# Patient Record
Sex: Female | Born: 1955 | Race: White | Hispanic: No
Health system: Midwestern US, Community
[De-identification: ages and names within clinical notes are randomized; demographics above are authoritative.]

## PROBLEM LIST (undated history)

## (undated) DIAGNOSIS — B019 Varicella without complication: Secondary | ICD-10-CM

## (undated) DIAGNOSIS — F1020 Alcohol dependence, uncomplicated: Secondary | ICD-10-CM

## (undated) DIAGNOSIS — F329 Major depressive disorder, single episode, unspecified: Secondary | ICD-10-CM

## (undated) DIAGNOSIS — R32 Unspecified urinary incontinence: Secondary | ICD-10-CM

## (undated) DIAGNOSIS — T7840XA Allergy, unspecified, initial encounter: Secondary | ICD-10-CM

## (undated) DIAGNOSIS — F32A Depression, unspecified: Secondary | ICD-10-CM

## (undated) DIAGNOSIS — E079 Disorder of thyroid, unspecified: Secondary | ICD-10-CM

## (undated) DIAGNOSIS — K219 Gastro-esophageal reflux disease without esophagitis: Secondary | ICD-10-CM

## (undated) DIAGNOSIS — L309 Dermatitis, unspecified: Secondary | ICD-10-CM

## (undated) DIAGNOSIS — E785 Hyperlipidemia, unspecified: Secondary | ICD-10-CM

## (undated) HISTORY — DX: Hyperlipidemia, unspecified: E78.5

## (undated) HISTORY — DX: Disorder of thyroid, unspecified: E07.9

## (undated) HISTORY — DX: Unspecified urinary incontinence: R32

## (undated) HISTORY — DX: Allergy, unspecified, initial encounter: T78.40XA

## (undated) HISTORY — DX: Varicella without complication: B01.9

## (undated) HISTORY — DX: Alcohol dependence, uncomplicated: F10.20

## (undated) HISTORY — DX: Major depressive disorder, single episode, unspecified: F32.9

## (undated) HISTORY — DX: Gastro-esophageal reflux disease without esophagitis: K21.9

## (undated) HISTORY — DX: Dermatitis, unspecified: L30.9

## (undated) HISTORY — DX: Depression, unspecified: F32.A

---
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
## 2011-01-17 LAB — TSH WITH REFLEX: TSH: 0.94 mIU/L (ref 0.35–5.50)

## 2012-02-28 ENCOUNTER — Inpatient Hospital Stay: Admit: 2012-02-28 | Disposition: A | Discharge status: Home / Self Care | Admitting: Pulmonary Disease

## 2012-02-28 LAB — POCT TROPONIN
POC Troponin I: 0.01 ng/mL (ref 0.00–0.10)
POC Troponin I: 0.04 ng/mL (ref 0.00–0.10)

## 2012-02-28 LAB — CBC WITH AUTO DIFFERENTIAL
Absolute Eos #: 0.1 10*3/uL (ref 0.0–0.4)
Absolute Lymph #: 2.4 10*3/uL (ref 1.0–4.8)
Absolute Mono #: 0.4 10*3/uL (ref 0.1–1.2)
Basophils Absolute: 0.1 10*3/uL (ref 0.0–0.2)
Basophils: 1 % (ref 0–2)
Eosinophils %: 1 % (ref 1–4)
Hematocrit: 40.6 % (ref 36–46)
Hemoglobin: 13.6 g/dL (ref 12.0–16.0)
Lymphocytes: 27 % (ref 24–44)
MCH: 28.9 pg (ref 26–34)
MCHC: 33.6 g/dL (ref 31–37)
MCV: 86.2 fL (ref 80–100)
MPV: 9.7 fL (ref 6.0–12.0)
Monocytes: 5 % (ref 2–11)
Platelets: 280 10*3/uL (ref 140–450)
RBC: 4.71 m/uL (ref 4.0–5.2)
RDW: 13.9 % (ref 12.5–15.4)
Seg Neutrophils: 66 % (ref 36–66)
Segs Absolute: 5.9 10*3/uL (ref 1.8–7.7)
WBC: 8.9 10*3/uL (ref 3.5–11.0)

## 2012-02-28 LAB — BASIC METABOLIC PANEL
Anion Gap: 12 mmol/L (ref 8–16)
BUN: 22 mg/dL — ABNORMAL HIGH (ref 6–20)
CO2: 26 mmol/L (ref 20–31)
Calcium: 9.7 mg/dL (ref 8.6–10.4)
Chloride: 109 mmol/L (ref 98–110)
Creatinine: 0.84 mg/dL (ref 0.4–1.0)
GFR African American: >60 mL/min (ref >60)
GFR Non-African American: >60 mL/min (ref >60)
Glucose: 90 mg/dL (ref 74–106)
Potassium: 4.2 mmol/L (ref 3.5–5.1)
Sodium: 142 mmol/L (ref 136–145)

## 2012-02-28 LAB — PLATELET FUNCTION TEST
COLL/EPI Clos Time: 161 s (ref 85–172)
Collagen Adenosine-5'-Diphosphate (Adp) Time: 71 s (ref 67–112)
Platelet Function Interp: NORMAL

## 2012-02-28 LAB — CALCIUM, IONIZED: Calcium, Ionized: 4.66 mg/dL (ref 4.50–5.30)

## 2012-02-28 LAB — POCT CREATININE: POC Creatinine: 1 mg/dL (ref 0.6–1.4)

## 2012-02-28 MED ORDER — ASPIRIN 81 MG PO TBEC
81 MG | Freq: Every day | ORAL | Status: DC
Start: 2012-02-28 — End: 2012-02-28

## 2012-02-28 MED ADMIN — ondansetron (ZOFRAN) injection 4 mg: INTRAVENOUS | @ 21:00:00 | NDC 00409112012

## 2012-02-28 MED FILL — FAMOTIDINE 10 MG/ML IV SOLN: 10 MG/ML | INTRAVENOUS | Qty: 2

## 2012-02-28 MED FILL — ASPIRIN EC 81 MG PO TBEC: 81 MG | ORAL | Qty: 1

## 2012-02-28 MED FILL — ASPIRIN 300 MG RE SUPP: 300 MG | RECTAL | Qty: 1

## 2012-02-28 MED FILL — LOVENOX 40 MG/0.4ML SC SOLN: 40 MG/0.4ML | SUBCUTANEOUS | Qty: 0.4

## 2012-02-28 NOTE — ED Notes (Signed)
Responded to stroke alert. Met pt and her friend/partner?, who advised that she had called their son. 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 Provided support to pt, as well as to friend at time of assessment and when pt was in CT. They state they have no further needs at this time.
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
## 2012-02-28 NOTE — H&P (Addendum)
Internal Medicine  History and Physical    Patient:  Ashley Burgess  MRN: [id number]    CHIEF COMPLAINT:  Slurred speech/right sided weakness    History Obtained From:  patient  PCP: Luiz Blare, MD    HISTORY  Ashley Burgess is a 56 y.o. female who presents With two-week history of substernal chest tightness, no pain, intermittent, Nonradiating, with associated shortness of breath, nausea, fatigue. Patient also reports a one hour prior to arrival having garbled/Slurred speech stating that she was only able to get out 3 words per sentence. Patient's next partner in the room confirms patient's speech is not normal. Patient also reports approximately one hour prior to arrival during the onset of the garbled speech that she felt weakness and both legs and was unable to stand. Patient reports that her speech is slightly improved in str ength has returned to her legs.  She has been having intermittent chest pressure on daily basis for the past two weeks. There is no headache or focal motor, sensory, bulbar or visual complaint . Patient does take daily baby aspirin . BP 131/71 . Testing Head CT normal . CT perfusion normal         Past Medical History:        Diagnosis Date   ??? Hyperlipidemia    ??? Depression    ??? Thyroid disease        Past Surgical History:    History reviewed. No pertinent past surgical history.    Medications Prior to Admission:    Prior to Admission medications    Medication Sig Start Date End Date Taking? Authorizing Provider   aspirin 81 MG tablet Take 81 mg by mouth daily.   Yes Historical Provider, MD   escitalopram (LEXAPRO) 20 MG tablet Take 20 mg by mouth daily.   Yes Historical Provider, MD       Allergies:  Review of patient's allergies indicates no known allergies.    Social History:   TOBACCO:   has no tobacco history on file.  ETOH:   reports that she drinks about 0.6 ounces of alcohol per week.  OCCUPATION:      Family History:       Problem Relation Age of Onset   ??? Stroke  Father    ??? Stroke Paternal Aunt        REVIEW OF SYSTEMS:  Review of Systems   Constitutional: Negative for fever and diaphoresis.   HENT: Negative for ear pain, congestion, sore throat, rhinorrhea, neck pain and tinnitus.   Eyes: Negative for pain, discharge and visual disturbance.   Respiratory: Positive for chest tightness. Negative for cough, shortness of breath and wheezing.   Cardiovascular: Negative for chest pain and palpitations.   Gastrointestinal: Positive for nausea and vomiting. Negative for abdominal pain, diarrhea and constipation.   Genitourinary: Negative for dysuria, hematuria, vaginal bleeding and vaginal discharge.   Musculoskeletal: Negative for back pain and arthralgias.   Skin: Negative for pallor and rash.   Neurological: Positive for speech difficulty and weakness. Negative for dizziness, numbness and headaches.   Psychiatric/Behavioral: Negative for hallucinations and confusion      Physical Exam:    Vitals: BP 134/73   Pulse 59   Temp(Src) 98.1 ??F (36.7 ??C) (Oral)   Resp 16   Ht 5\' 6"  (1.676 m)   Wt 198 lb 6.6 oz (90 kg)   BMI 32.04 kg/m2   SpO2 97%  Neurological Examination   Constitutional .General exam  well groomed   Ears /Nose/Throat: external ear .Normal exam   Neck and thyroid .Normal size   Respiratory .Breathsounds clear bilaterally   Cardiovascular: Auscultation of heart with regular rate and rhythm and normal heart sounds   Musculoskeletal. Muscle bulk and tone normal   Muscle strength full motor exam on full effort otherwise with some mild giveway in bilateral lower extremities   No dysmetria or dysdiadokinesis   No tremor   Normal fine motor   Orientation Alert and oriented x 3   Language process and speech normal . No aphasia   Cranial nerve 2 normal acuety and visual fields   Cranial nerve 3, 4 and 6 .Extraocular muscles are intact . Pupils are equal and reactive   Cranial nerve 5 .Normal strength of masseter and temporalis . Intact corneal reflex. Normal facial sensation    Cranial nerve 7 normal exam   Cranial nerve 8. Grossly intact hearing   Cranial nerve 9 and 10. Symmetric palate elevation   Cranial nerve 11 , 5 out of 5 strength   Cranial Nerve 12 midline tongue . No atrophy   Sensation .Normal proprioception . Intact Vibration . Normal pinprick and light touch   Deep Tendon Reflexes normal   Plantar response flexor bilaterally      CBC:   Recent Labs      02/28/12   1618   WBC  8.9   HGB  13.6   PLT  280     BMP:    Recent Labs      02/28/12   1549  02/28/12   1618   NA   --   142   K   --   4.2   CL   --   109   CO2   --   26   BUN   --   22*   CREATININE  1.0  0.84   GLUCOSE   --   90     Troponin:   Recent Labs      02/28/12   1535  02/28/12   1629  02/28/12   1933   TROPONINI  0.01  0.04  <0.01     BNP:   Recent Labs      02/28/12   1933   BNP  26       Assessment and Plan   Generalized weakness/Speech disturbance- r/o TIA  Chest pain  R/o ACS      Plan -  MRI of Head  Await CTA of head and neck report  Cardiac 2 D echo   EEG  Lipid profile, ESR   Started on ASA/statin  Cardiology consultation per neurology  Fall/seizure precautions  Serial neurochecks  Bedside swallow eval,  Advance diet as tolerated  Keep SBP between 140-180 mm Hg.  Will continue to follow      Ferdie Ping, MD    Attending Physician Statement  I have discussed the care of Ashley Burgess, including pertinent history and exam findings with the resident. I have reviewed the key elements of all parts of the encounter with the resident. I have seen and examined the patient with the resident.  I agree with the assessment and plan and status of the problem list as documented.  CT brain, MRI and carotid scans results seen  Neurology note seen  Currently no neuro deficit on exam and no localizing signs or symptoms  Addiitionally I recommend:  ?? DC NPO  ?? Ambulate and PT  ??  Follow up echo, she is seen by cardiology for chest heaviness on exertion and will follow up the results  ?? Likely DC if cardiology and  neurology not planning any intervention and if no further work up by cardiology    Luan Moore, MD  02/29/2012 11:59 PM

## 2012-02-28 NOTE — Progress Notes (Signed)
Paged to visit per pt's request. This chaplain had seen pt in ED. Pt talked about what happened today, as well as her family situation, etc. Provided support, presence, and prayer. Pt asked for Bible, will chaplain will deliver. Also asked chaplain to all her pastor (at [facility] [facility] [employer], [place]), which chaplain will do.

## 2012-02-28 NOTE — H&P (Signed)
Internal Medicine  History and Physical    Patient:  Ashley Burgess  MRN: [id number]    CHIEF COMPLAINT:  Weakness and slurred speech    History Obtained From:  patient  PCP: Luiz Blare, MD    HISTORY OF PRESENT ILLNESS:   The patient is a 56 y.o. female who presents with weakness and slurred speech, which occurred at around [facility] today. She has had a 3-4 week history of fatigue, shortness of breath, and pressure in her chest. The pressure presents after exertion (climbing 2 flights of stairs) and resolves upon rest. She has noticed the chest pressure and SOB increase in frequency over the past few weeks. Today, she experienced sudden bilateral muscle weakness and slurring of speech. She denies asymmetrical dropping of her mouth, and describes the slurring as more of a slowing in her speech. Denies feeling a noticeable difference in strength of R vs L side. Patient is left-handed. Denies numbness/tingling.    Patient has never smoked. Drinks 1-2 glasses of wine each night.    Does not report having a history of heart disease, and was last seen by her PCP 3-4 weeks ago for a routine checkup.    Reports that her father had "mini-strokes" and her mother died at a young age.    Patient works as a Programme researcher, broadcasting/film/video at [employer].    Currently takes 5 medications at home: baby aspirin, simvastatin, synthroid, concerta (for adhd), and lexapro.    Denies fever/HA/dizziness/nausea/vomiting/vision changes.    Reports no past history of diabetes.    Pt is NPO.    Past Medical History:        Diagnosis Date   ??? Hyperlipidemia    ??? Depression    ??? Thyroid disease        Past Surgical History:    History reviewed. No pertinent past surgical history.    Medications Prior to Admission:    Prior to Admission medications    Medication Sig Start Date End Date Taking? Authorizing Provider   aspirin 81 MG tablet Take 81 mg by mouth daily.   Yes Historical Provider, MD   escitalopram (LEXAPRO) 20 MG tablet Take 20 mg  by mouth daily.   Yes Historical Provider, MD       Allergies:  Review of patient's allergies indicates no known allergies.    Social History:   TOBACCO:   has no tobacco history on file.  ETOH:   reports that she drinks about 0.6 ounces of alcohol per week.  OCCUPATION:      Family History:       Problem Relation Age of Onset   ??? Stroke Father    ??? Stroke Paternal Aunt        REVIEW OF SYSTEMS:  Negative except for chief complaint.    Physical Exam:    Vitals: BP 134/73   Pulse 59   Temp(Src) 98.1 ??F (36.7 ??C) (Oral)   Resp 16   Ht 5\' 6"  (1.676 m)   Wt 198 lb 6.6 oz (90 kg)   BMI 32.04 kg/m2   SpO2 97%  General appearance: alert, appears stated age and cooperative  Skin: Skin color, texture, turgor normal. No rashes or lesions  HEENT: Head: Normocephalic, no lesions, without obvious abnormality.  Neck: no adenopathy, no carotid bruit, no JVD, supple, symmetrical, trachea midline and thyroid not enlarged, symmetric, no tenderness/mass/nodules  Lungs: clear to auscultation bilaterally  Heart: regular rate and rhythm, S1, S2 normal, no murmur,  click, rub or gallop  Abdomen: soft, non-tender; bowel sounds normal; no masses,  no organomegaly  Extremities: extremities normal, atraumatic, no cyanosis or edema  Neurologic: Mental status: Alert, oriented, thought content appropriate. Cranial nerves II-XII grossly intact. No facial asymmetry or droop. Upper and lower extremity strength 5/5 on the L, and slightly weaker on the R. Patellar reflex slightly hyperreflexive (3/4) on the L. Upper and lower extremity DTRs are otherwise WNL. Babinski sign WNL bilaterally. Rapid alternating movements of hands WNL.     CBC:   Recent Labs      02/28/12   1618   WBC  8.9   HGB  13.6   PLT  280     BMP:    Recent Labs      02/28/12   1549  02/28/12   1618   NA   --   142   K   --   4.2   CL   --   109   CO2   --   26   BUN   --   22*   CREATININE  1.0  0.84   GLUCOSE   --   90     Hepatic: No results found for this basename: AST, ALT,  ALB, BILITOT, ALKPHOS,  in the last 72 hours  Troponin:   Recent Labs      02/28/12   1535  02/28/12   1629   TROPONINI  0.01  0.04     BNP: No results found for this basename: BNP,  in the last 72 hours  Lipids: No results found for this basename: CHOL, HDL, LDLCALCU,  in the last 72 hours  ABGs:   No results found for this basename: PHART, PO2ART, PCO2ART     INR: No results found for this basename: INR,  in the last 72 hours  -----------------------------------------------------------------  PA/lat CXR:  CTA Head & Neck W WO Contrast 02/28/12:  Neck: Both common carotid, internal and external carotid   arteries are normal with no evidence of focal stenosis, aneurysm or dissection. Note is made of bilateral hairpin configuration of the mid segment of the internal carotid arteries which are markedly tortuous, left worse than right. The intracranial segments are unremarkable. Both vertebral arteries are symmetric and normal in caliber.    CTA brain: The anterior, middle and posterior cerebral arteries are normal. Normal basilar artery. No peripheral vascular abnormalities identified.    Thyroid, submandibular and parotid glands are normal. The retropharyngeal and parapharyngeal spaces unremarkable. The nasopharynx and oropharynx are normal. Both lung apices are clear. No bony pathology.    IMPRESSION: Unremarkable examination. No stenosis, dissection, thrombus or aneurysm involving the carotid and intracranial circulation    Portable CXR 02/28/12:  The heart is not enlarged. Mediastinum appears within normal limits. There is no significant vascular congestion, focal lung consolidation, pleural effusion, or pneumothorax. Bones and soft tissues about the chest appear grossly unremarkable.    IMPRESSION:   No radiographic evidence of acute cardiopulmonary disease.    CT Brain perfusion 02/28/12:  Noncontrast CT: There is no evidence for shift of midline structures, mass effect or compression of the ventricles noted. No  acute intra-or extra-axial hemorrhage is seen. No abnormal fluid collections are identified. The basal cisterns are patent. Posterior fossa structures demonstrate no evidence for mass lesions.    CT perfusion: Perfusion parametric maps demonstrate no definite evidence for elevated mean transit time, decreased cerebral blood flow or decreased cerebral blood volume.  IMPRESSION: No definite evidence for acute ischemic infarct noted. No definite evidence for a salvageable penumbra seen. No evidence for intracranial hemorrhage seen.    CT Head WO contrast 02/28/12: in process      Assessment and Plan   Assessment:  1. TIA  2. Hyperlipidemia  3. Hx of adhd and depression    Plan:  1. Monitor for signs of stroke: neuro checks   2. Trend troponins/CK-MB  3. Echo  4. F/u CT head w/o contrast  5. Anticoagulation  6. MRI brain  7. Consult cardiology and neurology  8. Lipid panel  9. HbA1C  10. Sed rate  11. CBC and BMP  12. Nursing swallow test  13. Seizure precautions      Regina Eck, Medical Student

## 2012-02-28 NOTE — Consults (Signed)
The condition is 56 yo wf with weakness, slurring along with fatigue. 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 She by 3 PM developed some slowness and slurring of her speech with slow gait with short steps brought to [facility]. Patient reports also some chest pressure. She has been having intermittent chest pressure on daily basis for the past two weeks. There is no headache or focal motor, sensory, bulbar or visual complaint . Patient does take daily baby aspirin . BP 131/71 . Testing Head CT normal . CT perfusion normal      Past Medical History   Diagnosis Date   ??? Hyperlipidemia        History reviewed. No pertinent past surgical history.    Family History   Problem Relation Age of Onset   ??? Stroke Father    ??? Stroke Paternal Aunt        History     Social History   ??? Marital Status: Married     Spouse Name: N/A     Number of Children: N/A   ??? Years of Education: N/A     Social History Main Topics   ??? Smoking status: None   ??? Smokeless tobacco: None   ??? Alcohol Use: 0.6 oz/week     1 Glasses of wine per week   ??? Drug Use: No   ??? Sexually Active: Yes -- Female, Female partner(s)     Other Topics Concern   ??? None     Social History Narrative   ??? None       Current Facility-Administered Medications   Medication Dose Route Frequency Provider Last Rate Last Dose   ??? ondansetron (ZOFRAN) injection 4 mg  4 mg Intravenous Once Coralie Carpen, DO         Current Outpatient Prescriptions   Medication Sig Dispense Refill   ??? aspirin 81 MG tablet Take 81 mg by mouth daily.       ??? escitalopram (LEXAPRO) 20 MG tablet Take 20 mg by mouth daily.           No Known Allergies    Filed Vitals:    02/28/12 1516   BP: 134/77   Pulse: 64   Temp: 98.1 ??F (36.7 ??C)   Resp: 20     Admission weight: 190 lb (86.183 kg)    Neurological Examination  Constitutional .General exam well groomed   Ears /Nose/Throat: external ear .Normal exam  Neck and thyroid  .Normal size  Respiratory .Breathsounds clear bilaterally  Cardiovascular: Auscultation of heart with regular rate and rhythm and normal heart sounds  Musculoskeletal. Muscle bulk and tone normal                                                           Muscle strength full motor exam on full effort otherwise with some mild giveway in bilateral lower extremities  No dysmetria or dysdiadokinesis  No tremor   Normal fine motor  Orientation Alert and oriented x 3   Language process and speech normal . No aphasia   Cranial nerve 2 normal acuety and visual fields  Cranial nerve 3, 4 and 6 .Extraocular muscles are intact . Pupils are equal and reactive   Cranial nerve 5 .Normal strength of masseter and temporalis . Intact corneal reflex. Normal facial sensation  Cranial nerve 7 normal exam   Cranial nerve 8. Grossly intact hearing   Cranial nerve 9 and 10. Symmetric palate elevation   Cranial nerve 11 , 5 out of 5 strength   Cranial Nerve 12 midline tongue . No atrophy  Sensation .Normal proprioception . Intact Vibration . Normal pinprick and light touch   Deep Tendon Reflexes normal  Plantar response flexor bilaterally    Assessment :    Generalized weakness , speech disturbance, lethargy along with chest pain. Rule out TIA although suspect lower incidence with more diffuse nonfocal complaints      Plan:    MRI of Head, EEG . Await CTA of head and neck report. Cardiac 2 D echo . Lipid profile, ESR . Cardiology consultation . ASA 325 mg po qd

## 2012-02-28 NOTE — ED Notes (Signed)
Pt back from CT.    Araceli Bouche, RN  02/28/12 1622

## 2012-02-28 NOTE — ED Notes (Signed)
Stroke alert called    Belva ChimesMonica M Gotti Alwin, RN  02/28/12 1537

## 2012-02-28 NOTE — Plan of Care (Signed)
Problem: Falls - Risk of  Intervention: Fall risk assessment  C/o weakness generalized and rt hand more so than left    Goal: Absence of falls  Outcome: Ongoing  Instructed on fall risk and to use help    Problem: Risk for Impaired Skin Integrity  Goal: Tissue integrity - skin and mucous membranes  Structural intactness and normal physiological function of skin and  mucous membranes.   Intervention: SKIN ASSESSMENT  No breakdown or open areas noted

## 2012-02-28 NOTE — Procedures (Signed)
Nursing Swallow Screen      **Complete swallow screen BEFORE any PO intake, including medications**      Have patient in highest tolerable sitting position.  Have oral suction equipment available at bedside.  Explain testing and rationale to patient.       Is the patient alert and can follow simple commands? y If NO stop   Does the patient have a clear strong voice and can vocalize on command? y If NO stop   Is the patient's speech SEVERELY slurred or garbled? n If YES stop   Can the patient voluntarily cough two times? y If NO stop   Is the patient able to maintain own secretions? y If NO stop   Is the patient is able to swallow 60ml of water without throat clearing, choking, gurgling, coughing, dribbling, or drooling? n If NO stop   Is the patient able to swallow 60ml of water from a STRAW without throat clearing, choking, gurgling, coughing, dribbling, or drooling?  If NO stop       If patient is unable to complete swallow screen successfully, patient should remain NPO until further swallow evaluation is completed by speech therapy.    If patient is able to complete screen, notify physician to obtain diet and PO medication orders.         Result:failed

## 2012-02-28 NOTE — ED Provider Notes (Signed)
[facility]  eMERGENCY dEPARTMENT eNCOUnter  Resident    Pt Name: Ashley Burgess  MRN: [id number]  Birthdate 18-Sep-1955  Date of evaluation: 02/28/2012    CHIEF COMPLAINT       Chief Complaint   Patient presents with   ??? Chest Pain     Pt states that she is here today due to having pain in her chest for the last couple of weeks.  Pt states that she is also feeling weak. Pt is having garbled speach as well.  Pt states that the hard/garbled speach started with in the last hour.         HISTORY OF PRESENT ILLNESS    SHALENE GALLEN is a 56 y.o. female who presents With two-week history of substernal chest tightness, no pain, intermittent, Nonradiating, with associated shortness of breath, nausea, fatigue. Patient also reports a one hour prior to arrival having garbled/Slurred speech stating that she was only able to get out 3 words per sentence.  Patient's next partner in the room confirms patient's speech is not normal.  Patient also reports approximately one hour prior to arrival during the onset of the garbled speech that she felt weakness and both legs and was unable to stand.  Patient reports that her speech is slightly improved in strength has returned to her legs.      REVIEW OF SYSTEMS       Review of Systems   Constitutional: Negative for fever and diaphoresis.   HENT: Negative for ear pain, congestion, sore throat, rhinorrhea, neck pain and tinnitus.    Eyes: Negative for pain, discharge and visual disturbance.   Respiratory: Positive for chest tightness. Negative for cough, shortness of breath and wheezing.    Cardiovascular: Negative for chest pain and palpitations.   Gastrointestinal: Positive for nausea and vomiting. Negative for abdominal pain, diarrhea and constipation.   Genitourinary: Negative for dysuria, hematuria, vaginal bleeding and vaginal discharge.   Musculoskeletal: Negative for back pain and arthralgias.   Skin: Negative for pallor and rash.   Neurological: Positive  for speech difficulty and weakness. Negative for dizziness, numbness and headaches.   Psychiatric/Behavioral: Negative for hallucinations and confusion.          PAST MEDICAL HISTORY    has a past medical history of Hyperlipidemia; Depression; and Thyroid disease.    SURGICAL HISTORY     Denies surgical history    CURRENT MEDICATIONS       Current Discharge Medication List      CONTINUE these medications which have NOT CHANGED    Details   aspirin 81 MG tablet Take 81 mg by mouth daily.      escitalopram (LEXAPRO) 20 MG tablet Take 20 mg by mouth daily.             ALLERGIES      has no known allergies.    FAMILY HISTORY     has no family status information on file.    family history includes Stroke in her father and paternal aunt.    SOCIAL HISTORY      reports that she drinks about 0.6 ounces of alcohol per week. She reports that she does not use illicit drugs.    PHYSICAL EXAM     INITIAL VITALS:  height is 5\' 6"  (1.676 m) and weight is 198 lb 6.6 oz (90 kg). Her oral temperature is 98.1 ??F (36.7 ??C). Her blood pressure is 155/67 and her pulse is 60. Her  respiration is 14 and oxygen saturation is 97%.        Physical Exam   Constitutional: She is oriented to person, place, and time. She appears well-developed. No distress.   HENT:   Head: Normocephalic and atraumatic.   Eyes: EOM are normal. Pupils are equal, round, and reactive to light. Right eye exhibits no discharge. Left eye exhibits no discharge. No scleral icterus.   Neck: Normal range of motion. No JVD present. No tracheal deviation present.   Cardiovascular: Normal heart sounds.  Exam reveals no gallop and no friction rub.    No murmur heard.  Pulmonary/Chest: No respiratory distress. She has no wheezes. She has no rales. She exhibits no tenderness.   Abdominal: Soft. Bowel sounds are normal. She exhibits no distension and no mass. There is no tenderness. There is no rebound and no guarding.   Musculoskeletal: She exhibits no edema and no tenderness.    Neurological: She is alert and oriented to person, place, and time. She displays no atrophy and no tremor. No cranial nerve deficit or sensory deficit. She exhibits normal muscle tone. She displays no seizure activity. GCS eye subscore is 4. GCS verbal subscore is 5. GCS motor subscore is 6. She displays no Babinski's sign on the right side. She displays no Babinski's sign on the left side.   Slow speech noted   Skin: Skin is warm and dry. She is not diaphoretic.   Psychiatric: Her behavior is normal. Judgment normal.         DIFFERENTIAL DIAGNOSIS/MDM:   CVA  TIA  Chest pain    DIAGNOSTIC RESULTS     EKG: All EKG's are interpreted by the Emergency Department Physician who either signs or Co-signs this chart in the absence of a cardiologist.    EKG Interpretation    Interpreted by emergency department physician    Rhythm: normal sinus   Rate: normal  Axis: normal  Ectopy: none  Conduction: normal  ST Segments: normal  T Waves: normal  Q Waves: none    EKG  Impression: normal  EKG      RADIOLOGY:   I directly visualized the following  images and reviewed the radiologist interpretations:  CT BRAIN PERFUSION    Final Result:        XR CHEST PORTABLE    Final Result:        CTA HEAD NECK W WO CONTRAST    Final Result:        CTA NECK W CONTRAST    (Canceled)   CT HEAD WO CONTRAST    (Results Pending)   MRI BRAIN W WO CONTRAST    (Results Pending)       Chest portable, radiology interpretation: No radiographic evidence of acute cardiopulmonary disease.  CT brain perfusion, radiology interpretation:IMPRESSION: No definite evidence for acute ischemic infarct noted. No   definite evidence for a salvageable penumbra seen. No evidence for   intracranial hemorrhage seen.  CTA neck, radiology interpreted:IMPRESSION: Unremarkable examination. No stenosis, dissection,   thrombus or aneurysm involving the carotid and intracranial circulation    CT head without contrast interpretation included a CT perfusion  report.        LABS:  Labs Reviewed   BASIC METABOLIC PANEL - Abnormal; Notable for the following:     BUN 22 (*)     All other components within normal limits   CBC WITH AUTO DIFFERENTIAL   PLATELET FUNCTION TEST   SEDIMENTATION RATE   LIPID PANEL  HEMOGLOBIN A1C   LIPID PANEL   TROPONIN I   TROPONIN I   BRAIN NATRIURETIC PEPTIDE   CBC WITH AUTO DIFFERENTIAL   URINALYSIS WITH MICROSCOPIC   TSH   MAGNESIUM   CALCIUM, IONIZED   URINE DRUG SCREEN   CK ISOENZYMES   CK ISOENZYMES   POCT TROPONIN   POC CG8: NA, K, ICA, GLU, HCT/HGB(CALC), BLOOD GASES   POCT CREATININE   POCT TROPONIN   POCT TROPONIN   POCT CREATININE   POCT TROPONIN     Reviewed and unremarkable.   trending troponin from 0.01 to 0.04        EMERGENCY DEPARTMENT COURSE:   Vitals:    Filed Vitals:    02/28/12 1641 02/28/12 1656 02/28/12 1726 02/28/12 1834   BP: 128/79 120/72 141/75 155/67   Pulse: 59 61 62 60   Temp:    98.1 ??F (36.7 ??C)   TempSrc:    Oral   Resp: 18 23 13 14    Height:    5\' 6"  (1.676 m)   Weight:    198 lb 6.6 oz (90 kg)   SpO2:             CRITICAL CARE:      CONSULTS:  @conorders @      PROCEDURES:  None    FINAL IMPRESSION      1. CVA (cerebral infarction)    2. TIA (transient ischemic attack)          DISPOSITION/PLAN   DISPOSITION Admitted        PATIENT REFERRED TO:  No follow-up provider specified.    DISCHARGE MEDICATIONS:  Current Discharge Medication List          (Please note that portions of this note were completed with a voice recognition program.  Efforts were made to edit the dictations but occasionally words are mis-transcribed.)    Coralie Carpen  Emergency Medicine Resident              Coralie Carpen, DO  Resident  02/28/12 [phone]

## 2012-02-29 LAB — CBC WITH AUTO DIFFERENTIAL
Absolute Eos #: 0.21 10*3/uL (ref 0.0–0.4)
Absolute Lymph #: 2.56 10*3/uL (ref 1.0–4.8)
Absolute Mono #: 0.21 10*3/uL (ref 0.1–0.8)
Basophils %: 2 % (ref 0–2)
Basophils Absolute: 0 10*3/uL (ref 0.0–0.2)
Basophils Absolute: 0.1 10*3/uL (ref 0.0–0.2)
Basophils: 0 % (ref 0–2)
Eosinophils %: 3 % (ref 1–4)
Eosinophils %: 3 % (ref 1–4)
Eosinophils Absolute: 0.16 k/uL (ref 0.0–0.4)
Hematocrit: 37.2 % (ref 36–46)
Hematocrit: 37.5 % (ref 36–46)
Hemoglobin: 12.4 g/dL (ref 12.0–16.0)
Hemoglobin: 12.6 g/dL (ref 12.0–16.0)
Lymphocytes Absolute: 1.77 k/uL (ref 1.0–4.8)
Lymphocytes: 36 % (ref 24–44)
Lymphocytes: 34 % (ref 24–44)
MCH: 29.5 pg (ref 26–34)
MCH: 29 pg (ref 26–34)
MCHC: 33.3 g/dL (ref 31–37)
MCHC: 33.6 g/dL (ref 31–37)
MCV: 88.5 fL (ref 80–100)
MCV: 86.4 fL (ref 80–100)
MPV: 9.5 fL (ref 6.0–12.0)
Monocytes %: 6 % (ref 1–7)
Monocytes Absolute: 0.31 k/uL (ref 0.1–0.8)
Monocytes: 3 % (ref 1–7)
Morphology: NORMAL
Morphology: NORMAL
Neutrophils Absolute: 2.86 k/uL (ref 1.8–7.7)
Platelets: ADEQUATE 10*3/uL (ref 140–450)
Platelets: 192 10*3/uL (ref 140–450)
RBC: 4.2 m/uL (ref 4.0–5.2)
RBC: 4.34 m/uL (ref 4.0–5.2)
RDW: 14.1 % (ref 12.5–15.4)
RDW: 13.8 % (ref 12.5–15.4)
Seg Neutrophils: 58 % (ref 36–66)
Seg Neutrophils: 55 % (ref 36–66)
Segs Absolute: 4.12 10*3/uL (ref 1.8–7.7)
WBC: 7.1 10*3/uL (ref 3.5–11.0)
WBC: 5.2 10*3/uL (ref 3.5–11.0)

## 2012-02-29 LAB — BASIC METABOLIC PANEL
Anion Gap: 10 mmol/L (ref 8–16)
BUN: 18 mg/dL (ref 6–20)
CO2: 24 mmol/L (ref 20–31)
Calcium: 9.1 mg/dL (ref 8.6–10.4)
Chloride: 109 mmol/L (ref 98–110)
Creatinine: 0.75 mg/dL (ref 0.4–1.0)
GFR African American: >60 mL/min (ref >60)
GFR Non-African American: >60 mL/min (ref >60)
Glucose: 89 mg/dL (ref 74–106)
Potassium: 4 mmol/L (ref 3.5–5.1)
Sodium: 139 mmol/L (ref 136–145)

## 2012-02-29 LAB — URINE DRUG SCREEN
Amphetamine Screen, Ur: NEGATIVE
Barbiturate Screen, Ur: NEGATIVE
Benzodiazepine Screen, Urine: NEGATIVE
Cannabinoid Scrn, Ur: NEGATIVE
Cocaine Metabolite, Urine: NEGATIVE
Methadone Screen, Urine: NEGATIVE
Opiates, Urine: NEGATIVE
Oxycodone Screen, Ur: NEGATIVE
Phencyclidine, Urine: NEGATIVE

## 2012-02-29 LAB — BRAIN NATRIURETIC PEPTIDE: BNP: 26 pg/mL (ref <100)

## 2012-02-29 LAB — LIPID PANEL
Chol/HDL Ratio: 2.8 (ref <5.0)
Cholesterol: 184 mg/dL (ref <200)
HDL: 65 mg/dL (ref >40)
LDL Cholesterol: 97 mg/dL (ref <100)
Triglycerides: 112 mg/dL (ref <150)

## 2012-02-29 LAB — TROPONIN
Troponin I: <0.01 ng/mL (ref 0.00–0.04)
Troponin I: <0.01 ng/mL (ref 0.00–0.04)
Troponin I: <0.01 ng/mL (ref 0.00–0.04)

## 2012-02-29 LAB — CK ISOENZYMES
% CKMB: 1.4 % (ref 0–4)
% CKMB: 1.5 % (ref 0–4)
% CKMB: 1.8 % (ref 0–4)
CK-MB: 1.3 ng/mL (ref 0–5)
CK-MB: 1.8 ng/mL (ref 0–5)
CK-MB: 1.8 ng/mL (ref 0–5)
CKMB Interpretation: NORMAL
CKMB Interpretation: NORMAL
CKMB Interpretation: NORMAL
Total CK: 100 U/L (ref 26–140)
Total CK: 118 U/L (ref 26–140)
Total CK: 93 U/L (ref 26–140)

## 2012-02-29 LAB — URINALYSIS WITH MICROSCOPIC
Bilirubin Urine: NEGATIVE
Glucose, Ur: NEGATIVE
Leukocyte Esterase, Urine: NEGATIVE
Nitrite, Urine: NEGATIVE
Protein, UA: NEGATIVE
Specific Gravity, UA: 1.083 — ABNORMAL HIGH (ref 1.005–1.030)
Urine Hgb: NEGATIVE
Urobilinogen, Urine: NORMAL
WBC, UA: 0 /HPF (ref 0–5)
pH, UA: 5.5 (ref 5.0–8.0)

## 2012-02-29 LAB — TSH: TSH: 1.92 mIU/L (ref 0.35–5.50)

## 2012-02-29 LAB — HEMOGLOBIN A1C
Estimated Avg Glucose: 111 mg/dL
Estimated Avg Glucose: 111 mg/dL
Hemoglobin A1C: 5.5 % (ref 4.0–6.0)
Hemoglobin A1C: 5.5 % (ref 4.0–6.0)

## 2012-02-29 LAB — SEDIMENTATION RATE: Sed Rate: 10 mm (ref 0–20)

## 2012-02-29 LAB — MAGNESIUM: Magnesium: 2.2 mg/dL (ref 1.5–2.5)

## 2012-02-29 MED ADMIN — sodium chloride 0.9 % injection 10 mL: INTRAVENOUS | @ 13:00:00

## 2012-02-29 MED ADMIN — sodium chloride 0.9 % injection 10 mL: INTRAVENOUS | @ 01:00:00

## 2012-02-29 MED ADMIN — 0.9 % sodium chloride infusion: INTRAVENOUS | @ 01:00:00 | NDC 00338004904

## 2012-02-29 MED ADMIN — famotidine (PEPCID) injection 20 mg: INTRAVENOUS | @ 14:00:00 | NDC 00641602201

## 2012-02-29 MED ADMIN — gadopentetate dimeglumine (MAGNEVIST) injection 18 mL: INTRAVENOUS | @ 13:00:00 | NDC 50419018811

## 2012-02-29 MED ADMIN — famotidine (PEPCID) injection 20 mg: INTRAVENOUS | @ 01:00:00 | NDC 63323073912

## 2012-02-29 MED ADMIN — enoxaparin (LOVENOX) injection 40 mg: SUBCUTANEOUS | @ 14:00:00 | NDC 00075062040

## 2012-02-29 MED ADMIN — aspirin EC tablet 325 mg: ORAL | @ 22:00:00 | NDC 57896092110

## 2012-02-29 MED ADMIN — enoxaparin (LOVENOX) injection 40 mg: SUBCUTANEOUS | @ 01:00:00 | NDC 00075062040

## 2012-02-29 MED ADMIN — LORazepam (ATIVAN) injection 2 mg: INTRAVENOUS | @ 13:00:00 | NDC 00409677802

## 2012-02-29 MED ADMIN — citalopram (CELEXA) tablet 40 mg: ORAL | @ 14:00:00 | NDC 13668001005

## 2012-02-29 MED FILL — ASPIRIN EC 325 MG PO TBEC: 325 MG | ORAL | Qty: 1

## 2012-02-29 MED FILL — FAMOTIDINE 10 MG/ML IV SOLN: 10 MG/ML | INTRAVENOUS | Qty: 2

## 2012-02-29 MED FILL — SIMVASTATIN 20 MG PO TABS: 20 MG | ORAL | Qty: 1

## 2012-02-29 MED FILL — ASPIRIN 300 MG RE SUPP: 300 MG | RECTAL | Qty: 1

## 2012-02-29 MED FILL — LORAZEPAM 2 MG/ML IJ SOLN: 2 MG/ML | INTRAMUSCULAR | Qty: 1

## 2012-02-29 MED FILL — CITALOPRAM HYDROBROMIDE 20 MG PO TABS: 20 MG | ORAL | Qty: 2

## 2012-02-29 MED FILL — LOVENOX 40 MG/0.4ML SC SOLN: 40 MG/0.4ML | SUBCUTANEOUS | Qty: 0.4

## 2012-02-29 NOTE — Progress Notes (Signed)
Speech Language Pathology  Facility/Department: [facility]   BEDSIDE SWALLOW EVALUATION    NAME: MACKENZEY CROWNOVER  DOB: August 20, 1955  MRN: [id number]    Date of Eval: 02/29/2012  Evaluating Therapist: Agnes Lawrence    Past Medical History   Diagnosis Date   ??? Hyperlipidemia    ??? Depression    ??? Thyroid disease      Past Surgical History   Procedure Laterality Date   ??? Nasal septum surgery Bilateral        Patient Diagnosis(es): There are no active problems to display for this patient.    Allergies: No Known Allergies    Diet level prior to BSE:  NPO    Primary Complaint: admit with BLE weakness, chest pain and slurred speech       Impression:  Pt. With no overt s/s of aspiration noted with Puree, Cracker and thin and thick liquids.  Apparent functional swallow,  Recommend Regular diet with thin liquids.       Recommended Diet:  Solid: Diet Solids Recommendation: Regular  Liquid: Liquid Consistency Recommendation: Thin    Overall Recommendations  Recommendations  Requires SLP Intervention: Yes  Diet Solids Recommendation: Regular  Liquid Consistency Recommendation: Thin  Compensatory Swallowing Strategies: Alternate solids and liquids;Eat/Feed slowly;Small bites/sips;Upright as possible for all oral intake  Recommended Form of Meds: PO  Patient Education: yes  Patient Education Response: Verbalizes understanding  Time In: 0815  Time Out: 0830    Reason for Referral  ONYA EUTSLER was referred for a bedside swallow evaluation to assess the efficiency of her swallow function, rule out aspiration and make recommendations regarding safe dietary consistencies, effective compensatory strategies, and safe eating environment.    Pain:  Pain Screening  Patient Currently in Pain: No  Pain Assessment: 0-10  Pain Level: 0  Pain Type: Acute pain  Pain Location: Back  Pain Intervention(s): Repositioned    General  General  Chart Reviewed: Yes  Behavior/Cognition  Behavior/Cognition: Alert;Cooperative  Baseline  Assessment  Respiratory Status: Room air  Communication Observation: Functional  Follows Directions: Complex  Current Diet : NPO  Current Liquid Diet : NPO  Dentition: Adequate  Patient Positioning: Upright in bed  Prior Dysphagia History: none  Consistencies Trialed  Consistencies Trialed: Reg solid;Puree;Nectar - teaspoon;Thin  Patient Positioning  Patient Positioning: Upright in bed    Orientation  Orientation  Overall Orientation Status: Within Functional Limits         Oral Motor Deficits  Oral/Motor  Oral Motor: Within functional limits    Oral Phase Dysfunction  Oral Phase  Oral Phase: WNL     Indicators of Pharyngeal Phase Dysfunction   Pharyngeal Phase  Pharyngeal Phase: WNL    Prognosis  Prognosis  Prognosis for safe diet advancement: good           Treatment/Goals  Dysphagia Goals: The patient will tolerate recommended diet without observed clinical signs of aspiration;The patient will recall and perform compensatory strategies, with no cues.;The patient/caregiver will demonstrate understanding of compensatory strategies for improved swallowing safety.    Alp Goldwater  [id number]

## 2012-02-29 NOTE — Plan of Care (Signed)
Problem: Risk for Impaired Skin Integrity  Goal: Tissue integrity - skin and mucous membranes  Structural intactness and normal physiological function of skin and  mucous membranes.   Outcome: Ongoing  Skin assessed for breakdown and redness, patient turned regularly, and heels elevated off of bed on pillows.
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
## 2012-02-29 NOTE — Plan of Care (Signed)
Problem: Falls - Risk of  Goal: Absence of falls  Outcome: Ongoing  Patient call light in reach, bed in low position, wheels locked, slippers on, environment cleaned up, items in reach, lighting in room, side rails up, reminded patient to call for assistance.

## 2012-02-29 NOTE — Progress Notes (Signed)
Speech Language Pathology  Facility/Department: [facility]  Initial Assessment    NAME: Ashley Burgess  DOB: [birth year]  MRN: [id number]    Date of Eval: 02/29/2012  Evaluating Therapist: Agnes Lawrence    Past Medical History   Diagnosis Date   ??? Hyperlipidemia    ??? Depression    ??? Thyroid disease      Past Surgical History   Procedure Laterality Date   ??? Nasal septum surgery Bilateral        Patient Diagnosis(es): There are no active problems to display for this patient.      Primary Complaint: admit with BLE weakness and slurred speech    Assessment:  Cognitive Diagnosis: Mild cognitive deficit:  Pt. 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  ST to follow and further assess higher level reasoning and problem solving skills.     Recommendations  Requires SLP Intervention: Yes  Patient Education: yes  Patient Education Response: Verbalizes understanding  Time In: 0830  Time Out: 0840  Speech Therapy Prognosis  Prognosis: Excellent    Subjective:   Previous level of function and limitations: independent, works as Engineer, production  Chart Reviewed: Yes  Pain Screening  Patient Currently in Pain: No  Pain Assessment: 0-10  Pain Level: 0  Pain Type: Acute pain  Pain Location: Back  Pain Intervention(s): Repositioned     Orientation  Overall Orientation Status: Within Normal Limits                 Objective:     Oral/Motor  Oral Motor: Within functional limits                       Pragmatics/Social Functioning  Pragmatics: Within functional limits  Cognition  Orientation Level: Oriented X4  Attention  Attention: Within Functional Limits  Memory  Memory: Within Funtional Limits  Problem Solving  Problem Solving: Within Functional Limits  Abstract Reasoning  Abstract Reasoning: Within Functional Limits  Safety/Judgement  Safety/Judgement: Within Functional Limits      Goals:   Short-term Goals  Goal 1: Pt .will  complete high level reasoning and problem solving tasks with 90% acc.  Speech Therapy Prognosis  Prognosis: Excellent  Recommendations  Requires SLP Intervention: Yes  Patient Education: yes  Patient Education Response: Verbalizes understanding  Time In: 0830  Time Out: 0840  Requires SLP Intervention: Yes  Patient/family involved in developing goals and treatment plan: yes          Pressley Barsky  [id number]

## 2012-02-29 NOTE — Progress Notes (Signed)
Physical Therapy  Initial Assessment    Date: 02/29/2012  Patient Name: Ashley AllegraSusan M Jacot  MRN: [id number]    DOB: 29-Apr-1956    Chief Complaint   Patient presents with   ??? Chest Pain     Pt states that she is here today due to having pain in her chest for the last couple of weeks.  Pt states that she is also feeling weak. Pt is having garbled speach as well.  Pt states that the hard/garbled speach started with in the last hour.     Past Medical History   Diagnosis Date   ??? Hyperlipidemia    ??? Depression    ??? Thyroid disease      Past Surgical History   Procedure Laterality Date   ??? Nasal septum surgery Bilateral      Restrictions  Restrictions/Precautions: Fall Risk  Vision/Hearing  Vision: Within Functional Limits  Hearing: Within functional limits     Subjective  Follows Commands: Within Functional Limits  Subjective: The pt reports still feeling lethargic and "swimmy" from the Ativan she received earlier for MRI.   Patient Currently in Pain: No  Orientation  Overall Orientation Status: Within Normal Limits  Home Living  Lives With: Spouse  Type of Home: House  Home Layout: Two level;Bed/Bath upstairs  Home Access: Stairs to enter with rails  Home Equipment: Cane  Objective  Prior Function  ADL Assistance: Independent  Homemaking Assistance: Independent  Ambulation Assistance: Independent  Transfer Assistance: Independent    RLE AROM: WFL  LLE AROM: WFL  RUE AROM: WFL  LUE ROM: WFL    Strength RLE: WFL (Grossly 5/5)  Strength LLE: WFL (Grossly 5/5)  Strength RUE: WFL (Grossly 5/5)  Strength LUE: WFL (Grossly 5/5)     Sensation  Overall Sensation Status: WNL  Bed Mobility  Bridging: Stand by assistance  Rolling: Stand by assistance  Supine to Sit: Stand by assistance  Sit to Supine: Stand by assistance  Scooting: Stand by assistance  Transfers  Sit to Stand: Contact guard assistance  Stand to sit: Contact guard assistance  Ambulation 1  Surface: level tile  Device: Rolling Walker  Assistance: Stand by  assistance;Contact guard assistance  Quality of Gait: Slow cadence with short step length. The pt reports feeling a little off balance, therefore walker used today.  Distance: 3700ft     Balance  Sitting - Static: Good  Sitting - Dynamic: Good  Standing - Static: Fair  Standing - Dynamic: Fair     Assessment   Assessment: Decreased high-level IADLs  Prognosis: Good  Discharge Recommendations: Home with assist PRN (assuming that the pt acheives the above goals)  Requires PT Follow Up: Yes  Time In: 1410  Time Out: 1440  Activity Tolerance: Patient Tolerated treatment well       Plan   Times per week: 1-2x/day, 5-6x/week   Current Treatment Recommendations: Balance Training;Functional Mobility Training;Gait Training;Stair training;Neuromuscular Re-education;Safety Education & Training  Patient Education: PT POC    Goals  Short term goals  Time Frame for Short term goals: 10 visits  Short term goal 1: Pt to perform transfers independently.  Short term goal 2: Pt to ambulate 33000ft without a device independently.  Short term goal 3: Pt to negotiate 8 steps with 1 railing independently.  Short term goal 4: Pt to improve standing balance to good for safe gait and transfers.        Jeralene Huffracy Radecki, PT
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
## 2012-02-29 NOTE — Progress Notes (Signed)
Dr. Jeanelle Malling meets with pt, reviews pt's MRI and CTA of head and neck results with her. Discusses 2d echo still pending read. Writer provides stroke prevention/[employer] for Stroke Prevent on brochure, FAST magnet for s/s of stroke and importance of wasting no time in seeking EMS assistance. Pt verbalizes understanding of same, appreciative for information shared. Cholesterol results also discussed.

## 2012-02-29 NOTE — Progress Notes (Signed)
Physical Therapy             DATE: 02/29/2012    NAME: Ashley Burgess  MRN: [id number]   DOB: 1956/06/25    Patient not seen this date for Physical Therapy due to:  []  Blood transfusion in progress  []  Cancel by RN  []  Hemodialysis  []   Refusal by Patient   []  Spine Precautions   []  Strict Bedrest  []  Surgery  [x]  Testing: pt given Ativan for MRI      []  Other        []  PT being discontinued at this time. Patient independent. No further needs.   []  PT being discontinued at this time as the patient has been transferred to palliative care. No further needs.    Jeralene Huffracy Radecki, PT

## 2012-02-29 NOTE — Progress Notes (Signed)
Occupational Therapy  [facility]  Occupational Therapy Not Seen Note    Patient not available for Occupational Therapy due to:    []  Testing:    []  Hemodialysis    []  Cancelled by RN:    [] Refusal by Patient:    []  Surgery - Patient placed on Hold   []  No   []  Yes:    []  Intubation     []  Pain Medication    [x]  Sedation: Ativan on board; Pt not responsive per RN- [id number]     []  Spine Precautions    []  Medical Instability    []  Other _________    Next Scheduled Treatment: 03/01/12    Signature: Note completed by Princella Ion, OTS. Cosigned by Corrie Dandy Hypio, OTR/L

## 2012-02-29 NOTE — Progress Notes (Signed)
Neurosurgery Resident    02/29/2012  7:33 AM    Chart reviewed.  No acute events overnight.  No new complaints. Feels better than yesterday. Weakness resolving. Speech resolving. Confusion resolved.     Filed Vitals:    02/29/12 0200 02/29/12 0300 02/29/12 0400 02/29/12 0500   BP:  109/55  123/61   Pulse: 49 49 52 56   Temp:  97.2 ??F (36.2 ??C)     TempSrc:       Resp: 13 13 14 17    Height:       Weight:       SpO2: 97% 96% 98% 96%       AOx3  CNII-XII intact  PERRLA, EOMI  CVS: RRR  Resp: CTA b/l  5/5 strength  Normal sensation to light touch    Lab Results   Component Value Date    WBC 7.1 02/29/2012    HGB 12.4 02/29/2012    HCT 37.2 02/29/2012    PLT Pending 02/29/2012    NA 142 02/28/2012    K 4.2 02/28/2012    CL 109 02/28/2012    CREATININE 0.84 02/28/2012    BUN 22* 02/28/2012    CO2 26 02/28/2012    TSH 1.92 02/28/2012         Radiology  CTA Head neck- unremarkable study   MRI Head 02/29/12- Oval-shaped CSF intensity structure noted with peripheral   hyperintense rim in the frontal horn of the right lateral ventricle.   Differential considerations include neuroepithelial cyst, epididymal   cyst, arachnoid or colloid cyst.. No associated enhancement is seen.   Please consider 6 month followup assessment  2. No acute or subacute ischemic insult noted.        Gillermina Hu, MD  7:33 AM    A/P  Genralized weakness with speech disturbance -  Atypical chest pain- cardiology consult.

## 2012-02-29 NOTE — Progress Notes (Signed)
IM RESIDENT PROGRESS NOTE        Patient:  Ashley Burgess  Date of Birth: 06-11-1956    MRN: [id number]     Acct: [id number]     Admit date: 02/28/2012    Subjective: Pt seen and Chart reviewed.  Patient reports feeling better. Denies any new symptoms, but did indicate that at one point she felt some transient tingling on her R arm, and still notices slight heaviness of her L upper extremity. Strength and sensation tests appeared to be WNL.    Pt denies fever/HA/dizziness/nausea/vomiting/abdominal pain. Is producing urine.    Nurse reports some transient bradycardia during the night (lowest HR = 38). Patient was not given anything and HR returned back to 60s range.    Diet:  NPO      Medications:Current Inpatient    Scheduled Meds:     ??? lorazepam  2 mg Intravenous Once   ??? citalopram  40 mg Oral Daily   ??? sodium chloride  10 mL Intravenous Q12H SCH   ??? enoxaparin (LOVENOX) injection  40 mg Subcutaneous Daily   ??? famotidine  20 mg Intravenous BID   ??? simvastatin  20 mg Oral Nightly   ??? aspirin  300 mg Rectal Daily     Continuous Infusions:     ??? sodium chloride 75 mL/hr at 02/28/12 2036     PRN Meds:sodium chloride, acetaminophen, magnesium hydroxide, ondansetron    Objective:    Physical Exam:  Vitals: BP 123/61   Pulse 56   Temp(Src) 97.2 ??F (36.2 ??C) (Oral)   Resp 17   Ht 5\' 6"  (1.676 m)   Wt 198 lb 6.6 oz (90 kg)   BMI 32.04 kg/m2   SpO2 96%  24 hour intake/output:  Intake/Output Summary (Last 24 hours) at 02/29/12 0918  Last data filed at 02/29/12 0600   Gross per 24 hour   Intake    739 ml   Output    400 ml   Net    339 ml     Last 3 weights:  Wt Readings from Last 3 Encounters:   02/28/12 198 lb 6.6 oz (90 kg)       Physical Examination:   General appearance - alert, well appearing, and in no distress  Mental status - alert, oriented to person, place, and time  Eyes - pupils equal and reactive, extraocular eye movements intact  Ears -  bilateral TM's and external ear canals normal  Nose - normal and patent, no erythema, discharge or polyps  Mouth - mucous membranes moist, pharynx normal without lesions  Neck - supple, no significant adenopathy  Chest - clear to auscultation, no wheezes, rales or rhonchi, symmetric air entry  Heart - normal rate, regular rhythm, normal S1, S2, no murmurs, rubs, clicks or gallops  Abdomen - soft, nontender, nondistended, no masses or organomegaly  Neurological - alert, oriented, normal speech. Cranial nerves II-XII grossly intact. Upper and lower extremity strength 5/5 bilaterally on exam (patient notes slight weakness on R compared to L). Rapid alternating hand movements WNL.  Extremities - peripheral pulses normal, no pedal edema, no clubbing or cyanosis  Skin - normal coloration and turgor, no rashes, no suspicious skin lesions noted     Labs:-    CBC:   Recent Labs      02/28/12   1618  02/29/12   0601   WBC  8.9  7.1   HGB  13.6  12.4   PLT  280  Platelet clumps present, count appears adequate.     BMP:  Recent Labs      02/28/12   1549  02/28/12   1618   NA   --   142   K   --   4.2   CL   --   109   CO2   --   26   BUN   --   22*   CREATININE  1.0  0.84   GLUCOSE   --   90     Calcium:  Recent Labs      02/28/12   1618   CALCIUM  9.7     Ionized Calcium:No results found for this basename: IONCA,  in the last 72 hours  Magnesium:  Recent Labs      02/28/12   1933   MG  2.2     Phosphorus:No results found for this basename: PHOS,  in the last 72 hours  BNP:  Recent Labs      02/28/12   1933   BNP  26     Glucose:No results found for this basename: POCGLU,  in the last 72 hours  HgbA1C: No results found for this basename: LABA1C,  in the last 72 hours  INR: No results found for this basename: INR,  in the last 72 hours  Hepatic: No results found for this basename: ALKPHOS, ALT, AST, PROT, BILITOT, BILIDIR, LABALBU,  in the last 72 hours  Amylase and Lipase:No results found for this basename: LACTA, AMYLASE,  in  the last 72 hours  Lactic Acid: No results found for this basename: LACTA,  in the last 72 hours  CARDIAC ENZYMES:  Recent Labs      02/28/12   1933  02/29/12   0127  02/29/12   0601   CKTOTAL  118  100  93   CKMB  1.8  1.8  1.3   TROPONINI  <0.01  <0.01  <0.01     BNP:   Recent Labs      02/28/12   1933   BNP  26     Lipids:   Recent Labs      02/29/12   0601   CHOL  184   TRIG  112   HDL  65     ABGs:   No results found for this basename: PH, PCO2, PO2, HCO3, O2SAT     Thyroid:   Lab Results   Component Value Date    TSH 1.92 02/28/2012      Urinalysis: Color, UA   Date Value Range Status   02/28/2012 YELLOW  YEL Final        pH, UA   Date Value Range Status   02/28/2012 5.5  5.0 - 8.0 Final        Specific Gravity, UA   Date Value Range Status   02/28/2012 1.083* 1.005 - 1.030 Final        Protein, UA   Date Value Range Status   02/28/2012 NEGATIVE  NEG Final        RBC, UA   Date Value Range Status   02/28/2012 None  0 - 2 /HPF Final        Bacteria, UA   Date Value Range Status   02/28/2012 NOT REPORTED  NONE Final        Nitrite, Urine   Date Value Range Status   02/28/2012 NEGATIVE  NEG Final  WBC, UA   Date Value Range Status   02/28/2012 0 TO 2  0 - 5 /HPF Final        Leukocyte Esterase, Urine   Date Value Range Status   02/28/2012 NEGATIVE  NEG Final        Yeast, UA   Date Value Range Status   02/28/2012 NOT REPORTED  NONE Final        Glucose, Ur   Date Value Range Status   02/28/2012 NEGATIVE  NEG Final        Bilirubin Urine   Date Value Range Status   02/28/2012 NEGATIVE  NEG Final       Imaging:  CTA Head & Neck W WO Contrast 02/28/12:   Neck: Both common carotid, internal and external carotid   arteries are normal with no evidence of focal stenosis, aneurysm or dissection. Note is made of bilateral hairpin configuration of the mid segment of the internal carotid arteries which are markedly tortuous, left worse than right. The intracranial segments are unremarkable. Both vertebral arteries are symmetric and  normal in caliber.    CTA brain: The anterior, middle and posterior cerebral arteries are normal. Normal basilar artery. No peripheral vascular abnormalities identified.    Thyroid, submandibular and parotid glands are normal. The retropharyngeal and parapharyngeal spaces unremarkable. The nasopharynx and oropharynx are normal. Both lung apices are clear. No bony pathology.    IMPRESSION: Unremarkable examination. No stenosis, dissection, thrombus or aneurysm involving the carotid and intracranial circulation    Portable CXR 02/28/12:   The heart is not enlarged. Mediastinum appears within normal limits. There is no significant vascular congestion, focal lung consolidation, pleural effusion, or pneumothorax. Bones and soft tissues about the chest appear grossly unremarkable.    IMPRESSION:   No radiographic evidence of acute cardiopulmonary disease.    CT Brain perfusion 02/28/12:   Noncontrast CT: There is no evidence for shift of midline structures, mass effect or compression of the ventricles noted. No acute intra-or extra-axial hemorrhage is seen. No abnormal fluid collections are identified. The basal cisterns are patent. Posterior fossa structures demonstrate no evidence for mass lesions.    CT perfusion: Perfusion parametric maps demonstrate no definite evidence for elevated mean transit time, decreased cerebral blood flow or decreased cerebral blood volume.    IMPRESSION: No definite evidence for acute ischemic infarct noted. No definite evidence for a salvageable penumbra seen. No evidence for intracranial hemorrhage seen.    CT Head WO contrast 02/28/12: in process  MRI Brain W WO contrast 02/29/12:   Findings: There is no evidence for shift of midline structures, mass effect or compression of the ventricles. No acute intra-or extra-axial hemorrhage is seen. No abnormal fluid collections are identified. Basal cisterns are patent. Posterior fossa structures demonstrate no evidence for mass lesions.    No  abnormal enhancing intracranial mass lesions are seen.    Oval-shaped 1.6 cm CSF intensity structure noted with the peripheral hyperintensity in the right lateral ventricle involving the frontal horn may represent a small neuroepithelial cyst, ependymal cyst or arachnoid cyst. No enhancing component is seen nonetheless there is significant motion artifact the which limits evaluation. Please consider short-term followup examination    No acute or subacute ischemic insult noted. No abnormal enhancing intracranial mass seen.    No evidence for cerebellar tonsillar herniation is seen.    IMPRESSION: Oval-shaped CSF intensity structure noted with peripheral hyperintense rim in the frontal horn of the right lateral ventricle.   Differential  considerations include neuroepithelial cyst, epididymal cyst, arachnoid or colloid cyst.. No associated enhancement is seen.   Please consider 6 month followup assessment  No acute or subacute ischemic insult noted.      Assessment:   1. TIA (slurred speech and slight L sided weakness)  2. Hyperlipidemia (pt taking simvastatin; lipid panel is WNL)  3. Hx of adhd and depression (pt taking celexa and concerta)  4. Hypothyroidism (pt taking synthroid; TSH WNL)    Plan:   1. Monitor for signs of stroke: neuro checks   2. Trend troponins/CK-MB (currently WNL)  3. F/u Echo (rule out cardiogenic causes)  4. F/u CT head w/o contrast   5. Anticoagulation   6. F/u MRI brain   7. F/u consult cardiology and neurology   8. Lipid panel   9. F/u HbA1C  10. F/u Sed rate   11. Monitor CBC and BMP   12. Nursing swallow test   13. Seizure precautions       Regina Eck, Medical Student  02/29/2012, 9:18 AM

## 2012-03-01 LAB — CBC WITH AUTO DIFFERENTIAL
Absolute Eos #: 0.3 10*3/uL (ref 0.0–0.4)
Absolute Lymph #: 2.3 10*3/uL (ref 1.0–4.8)
Absolute Mono #: 0.4 10*3/uL (ref 0.1–1.2)
Basophils Absolute: 0.1 10*3/uL (ref 0.0–0.2)
Basophils: 1 % (ref 0–2)
Eosinophils %: 4 % (ref 1–4)
Hematocrit: 38.1 % (ref 36–46)
Hemoglobin: 12.9 g/dL (ref 12.0–16.0)
Lymphocytes: 33 % (ref 24–44)
MCH: 29.4 pg (ref 26–34)
MCHC: 33.9 g/dL (ref 31–37)
MCV: 86.9 fL (ref 80–100)
MPV: 9.1 fL (ref 6.0–12.0)
Monocytes: 7 % (ref 2–11)
Platelets: 228 10*3/uL (ref 140–450)
RBC: 4.39 m/uL (ref 4.0–5.2)
RDW: 13.9 % (ref 12.5–15.4)
Seg Neutrophils: 55 % (ref 36–66)
Segs Absolute: 3.7 10*3/uL (ref 1.8–7.7)
WBC: 6.9 10*3/uL (ref 3.5–11.0)

## 2012-03-01 LAB — BASIC METABOLIC PANEL
Anion Gap: 7 mmol/L — ABNORMAL LOW (ref 8–16)
BUN: 21 mg/dL — ABNORMAL HIGH (ref 6–20)
CO2: 29 mmol/L (ref 20–31)
Calcium: 9.4 mg/dL (ref 8.6–10.4)
Chloride: 108 mmol/L (ref 98–110)
Creatinine: 0.83 mg/dL (ref 0.4–1.0)
GFR African American: >60 mL/min (ref >60)
GFR Non-African American: >60 mL/min (ref >60)
Glucose: 100 mg/dL (ref 74–106)
Potassium: 4.8 mmol/L (ref 3.5–5.1)
Sodium: 139 mmol/L (ref 136–145)

## 2012-03-01 MED ADMIN — citalopram (CELEXA) tablet 40 mg: ORAL | @ 12:00:00 | NDC 13668001005

## 2012-03-01 MED ADMIN — simvastatin (ZOCOR) tablet 20 mg: ORAL | @ 01:00:00 | NDC 68180047903

## 2012-03-01 MED ADMIN — sodium chloride 0.9 % injection 10 mL: INTRAVENOUS | @ 12:00:00

## 2012-03-01 MED ADMIN — levothyroxine (SYNTHROID) tablet 50 mcg: ORAL | @ 12:00:00 | NDC 00074455290

## 2012-03-01 MED ADMIN — aspirin EC tablet 325 mg: ORAL | @ 12:00:00 | NDC 57896092110

## 2012-03-01 MED ADMIN — enoxaparin (LOVENOX) injection 40 mg: SUBCUTANEOUS | @ 12:00:00 | NDC 00075062040

## 2012-03-01 MED ADMIN — famotidine (PEPCID) injection 20 mg: INTRAVENOUS | @ 12:00:00 | NDC 00641602201

## 2012-03-01 MED ADMIN — sodium chloride 0.9 % injection 10 mL: INTRAVENOUS | @ 01:00:00

## 2012-03-01 MED ADMIN — famotidine (PEPCID) injection 20 mg: INTRAVENOUS | @ 01:00:00 | NDC 00641602201

## 2012-03-01 MED ADMIN — acetaminophen (TYLENOL) tablet 650 mg: 650 mg | ORAL | @ 19:00:00 | NDC 63739044001

## 2012-03-01 MED FILL — FAMOTIDINE 10 MG/ML IV SOLN: 10 MG/ML | INTRAVENOUS | Qty: 2

## 2012-03-01 MED FILL — SIMVASTATIN 20 MG PO TABS: 20 MG | ORAL | Qty: 1

## 2012-03-01 MED FILL — MAPAP 325 MG PO TABS: 325 MG | ORAL | Qty: 2

## 2012-03-01 MED FILL — CITALOPRAM HYDROBROMIDE 20 MG PO TABS: 20 MG | ORAL | Qty: 2

## 2012-03-01 MED FILL — LEVOTHROID 50 MCG PO TABS: 50 MCG | ORAL | Qty: 1

## 2012-03-01 MED FILL — LOVENOX 40 MG/0.4ML SC SOLN: 40 MG/0.4ML | SUBCUTANEOUS | Qty: 0.4

## 2012-03-01 MED FILL — ASPIRIN EC 325 MG PO TBEC: 325 MG | ORAL | Qty: 1

## 2012-03-01 NOTE — Progress Notes (Signed)
Occupational Therapy  [facility]  Occupational Therapy  Evaluation  Date: 03/01/2012  Patient Name: Ashley Burgess        MRN: [id number]    DOB: 08/28/1955  (55 y.o.)  Gender: female      Diagnosis: Patient admitted with chest pressure, weakness and dysarthria x several weeks  Additional Pertinent Hx: Patient admitted with chest pressure, weakness and dysarthria x several weeks    Restrictions/Precautions: Fall Risk  Other position/activity restrictions: Telemetry        Subjective  Pain Level: 0  Pain Location: Back     Vision  Vision: Within Functional Limits  Hearing  Hearing: Within functional limits  Home Living  Lives With: Spouse  Type of Home: House  Home Layout: Two level;Bed/Bath upstairs  Home Access:  (4 STE with 2 HRs)  Bathroom Shower/Tub: Cabin crew: Grab bars in Medical laboratory scientific officer Accessibility: Accessible  Home Equipment: Medical laboratory scientific officer  Prior Function  ADL Assistance: Independent  Homemaking Assistance: Independent  Ambulation Assistance: Independent  Transfer Assistance: Independent    Objective          Sensation  Overall Sensation Status: WNL          Gross LUE Strength:  (5/5)  Gross RUE Strength:  (5/5)  ADL  Feeding: Independent  Grooming: Setup  UE Bathing: Setup  LE Bathing: Contact guard assistance  UE Dressing: Setup  LE Dressing: Contact guard assistance  Toileting: Contact guard assistance  Transfer: Contact guard assistance   Rolling: Stand by assistance  Supine to Sit: Stand by assistance  Sit to Supine: Stand by assistance       Balance  Sitting Balance: Independent  Standing Balance: Contact guard assistance (Fair static and dynamic)   Standing Balance  Sit to stand: Contact guard assistance  Stand to sit: Contact guard assistance                   Goals  Short term goals  Time Frame for Short term goals: By discharge, patient will...  Short term goal 1: complete grooming and hygiene while standing at sink with MI  Short term goal 2: complete  bathing with use of DME and AD while seated with MI  Short term goal 3: complete dressing while seated with MI  Short term goal 4: complete functional sit to stand transfers with MI and RW   Short term goal 5: complete toileting with MI with RW and DME    Plan  Requires OT Follow Up: Yes    Times per week: 5  Times per day: Daily    Time In: 0845  Time Out: 0930  Timed Code Treatment Minutes: 45 Minutes  Total Treatment Time: [address]          Leveda Anna, OTR/L  03/01/2012

## 2012-03-01 NOTE — Plan of Care (Signed)
Problem: Falls - Risk of  Goal: Absence of falls  Outcome: Ongoing  Patient remains free from falls at present time. Will continue to monitor

## 2012-03-01 NOTE — Progress Notes (Signed)
Visited with pt as follow up. 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 States that "they are still trying to figure out what happened. Ruled out stroke, doing Echo today." Provided support and presence.

## 2012-03-01 NOTE — Progress Notes (Signed)
IM RESIDENT PROGRESS NOTE        Patient:  Ashley Burgess  Date of Birth: [birth year]    MRN: [id number]     Acct: [id number]     Admit date: 02/28/2012    Pt seen and Chart reviewed.    Subjective: awaiting EEG and 2D Echo reports.  SLP worked with patient, pt feels slow mentation and processsing, will continue to work with pt.  Currently pt is AOx3 and feels at baseline for mentation and processing.  Speech is improved since yesterday.  Planned for Echo today. Denies any SOB or CP with movement, has had normal BM and good urine output. Denies any fever/chills/nausea/vomiting/dizziness/light headedness     Diet:  General      Medications:Current Inpatient    Scheduled Meds:     ??? aspirin  325 mg Oral Daily   ??? levothyroxine  50 mcg Oral Daily   ??? citalopram  40 mg Oral Daily   ??? sodium chloride  10 mL Intravenous Q12H SCH   ??? enoxaparin (LOVENOX) injection  40 mg Subcutaneous Daily   ??? famotidine  20 mg Intravenous BID   ??? simvastatin  20 mg Oral Nightly     Continuous Infusions:     ??? sodium chloride 75 mL/hr at 02/28/12 2036     PRN Meds:sodium chloride, acetaminophen, magnesium hydroxide, ondansetron    Objective:    Physical Exam:  Vitals: BP 117/64   Pulse 46   Temp(Src) 97.7 ??F (36.5 ??C) (Oral)   Resp 16   Ht 5\' 6"  (1.676 m)   Wt 198 lb 6.6 oz (90 kg)   BMI 32.04 kg/m2   SpO2 98%  24 hour intake/output:  Intake/Output Summary (Last 24 hours) at 03/01/12 0758  Last data filed at 03/01/12 0500   Gross per 24 hour   Intake    560 ml   Output      0 ml   Net    560 ml     Last 3 weights:  Wt Readings from Last 3 Encounters:   02/28/12 198 lb 6.6 oz (90 kg)       Physical Examination:   General appearance - alert, well appearing, and in no distress   Mental status - alert, oriented to person, place, and time   Eyes - pupils equal and reactive, extraocular eye movements intact   Nose - normal and patent, no erythema, discharge or polyps   Mouth -  mucous membranes moist, pharynx normal without lesions   Neck - supple, no significant adenopathy   Chest - clear to auscultation, no wheezes, rales or rhonchi, symmetric air entry   Heart - normal rate, regular rhythm, normal S1, S2, no murmurs, rubs, clicks or gallops   Abdomen - soft, nontender, nondistended, no masses or organomegaly   Neurological - alert, oriented, normal speech. Cranial nerves II-XII grossly intact. Upper and lower extremity strength 5/5 bilaterally on exam (patient notes slight weakness on R compared to L). Rapid alternating hand movements WNL.   Extremities - peripheral pulses normal, no pedal edema, no clubbing or cyanosis   Skin - normal coloration and turgor, no rashes, no suspicious skin lesions noted     Labs:-    CBC:   Recent Labs      02/29/12   0601  02/29/12   1013  03/01/12   0613   WBC  7.1  5.2  6.9   HGB  12.4  12.6  12.9   PLT  Platelet clumps present, count appears adequate.  192  228     BMP:  Recent Labs      02/28/12   1618  02/29/12   1013  03/01/12   0613   NA  142  139  139   K  4.2  4.0  4.8   CL  109  109  108   CO2  26  24  29    BUN  22*  18  21*   CREATININE  0.84  0.75  0.83   GLUCOSE  90  89  100     Calcium:  Recent Labs      03/01/12   0613   CALCIUM  9.4     Magnesium:  Recent Labs      02/28/12   1933   MG  2.2     BNP:  Recent Labs      02/28/12   1933   BNP  26     HgbA1C:   Recent Labs      02/29/12   1013   LABA1C  5.5     CARDIAC ENZYMES:  Recent Labs      02/28/12   1933  02/29/12   0127  02/29/12   0601   CKTOTAL  118  100  93   CKMB  1.8  1.8  1.3   TROPONINI  <0.01  <0.01  <0.01     BNP:   Recent Labs      02/28/12   1933   BNP  26     Lipids:   Recent Labs      02/29/12   0601   CHOL  184   TRIG  112   HDL  65     ABGs:   No results found for this basename: PH, PCO2, PO2, HCO3, O2SAT     Thyroid:   Lab Results   Component Value Date    TSH 1.92 02/28/2012      Urinalysis: Color, UA   Date Value Range Status   02/28/2012 YELLOW  YEL Final        pH, UA    Date Value Range Status   02/28/2012 5.5  5.0 - 8.0 Final        Specific Gravity, UA   Date Value Range Status   02/28/2012 1.083* 1.005 - 1.030 Final        Protein, UA   Date Value Range Status   02/28/2012 NEGATIVE  NEG Final        RBC, UA   Date Value Range Status   02/28/2012 None  0 - 2 /HPF Final        Bacteria, UA   Date Value Range Status   02/28/2012 NOT REPORTED  NONE Final        Nitrite, Urine   Date Value Range Status   02/28/2012 NEGATIVE  NEG Final        WBC, UA   Date Value Range Status   02/28/2012 0 TO 2  0 - 5 /HPF Final        Leukocyte Esterase, Urine   Date Value Range Status   02/28/2012 NEGATIVE  NEG Final        Yeast, UA   Date Value Range Status   02/28/2012 NOT REPORTED  NONE Final        Glucose, Ur   Date Value Range Status   02/28/2012 NEGATIVE  NEG Final  Bilirubin Urine   Date Value Range Status   02/28/2012 NEGATIVE  NEG Final     Films   CTA WO contrast 02/28/12: pending final read     MRI brain w Wo contrast 02/29/12: Oval-shaped CSF intensity structure noted with peripheral hyperintense rim in the frontal horn of the right lateral ventricle. Differential considerations include neuroepithelial cyst, epididymal   cyst, arachnoid or colloid cyst.. No associated enhancement is seen. Please consider 6 month followup assessment  2. No acute or subacute ischemic insult noted    CT Brain perfusion 02/28/12: No definite evidence for acute ischemic infarct noted. No definite evidence for a salvageable penumbra seen. No evidence for   intracranial hemorrhage seen.    CTA head/neck w, WO contrast 02/28/12: Unremarkable examination. No stenosis, dissection, thrombus or aneurysm involving the carotid and intracranial circulation    CXR 02/28/12: The heart is not enlarged. Mediastinum appears within normal limits. There is no significant vascular congestion, focal lung consolidation,   pleural effusion, or pneumothorax. Bones and soft tissues about the chest appear grossly  unremarkable.      Assessment:   1. Generalized weakness: improved  2. Speech disturbance: improving   3. R/o TIA?  4. Chest Pain: r/o ACS? Likely musculoskeletal/costochondritis   5. Hx Depression  6. Hx Hyperlipidemia  7. Hx ADHD: well controlled on adderall   8.     Plan:    1. Echo ordered   2. Cardiology on board: could be ischemic component and musculoskeletal component to chest pain/SOB  3. Keep SBP between 140-180  4. Neuro checks  5. EEG  6. 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 CTA head/neck WNL  8. Neuro following  9. IVF NS @ 75  10. PT/OT   11. GI/DVT Prophylaxis   12. Regular diet, s/p swallow eval  13. D/c planning if cardio/neuro not planning any further intervention  14. Stress test as outpatient                  Bryna Colander, DO           03/01/2012, 7:58 AM      Attending Physician Statement  I have discussed the care of Ashley Burgess, including pertinent history and exam findings,  with the resident. I have seen and examined the patient and the key elements of all parts of the encounter have been performed by me.  I agree with the assessment, plan and orders as documented by the resident with additions .  D/w patients can be discharged home after echo and eeg and if ok with consult services but will need out pt stress test exersize myoview .   Benetta Spar, MD   03/01/2012   2:41 PM

## 2012-03-02 ENCOUNTER — Inpatient Hospital Stay
Admit: 2012-03-02 | Discharge: 2012-03-02 | Disposition: A | Discharge status: Home / Self Care | Attending: Emergency Medicine

## 2012-03-02 LAB — CBC WITH AUTO DIFFERENTIAL
Absolute Eos #: 0.3 10*3/uL (ref 0.0–0.4)
Absolute Lymph #: 2.1 10*3/uL (ref 1.0–4.8)
Absolute Mono #: 0.5 10*3/uL (ref 0.1–1.2)
Basophils Absolute: 0.1 10*3/uL (ref 0.0–0.2)
Basophils: 1 % (ref 0–2)
Eosinophils %: 3 % (ref 1–4)
Hematocrit: 42.2 % (ref 36–46)
Hemoglobin: 14.3 g/dL (ref 12.0–16.0)
Lymphocytes: 25 % (ref 24–44)
MCH: 29 pg (ref 26–34)
MCHC: 33.8 g/dL (ref 31–37)
MCV: 85.9 fL (ref 80–100)
MPV: 9.6 fL (ref 6.0–12.0)
Monocytes: 6 % (ref 2–11)
Platelets: 285 10*3/uL (ref 140–450)
RBC: 4.91 m/uL (ref 4.0–5.2)
RDW: 14.2 % (ref 12.5–15.4)
Seg Neutrophils: 65 % (ref 36–66)
Segs Absolute: 5.2 10*3/uL (ref 1.8–7.7)
WBC: 8.2 10*3/uL (ref 3.5–11.0)

## 2012-03-02 LAB — POCT TROPONIN
POC Troponin I: 0 ng/mL (ref 0.00–0.10)
POC Troponin I: 0 ng/mL (ref 0.00–0.10)

## 2012-03-02 LAB — BASIC METABOLIC PANEL
Anion Gap: 4 mmol/L — ABNORMAL LOW (ref 8–16)
BUN: 18 mg/dL (ref 6–20)
CO2: 26 mmol/L (ref 20–31)
Calcium: 9.7 mg/dL (ref 8.6–10.4)
Chloride: 112 mmol/L — ABNORMAL HIGH (ref 98–110)
Creatinine: 0.78 mg/dL (ref 0.4–1.0)
GFR African American: >60 mL/min (ref >60)
GFR Non-African American: >60 mL/min (ref >60)
Glucose: 93 mg/dL (ref 74–106)
Potassium: 4.2 mmol/L (ref 3.5–5.1)
Sodium: 138 mmol/L (ref 136–145)

## 2012-03-02 LAB — PROTIME-INR
INR: 0.9
Protime: 10.1 s (ref 9.4–12.6)

## 2012-03-02 LAB — APTT: PTT: 25.2 s (ref 21.3–31.3)

## 2012-03-02 MED ADMIN — aspirin chewable tablet 324 mg: ORAL | @ 17:00:00 | NDC 63739043401

## 2012-03-02 MED FILL — ASPIRIN CHILDRENS 81 MG PO CHEW: 81 MG | ORAL | Qty: 4

## 2012-03-02 NOTE — ED Notes (Signed)
Writer to bedside, labs drawn for 2nd set enzymes. Pt resting on cot, resp even and non-labored. Pt remains on monitoring, vitals stable, pt updated on POC and LOS, await results for dispo, will continue to monitor.     Leeanne Mannan, RN  03/02/12 [phone]

## 2012-03-02 NOTE — ED Provider Notes (Signed)
I performed a history and physical examination of the patient and discussed management with the resident. I reviewed the resident???s note and agree with the documented findings and plan of care. Any areas of disagreement are noted on the chart. I was personally present for the key portions of any procedures. I have documented in the chart those procedures where I was not present during the key portions. I have reviewed the emergency nurses triage note. I agree with the chief complaint, past medical history, past surgical history, allergies, medications, social and family history as documented unless otherwise noted below. Documentation of the HPI, Physical Exam and Medical Decision Making performed by medical students or scribes is based on my personal performance of the HPI, PE and MDM. For Phys Assistant/ Nurse Practitioner cases/documentation I have personally evaluated this patient and have completed at least one if not all key elements of the E/M (history, physical exam, and MDM). Additional findings are as noted.    Patient's 56 year old Caucasian woman who is discharged from the hospital last night after being worked up for similar symptoms.  She had slurred speech and right-sided weakness with some chest pain.  She had a CT scan, CT perfusion study, CTA of the neck, MRI of the brain, cardiology and neurology consultation, serial enzymes, and an echocardiogram.  All were negative.  The patient was at home today and states she had return of her symptoms to include slurred speech and brief chest pain that has now resolved.    On examination she has a normal neurologic examination.  She has an NIH stroke scale of 0.  Patient has normal sensation and motor function.  She is alert and oriented and speaking with articulate speech.    We will get cardiac enzymes today.  We will not obtain another CAT scan as her exam is normal and her previous diagnosis of TIA will be unchanged by another CAT scan.    Ronnie Derby, DO  Attending Emergency  Physician            Bridgette Habermann, DO  03/02/12 [phone]

## 2012-03-02 NOTE — ED Notes (Signed)
Agree with triage note, pt arrives via wheelchair from triage with family member. Pt reports chest pain, sudden onset of weakness and slurred speech around 1030 today with nausea and headache. Pt reports chest pain has now resolved. Stroke scale negative, pt with strong and equal extremities.     Adonis Housekeeper, RN  03/02/12 [phone]

## 2012-03-02 NOTE — ED Notes (Signed)
Met with patient per patient's request. Patient wanted someone to talk to about martial conflicts. Social worker listened and offered perspective on her situation. Patient also may need a cab voucher for transportation. Social worker will check back with patient at time of discharge.

## 2012-03-02 NOTE — Progress Notes (Signed)
Pt called out to chaplain as chaplain walked by. Pt was known to chaplain from her ED admission the previous week. Pt anxious due to not feeling well, and also due to discord with her spouse. Provided support and presence.

## 2012-03-02 NOTE — ED Provider Notes (Signed)
[facility]  eMERGENCY dEPARTMENT eNCOUnter  Resident    Pt Name: Ashley Burgess  MRN: [id number]  [place] 08/30/1955  Date of evaluation: 03/02/2012    CHIEF COMPLAINT       Chief Complaint   Patient presents with   ??? Fatigue     c/o slurred speach with c/p and weakness, not being able to ambulate, coughing h/a nausea, states was released from here recently for tia.         HISTORY OF PRESENT ILLNESS    Ashley Burgess is a 56 y.o. female who presents With multiple complaints. She was just discharged from the hospital last night after being worked up for a TIA.  She apparently had the same exact symptoms when she came in.  She states around 10:30 today while at rest, sitting and watching television, she developed some slurred speech and generalized weakness.  She also states that she had some chest pain in the left side of her chest and lasted for a brief moment.  The pain was non-radiating.  She also has a headache with nausea and no vomiting.  She had a CT scan/CT perfusion/CTA head/neck, MRI brain, echocardiogram, carotid doppler- all of which were negative.     Location/Symptom: general weakness, slurred speech, headache, chest pain  Timing/Onset: suddenly  Context/Setting: was at rest watching TV  Quality: sharp   Duration: since 1030am  Modifying Factors: none  Severity: mild    REVIEW OF SYSTEMS         Review of Systems   Constitutional: Negative for fever and chills.   HENT: Negative for neck pain and neck stiffness.    Eyes: Negative for photophobia and visual disturbance.   Respiratory: Negative for cough and shortness of breath.    Cardiovascular: Positive for chest pain. Negative for leg swelling.   Gastrointestinal: Positive for nausea. Negative for vomiting and abdominal pain.   Genitourinary: Negative for dysuria and frequency.   Musculoskeletal: Negative for back pain and joint swelling.   Skin: Negative for rash and wound.   Neurological: Positive for weakness,  light-headedness and headaches. Negative for numbness.   Hematological: Negative for adenopathy. Does not bruise/bleed easily.   Psychiatric/Behavioral: Negative for confusion. The patient is not nervous/anxious.            PAST MEDICAL HISTORY    has a past medical history of Hyperlipidemia; Depression; and Thyroid disease.      SURGICAL HISTORY      has past surgical history that includes Nasal septum surgery (Bilateral).      CURRENT MEDICATIONS       Previous Medications    ASPIRIN 81 MG TABLET    Take 81 mg by mouth daily.    ESCITALOPRAM (LEXAPRO) 20 MG TABLET    Take 20 mg by mouth daily.    LEVOTHYROXINE (SYNTHROID) 88 MCG TABLET    Take 88 mcg by mouth Daily.    LISINOPRIL (PRINIVIL;ZESTRIL) 20 MG TABLET    Take 20 mg by mouth daily.    METHYLPHENIDATE (CONCERTA) 36 MG CR TABLET    Take 36 mg by mouth every morning.    SIMVASTATIN (ZOCOR) 20 MG TABLET    Take 20 mg by mouth nightly.       ALLERGIES      has no known allergies.    FAMILY HISTORY     has no family status information on file.    family history includes Stroke in her father and  paternal aunt.      SOCIAL HISTORY      reports that she drinks about 0.6 ounces of alcohol per week. She reports that she does not use illicit drugs.    PHYSICAL EXAM     INITIAL VITALS:  oral temperature is 98.8 ??F (37.1 ??C). Her blood pressure is 135/94 and her pulse is 61. Her respiration is 18 and oxygen saturation is 99%.        Physical Exam   Nursing note and vitals reviewed.  Constitutional: She is oriented to person, place, and time. She appears well-developed and well-nourished. She is active.   HENT:   Head: Normocephalic and atraumatic.   Nose: Nose normal.   Mouth/Throat: Oropharynx is clear and moist and mucous membranes are normal.   Eyes: Conjunctivae and EOM are normal. Pupils are equal, round, and reactive to light.   Neck: Trachea normal and normal range of motion. Neck supple. No JVD present.   Cardiovascular: Normal rate, regular rhythm, normal  heart sounds, intact distal pulses and normal pulses.    Pulmonary/Chest: Effort normal and breath sounds normal. No respiratory distress. She has no wheezes. She has no rales.   Abdominal: Soft. Bowel sounds are normal. She exhibits no distension. There is no tenderness. There is no rebound and no guarding.   Musculoskeletal: Normal range of motion.   Neurological: She is alert and oriented to person, place, and time. She has normal strength. No cranial nerve deficit or sensory deficit. Coordination normal. GCS eye subscore is 4. GCS verbal subscore is 5. GCS motor subscore is 6. She displays no Babinski's sign on the right side. She displays no Babinski's sign on the left side.   NIH scale of 0.  Absolutely no neurodeficits noted.  Normal finger-nose-finger, normal rapid alternating movements, normal heel to shin   Skin: Skin is warm, dry and intact.   Psychiatric: She has a normal mood and affect. Her speech is normal and behavior is normal. Judgment and thought content normal. Cognition and memory are normal.         DIFFERENTIAL DIAGNOSIS/MDM:   Given her recent negative workup and lack of findings here today and lack of findings today.  We will discharge her home as she is already going to be having a stress test this week.  We will await second set of enzymes and d/c home.      DIAGNOSTIC RESULTS     EKG: All EKG's are interpreted by the Emergency Department Physician who either signs or Co-signs this chart in the absence of a cardiologist.    EKG Interpretation    Interpreted by me    Rhythm: normal sinus   Rate: normal  Axis: normal  Ectopy: none  Conduction: normal  ST Segments: no acute change  T Waves: no acute change  Q Waves: none    Clinical Impression: no acute changes and normal EKG    RADIOLOGY:   I directly visualized the following  images and reviewed the radiologist interpretations:  XR CHEST STANDARD TWO VW    Final Result:        normal mediastinum, no ptx      LABS:  Labs Reviewed   BASIC  METABOLIC PANEL - Abnormal; Notable for the following:     Chloride 112 (*)     Anion Gap 4 (*)     All other components within normal limits   CBC WITH AUTO DIFFERENTIAL   PROTIME-INR   APTT   POCT  TROPONIN   POCT TROPONIN         EMERGENCY DEPARTMENT COURSE:   Vitals:    Filed Vitals:    03/02/12 1249   BP: 135/94   Pulse: 61   Temp: 98.8 ??F (37.1 ??C)   TempSrc: Oral   Resp: 18   SpO2: 99%       CONSULTS:  None        FINAL IMPRESSION      1. Chest pain    2. Weakness            DISPOSITION/PLAN   DISPOSITION     Discharge to home in improved condition.    PATIENT REFERRED TO:  Dellis Filbert, MD  [place]  [place] [address]  [phone]    Schedule an appointment as soon as possible for a visit  Also please follow up with your scheduled stress test this week.      DISCHARGE MEDICATIONS:  None      (Please note that portions of this note were completed with a voice recognition program.  Efforts were made to edit the dictations but occasionally words are mis-transcribed.)    Cecia Egge C. Wynona Meals  Emergency Medicine Resident              Domenic Schwab, DO  Resident  03/02/12 [phone]

## 2012-03-02 NOTE — ED Notes (Signed)
Writer to bedside, pt medicated as ordered, family member at bedside, awake, alert, oriented, resp even and non-labored, remains on monitoring, duration unknown at this time, will continue to monitor.      Adonis Housekeeper, RN  03/02/12 [phone]

## 2012-03-02 NOTE — ED Provider Notes (Signed)
[facility]  Emergency Department  Faculty Sign-Out Addendum          Care of NYELLI SKELLENGER was assumed from Bridgette Habermann, DO and is being seen for Fatigue  .  The patient's initial evaluation and plan have been discussed with the prior provider who initially evaluated the patient.      ED COURSE      The patient was given the following medications while in the emergency department:      Orders Placed This Encounter   Medications   ??? aspirin chewable tablet 324 mg     Sig:        LABS:       Results for orders placed during the hospital encounter of 03/02/12   CBC WITH AUTO DIFFERENTIAL       Result Value Range    WBC 8.2  3.5 - 11.0 k/uL    RBC 4.91  4.0 - 5.2 m/uL    Hemoglobin 14.3  12.0 - 16.0 g/dL    Hematocrit 42.2  36 - 46 %    MCV 85.9  80 - 100 fL    MCH 29.0  26 - 34 pg    MCHC 33.8  31 - 37 g/dL    RDW 14.2  12.5 - 15.4 %    Platelets 285  140 - 450 k/uL    MPV 9.6  6.0 - 12.0 fL    Differential Type NOT REPORTED      Seg Neutrophils 65  36 - 66 %    Lymphocytes 25  24 - 44 %    Monocytes 6  2 - 11 %    Eosinophils 3  1 - 4 %    Basophils 1  0 - 2 %    Segs Absolute 5.20  1.8 - 7.7 k/uL    Absolute Lymph # 2.10  1.0 - 4.8 k/uL    Absolute Mono # 0.50  0.1 - 1.2 k/uL    Absolute Eos # 0.30  0.0 - 0.4 k/uL    Basophils Absolute 0.10  0.0 - 0.2 k/uL    WBC Morphology NOT REPORTED      RBC Morphology NOT REPORTED      Platelet Estimate NOT REPORTED     BASIC METABOLIC PANEL       Result Value Range    Glucose 93  74 - 106 mg/dL    BUN 18  6 - 20 mg/dL    Creatinine, Ser 0.78  0.4 - 1.0 mg/dL    Bun/Cre Ratio NOT REPORTED  9 - 20    Calcium 9.7  8.6 - 10.4 mg/dL    Sodium 138  136 - 145 mmol/L    Potassium 4.2  3.5 - 5.1 mmol/L    Chloride 112 (*) 98 - 110 mmol/L    CO2 26  20 - 31 mmol/L    Anion Gap 4 (*) 8 - 16 mmol/L    GFR Non-African American >60  >60 mL/min    GFR African American >60  >60 mL/min    GFR Comment           GFR Staging NOT REPORTED     PROTIME-INR        Result Value Range    Protime 10.1  9.4 - 12.6 sec    INR 0.9     APTT       Result Value Range    PTT 25.2  21.3 -  31.3 sec       RECENT VITALS:     Temp: 98.8 ??F (37.1 ??C),  Pulse: 61 , Resp: 18 , BP: 135/94 mmHg, SpO2: 99 %    MEDICAL DECISION MAKING       This patient is a 56 y.o. Female.   D/c'd last night w/ c/o chest pain and stroke symptoms.  Came in for similar complaints    We are awaiting labs and then d/c w/ f/u pcp.    Thelma Comp, MD, [facility]  Emergency Medicine Attending      Thelma Comp, MD  03/02/12 [phone]

## 2012-03-02 NOTE — ED Notes (Signed)
Malachy Mood, LSW at bedside speaking with pt. Pt remains on monitoring, NAD noted, resp even and non-labored, will continue to monitor.      Adonis Housekeeper, RN  03/02/12 [phone]

## 2012-03-02 NOTE — ED Notes (Signed)
Writer to bedside, pt updated on POC and LOS, await 2nd set due @ 1600. Pt verbalizing understanding, NAD noted, remains on monitoring, will continue to monitor.      Leeanne Mannan, RN  03/02/12 [phone]

## 2012-03-02 NOTE — Discharge Instructions (Signed)
Weakness, Generalized Without Cause  Your caregiver has seen you today because you are having problems with feelings of weakness. Weakness has many different causes, some of which are common and others are very rare. The causes of weakness are so numerous they could not all be listed on this page.  The exam and other tests done today do not reveal a specific cause for the weakness that is an immediate danger or something that is treatable. Your caregiver has checked you for the most common causes of weakness and feels it is safe for you to go home and be observed.  HOME CARE INSTRUCTIONS     For the time being, obtain more rest if needed.   Eat a well balanced diet.   Try to get at least some exercise every day in spite of how difficult it may seem at times. In the case of the elderly, exercise is especially important. As we grow older, there is a loss of muscle mass. Generally, there is also a loss of, or decrease in, activity that comes naturally with the aging process. Exercise and increased activities are the only tools we have to combat this natural process.   The results of some tests ordered today may not be available right away. You will be contacted with those results when they become available.   It is important to follow through with your physician as per instructions that you may have received today.  SEEK MEDICAL CARE IF:     You have any new concerns which you do not feel were dealt with today.   The weakness seems to be getting progressively worse.   You develop new or unusual aches or pains.  SEEK IMMEDIATE MEDICAL CARE IF:     You are unable to tend to your usual daily activities such as simply getting dressed, feeding yourself, or keeping up with your personal hygiene.   You develop inability to walk stairs or perform your usual daily activities.   You develop shortness of breath, chest pain, have difficulty moving parts of your body, or develop new problems for which you have  not talked to your caregiver.   You experience difficulty speaking or swallowing.   You develop loss of control of bladder or bowels that was not present before.  Document Released: 06/25/2005 Document Revised: 09/17/2011 Document Reviewed: 08/24/2011  [facility] Patient Information 2013 [place].        Chest Pain (Nonspecific)  It is often hard to give a specific diagnosis for the cause of chest pain. There is always a chance that your pain could be related to something serious, such as a heart attack or a blood clot in the lungs. You need to follow up with your caregiver for further evaluation.  CAUSES     Heartburn.   Pneumonia or bronchitis.   Anxiety or stress.   Inflammation around your heart (pericarditis) or lung (pleuritis or pleurisy).   A blood clot in the lung.   A collapsed lung (pneumothorax). It can develop suddenly on its own (spontaneous pneumothorax) or from injury (trauma) to the chest.   Shingles infection (herpes zoster virus).  The chest wall is composed of bones, muscles, and cartilage. Any of these can be the source of the pain.   The bones can be bruised by injury.   The muscles or cartilage can be strained by coughing or overwork.   The cartilage can be affected by inflammation and become sore (costochondritis).  DIAGNOSIS  Lab tests or other studies, such as X-rays, electrocardiography, stress testing, or cardiac imaging, may be needed to find the cause of your pain.    TREATMENT     Treatment depends on what may be causing your chest pain. Treatment may include:   Acid blockers for heartburn.   Anti-inflammatory medicine.   Pain medicine for inflammatory conditions.   Antibiotics if an infection is present.   You may be advised to change lifestyle habits. This includes stopping smoking and avoiding alcohol, caffeine, and chocolate.   You may be advised to keep your head raised (elevated) when sleeping. This reduces the chance of acid going backward from your  stomach into your esophagus.   Most of the time, nonspecific chest pain will improve within 2 to 3 days with rest and mild pain medicine.  HOME CARE INSTRUCTIONS     If antibiotics were prescribed, take your antibiotics as directed. Finish them even if you start to feel better.   For the next few days, avoid physical activities that bring on chest pain. Continue physical activities as directed.   Do not smoke.   Avoid drinking alcohol.   Only take over-the-counter or prescription medicine for pain, discomfort, or fever as directed by your caregiver.   Follow your caregiver's suggestions for further testing if your chest pain does not go away.   Keep any follow-up appointments you made. If you do not go to an appointment, you could develop lasting (chronic) problems with pain. If there is any problem keeping an appointment, you must call to reschedule.  SEEK MEDICAL CARE IF:     You think you are having problems from the medicine you are taking. Read your medicine instructions carefully.   Your chest pain does not go away, even after treatment.   You develop a rash with blisters on your chest.  SEEK IMMEDIATE MEDICAL CARE IF:     You have increased chest pain or pain that spreads to your arm, neck, jaw, back, or abdomen.   You develop shortness of breath, an increasing cough, or you are coughing up blood.   You have severe back or abdominal pain, feel nauseous, or vomit.   You develop severe weakness, fainting, or chills.   You have a fever.  THIS IS AN EMERGENCY. Do not wait to see if the pain will go away. Get medical help at once. Call your local emergency services (911 in [place] Do not drive yourself to the hospital.  MAKE SURE YOU:     Understand these instructions.   Will watch your condition.   Will get help right away if you are not doing well or get worse.  Document Released: 04/04/2005 Document Revised: 09/17/2011 Document Reviewed: 01/29/2008  [facility] Patient Information 2013  [place].        Please read and follow instructions provided.  Take medications as directed.  Contact your doctor tomorrow for follow up within 2-3 days.  If your symptoms progress or worsen, you develop focal weakness, worsening pain, or you have any new symptoms that concern you please return to Emergency Department immediately.

## 2012-03-03 LAB — POCT CREATININE: POC Creatinine: 0.9 mg/dL (ref 0.6–1.4)

## 2012-03-03 NOTE — Procedures (Signed)
[facility]. [facility]                 [address] [place], [place]  [address]                                    EEG REPORT    Patient:         Ashley Burgess, Ashley Burgess         Reg#:  [id number]   MRN      [id number]  Birth Date:      May 02, 1956  Patient Status:  ID                         Clinic Code:      [facility]  Room:            C5  595638                 Adm:              02/28/2012  Sex:             F                          Dis:  Atd Phys:        Luan Moore, M.D.  Dct By:          Arrie Aran, M.D.     Date of           02/29/2012                                              Procedure:  PCP Phys:        Luiz Blare, M.D.   EEG No:  Ordering Phys:                              Jeremy Johann. Name:  Document#:       [id number]                    Job#:             [id number]  Date/Time Typed: 03/03/2012 01:27 P         By:               Tamela Gammon  Date/Time Dct:   03/01/2012    05:05 P      PC                C                                              Facility:         T0    INDICATIONS:  A 55 year old patient with weakness, confusion, slurring of speech.    MEDICATIONS:  Not listed.    TECHNIQUE:  This is a 16-channel EEG with 1 EKG channel recording performed on a  patient described to be awake, drowsy.  The patient shows normal waking  rhythms.  Background activity consists of well-regulated 11 Hz activity  in  the 40-60 mV range more prominent at the posterior head area showing good  reactivity to eye opening and closing.  Over the anterior head regions  there are 15-20 Hz activity in 20-30 mV range.  With drowsiness, there is  further intrusion of slower frequencies in the theta and lesser degree a  delta band.  This record is not lateralized or epileptiform.  Hyperventilation is not performed.  Photic stimulation shows no change of  the record.    IMPRESSION:  This electroencephalogram is within normal limits for an awake and drowsy  patient.  No lateralized or epileptiform  disturbance is seen.  EEG code  [phone].        "In order to promptly notify physicians concerning their patients, this  unconfirmed document is being released.  It is not considered final until  reviewed and signed."       ____________________________  Arrie Aran, M.D.      cc:   Arrie Aran, M.D.        Luiz Blare, M.D.        Luan Moore, M.D.

## 2012-03-04 LAB — EKG 12-LEAD
Atrial Rate: 60 {beats}/min
Atrial Rate: 61 {beats}/min
P Axis: 60 degrees
P Axis: 89 degrees
P-R Interval: 158 ms
P-R Interval: 158 ms
Q-T Interval: 440 ms
Q-T Interval: 446 ms
QRS Duration: 74 ms
QRS Duration: 78 ms
QTc Calculation (Bazett): 440 ms
QTc Calculation (Bazett): 449 ms
R Axis: -175 degrees
R Axis: 3 degrees
T Axis: 147 degrees
T Axis: 47 degrees
Ventricular Rate: 60 {beats}/min
Ventricular Rate: 61 {beats}/min

## 2012-03-05 LAB — EKG 12-LEAD
Atrial Rate: 60 {beats}/min
P Axis: 63 degrees
P-R Interval: 170 ms
Q-T Interval: 438 ms
QRS Duration: 78 ms
QTc Calculation (Bazett): 438 ms
R Axis: 0 degrees
T Axis: 41 degrees
Ventricular Rate: 60 {beats}/min

## 2012-03-07 NOTE — Discharge Summary (Signed)
Internal Medicine Discharge Summary         Patient Identification:  Ashley Burgess is a 56 y.o. female.  DOB:  [birth year]  MRN: [id number]     Acct: [id number]   Admit Date:  02/28/2012  Discharge date and time: 03/01/2012  7:10 PM   Attending Provider: Luan Moore, MD                                     Admission Diagnoses:   TIA    Discharge Diagnoses:   There is no problem list on file for this patient.       Consults:   Neurology, cardiology       Brief Inpatient course:   56 y.o. female who presented with weakness and slurred speech, which occurred at around [facility] on 02/28/12. She has had a 3-4 week history of fatigue, shortness of breath, and pressure in her chest prior to admission to the hospital. The pressure presents after exertion (climbing 2 flights of stairs) and resolves upon rest. She has noticed the chest pressure and SOB increase in frequency over the past few weeks.  On 8/22 she experienced sudden bilateral muscle weakness and slurring of speech. She denied asymmetrical dropping of her mouth, and described the slurring as more of a slowing in her speech. Denied any noticeable difference in strength of R vs L side. Patient is left-handed. Denies numbness/tingling. There was no headache or focal motor, sensory, bulbar or visual complaint . Patient does take daily baby aspirin . BP on admission was 131/71.  Head CT normal and CT perfusion normal at the ED.      Pt returned to normal speech baseline within 24 hours.  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  Pt did expereince some brief bradycardic events during the night, HR returned spontaneously to wnl.  Pt was evaluated by PT/OT/speech.     Echo 03/01/12:   Estimated ejection fraction is 55 %.  Mild right ventricular enlargement with normal systolic function.  No significant valvular regurgitation or stenosis seen.  No significant pericardial effusion is seen    EEG 03/01/12: EEG within  normal limits for awake and drowsy patient.  No lateralization or epileptiform disturbance is seen.      MRI brain w Wo contrast 02/29/12: Oval-shaped CSF intensity structure noted with peripheral hyperintense rim in the frontal horn of the right lateral ventricle. Differential considerations include neuroepithelial cyst, epididymal   cyst, arachnoid or colloid cyst.. No associated enhancement is seen. Please consider 6 month followup assessment  2. No acute or subacute ischemic insult noted     CT Brain perfusion 02/28/12: No definite evidence for acute ischemic infarct noted. No definite evidence for a salvageable penumbra seen. No evidence for   intracranial hemorrhage seen.    CTA head/neck w, WO contrast 02/28/12: Unremarkable examination. No stenosis, dissection, thrombus or aneurysm involving the carotid and intracranial circulation    CXR 02/28/12: The heart is not enlarged. Mediastinum appears within normal limits. There is no significant vascular congestion, focal lung consolidation,   pleural effusion, or pneumothorax. Bones and soft tissues about the chest appear grossly unremarkable.    Echo 03/01/12:   Estimated ejection fraction is 55 %.  Mild right ventricular enlargement with normal systolic function.  No significant valvular regurgitation or stenosis seen.  No significant  pericardial effusion is seen    EEG 03/01/12: EEG within normal limits for awake and drowsy patient.  No lateralization or epileptiform disturbance is seen.      Procedures:  EEG 03/01/12, Swallow eval 02/28/12    Any Hospital Acquired Infections: none      Discharge Functional Status:  independent      Disposition:   home      Patient Instructions:   Discharge Meds:-   Discharge Medication List as of 03/01/2012  6:38 PM      CONTINUE these medications which have NOT CHANGED    Details   aspirin 81 MG tablet Take 81 mg by mouth daily.      escitalopram (LEXAPRO) 20 MG tablet Take 20 mg by mouth daily.      lisinopril (PRINIVIL;ZESTRIL) 20  MG tablet Take 20 mg by mouth daily.      simvastatin (ZOCOR) 20 MG tablet Take 20 mg by mouth nightly.      levothyroxine (SYNTHROID) 88 MCG tablet Take 88 mcg by mouth Daily.      methylphenidate (CONCERTA) 36 MG CR tablet Take 36 mg by mouth every morning.           Activity: activity as tolerated  Diet: regular diet    Follow-up with cardiology ([facility]) for an outpatient exercise stress test  Follow-up with your PCP in 1 week  Follow-up with Neurology as directed/needed.       Bryna Colander, DO

## 2012-03-07 NOTE — Discharge Instructions (Addendum)
Please follow up with [facility] [phone] regarding your exercise stress test.     Please follow up with your PCP within 1 week  Please follow up with Neurology as directed.      Discharge Instructions for Transient Ischemic Attack   (Discharge Instructions for TIA)       Transient ischemic attack (TIA) refers to temporary brain dysfunction. It lasts no longer than 24 hours. TIA is due to a shortage of blood and oxygen to the brain. It sometimes is referred to as a mini-stroke. TIA is a serious condition. It serves as a potential warning for a stroke .   Treatment options include:     Medicine     Lifestyle changes     Surgery (in some cases)  Steps to Take   Home Care     The goal is to lower your risk of developing a stroke. Your doctor will work with you to find the reasons for the TIA. For example, if you have blood clotting or heart conditions, these will also be treated.    There are medicines that can stop a stroke once it starts. If you have any new or reoccurring symptoms, call 911 right away.   If you had surgery (eg, carotid endarterectomy ) to remove plaque deposits from the carotid artery:     Keep the incision area clean, dry, and protected.     Follow your doctor's instructions for rest and recovery.  Diet     Eat a heart healthy diet . It should be low in saturated fat. Your doctor may refer you to a dietitian to help you with meal planning.   Physical Activity     Regular exercise will improve your overall health. It will also lower your blood pressure and allow for better control of diabetes (both risk factors for stroke). Discuss an appropriate exercise routine with your doctor.     Ask your doctor when you will be able to return to work.      Do not drive unless your doctor has given you permission to do so.   Medications    To decrease risk of blood clots from forming, your doctor will recommend antiplatelet therapy, such as:     Daily low-dose of aspirin      Dipyridamole or aspirin and dipyridamole     Clopidogrel    Warfarin  Other medicines may include:     ACE inhibitors or diuretics to help lower blood pressure     Statins to help lower cholesterol     Medicine to treat diabetes  If you are taking medicines, follow these general guidelines:     Take your medicine as directed. Do not change the amount or the schedule.      Do not stop taking them without talking to your doctor.      Do not share them.      Know what the results and side effects. Report them to your doctor.      Some drugs can be dangerous when mixed. Talk to a doctor or pharmacist if you are taking more than one drug. This includes over-the-counter medicine and herb or dietary supplements.      Plan ahead for refills so you do not run out.   Lifestyle Changes    You and your doctor will plan lifestyle changes that will help you recover. These changes can also lower your chances of developing a blood clot. Clots are a common reason  for TIA and stroke. Some things to keep in mind include:     Keep your blood pressure under control.     If you have diabetes, keep your blood sugar as normal as possible.     Keep your cholesterol under control. Reduce intake of animal fats and take your medicines.     Quit smoking .     Exercise regularly . It is never too late to be physically fit. Try a daily brisk walk for 30 minutes. Talk to your doctor before starting an exercise routine.     Keep your weight under control. Obesity is a risk factor for stroke.     Avoid alcohol .  Prevention    To help prevent another attack or possibly a stroke:     Make the lifestyle changes.     Take prescribed medicines.  Follow-up    Your doctor will monitor you. Keep all appointments. Have exams done regularly as directed.    Call 911 If Any of the Following Occurs   Warning Signs for Stroke     TIA is a warning sign that a stroke may occur. A stroke is a very serious condition. It is important that you  and those around you know its warning signs.   CALL 911  right away if you have any of the following which may suggest a stroke:     Weakness or numbness on one side of the body, including the face     Seizures    Confusion    Nausea and vomiting of sudden onset     Blurry, dimming, double vision, or no vision     Difficulty swallowing, talking, or comprehending others     Dizziness, falling, or loss of balance     Severe or unusual headache  In case of an emergency,  CALL 911  .     Last Reviewed: September 2010 Dorene Grebe, MD   Updated: 03/28/2009
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
## 2015-06-03 ENCOUNTER — Inpatient Hospital Stay
Admit: 2015-06-03 | Discharge: 2015-06-03 | Disposition: A | Discharge status: Home / Self Care | Attending: Emergency Medicine

## 2015-06-03 DIAGNOSIS — K029 Dental caries, unspecified: Secondary | ICD-10-CM

## 2015-06-03 MED ORDER — ACETAMINOPHEN-CODEINE 300-30 MG PO TABS
300-30 MG | ORAL_TABLET | ORAL | 0 refills | Status: AC | PRN
Start: 2015-06-03 — End: 2022-02-05

## 2015-06-03 MED ORDER — MECLIZINE HCL 25 MG PO TABS
25 MG | ORAL_TABLET | Freq: Three times a day (TID) | ORAL | 0 refills | Status: AC | PRN
Start: 2015-06-03 — End: 2015-06-13

## 2015-06-03 MED ORDER — CLINDAMYCIN HCL 300 MG PO CAPS
300 MG | ORAL_CAPSULE | Freq: Three times a day (TID) | ORAL | 0 refills | Status: AC
Start: 2015-06-03 — End: 2015-06-13

## 2015-06-03 NOTE — Other (Addendum)
Patient Acct Nbr:  [id number]  Primary AUTH/CERT:    Primary Insurance Company Name:   [id number]  Primary Insurance Plan Name:  [employer]  Primary Insurance Group Number:  [id number]  Primary Insurance Plan Type: [employer]  Primary Insurance Policy Number:  [id number]

## 2015-06-03 NOTE — ED Notes (Signed)
Agree with previous RN's assessment.     Carolina CellarJulie A Stachowiak, RN  06/03/15 1322

## 2015-06-03 NOTE — ED Provider Notes (Signed)
[facility]  eMERGENCY dEPARTMENT eNCOUnter      Pt Name: Ashley Burgess  MRN: [id number]  Birthdate [birth year]  Date of evaluation: 06/03/15      CHIEF COMPLAINT:   Chief Complaint   Patient presents with   ??? Dental Pain     Radiating to lt ear pain     HISTORY OF PRESENT ILLNESS    Ashley AllegraSusan M Murrillo is a 59 y.o. female who presents with left ear pain, left sided headache, and left sided jaw pain.  Pt states it began three days ago.  She also states she felt a little off balance while walking.  She started using her cane again because she seems to be veering to the left.  She does admit to poor dentition and previous dental pain.  Denies fever, chills, eye pain, right sided facial pain, CP, SOB, abd pain, nausea, vomiting, diarrhea.  No sick contacts.  Pt has not fallen or loss consciousness.           REVIEW OF SYSTEMS     Review of Systems   Constitutional: Negative for chills and fever.   HENT: Positive for ear pain. Negative for congestion, ear discharge and sore throat.    Eyes: Negative for pain.   Respiratory: Negative for cough, shortness of breath and wheezing.    Cardiovascular: Negative for chest pain.   Gastrointestinal: Negative for abdominal pain, diarrhea, nausea and vomiting.   Neurological: Positive for headaches.       Negative in 10 essential Systems except as mentioned above and in the HPI.      PAST MEDICAL HISTORY   PMH:  has a past medical history of Depression; Hyperlipidemia; Hypothyroidism; Thyroid disease; TIA (transient ischemic attack); and Unspecified sleep apnea. none otherwise stated in nurses notes  Surgical History:  has a past surgical history that includes Nasal septum surgery (Bilateral). none otherwise stated in nurses notes  Social History:  reports that she has never smoked. She has never used smokeless tobacco. She reports that she drinks about 0.6 oz of alcohol per week  She reports that she does not use illicit drugs. none otherwise stated in nurses  notes  Family History: none otherwise stated in nurses notes  Psychiatric History: none otherwise stated in nurses notes    Allergies:is allergic to amoxicillin and other.      PHYSICAL EXAM     INITIAL VITALS:   Visit Vitals   ??? BP 117/73   ??? Pulse 56   ??? Temp 98.4 ??F (36.9 ??C)   ??? Resp 16   ??? Ht 5\' 6"  (1.676 m)   ??? Wt 200 lb (90.7 kg)   ??? SpO2 97%   ??? BMI 32.28 kg/m2     Physical Exam   Constitutional: She is oriented to person, place, and time and well-developed, well-nourished, and in no distress. No distress.   HENT:   Head: Normocephalic and atraumatic.   Right Ear: Hearing, tympanic membrane, external ear and ear canal normal. No mastoid tenderness.   Left Ear: Hearing, tympanic membrane, external ear and ear canal normal. No mastoid tenderness.   Nose: Nose normal.   Mouth/Throat: Oropharynx is clear and moist and mucous membranes are normal. She does not have dentures. No oral lesions. No trismus in the jaw. Abnormal dentition. Dental caries present. No dental abscesses, uvula swelling or lacerations. No oropharyngeal exudate, posterior oropharyngeal edema, posterior oropharyngeal erythema or tonsillar abscesses.       Dental caries  present in upper left molar.  Tenderness to palpitation of upper left molar.  No signs of abscess or ludwigs sign.  No drainage.  No lesions or abrasions.  No broken teeth.  No swelling or airway. No tenderness with palpitation of temporomandibular joint.    Eyes: Conjunctivae and EOM are normal. Pupils are equal, round, and reactive to light. Right eye exhibits no discharge. Left eye exhibits no discharge.   Neck: Normal range of motion and full passive range of motion without pain. No tracheal deviation present.   Cardiovascular: Normal rate, regular rhythm and normal heart sounds.  Exam reveals no gallop and no friction rub.    No murmur heard.  Pulmonary/Chest: Effort normal and breath sounds normal. No stridor. No respiratory distress. She has no wheezes. She has no rales.  She exhibits no tenderness.   Abdominal: Soft. She exhibits no distension and no mass. There is no tenderness. There is no rebound and no guarding.   Musculoskeletal: Normal range of motion.   Lymphadenopathy:     She has no cervical adenopathy.   Neurological: She is alert and oriented to person, place, and time. No cranial nerve deficit. She exhibits normal muscle tone. Gait normal. Coordination normal. GCS score is 15.   Skin: Skin is warm and dry. No rash noted. She is not diaphoretic.   Nursing note and vitals reviewed.        EMERGENCY DEPARTMENT COURSE:   Pain meds and antibiotic prescriptions.  Pt provided with dental clinic list and instructed to call as soon as possible for an appointment.   Instructed to return for worsening or any new symptoms including throat swelling, difficulty swallowing or breathing. Pt agrees.       FINAL IMPRESSION:     1. Dental caries    2. Left ear pain    3. Dizziness          DISPOSITION:  DISPOSITION Decision to Discharge      PATIENT REFERRED TO:  your dentist     Schedule an appointment as soon as possible for a visit in 3 days      [address] [place]  [address]  [place] [address]  [phone]    Schedule an appointment as soon as possible for a visit in 1 day      [facility]  [address]  [place] [address]  [phone]    If symptoms worsen      DISCHARGE MEDICATIONS:  Discharge Medication List as of 06/03/2015  1:18 PM      START taking these medications    Details   acetaminophen-codeine (TYLENOL/CODEINE #3) 300-30 MG per tablet Take 1 tablet by mouth every 4 hours as needed for Pain, Disp-15 tablet, R-0      clindamycin (CLEOCIN) 300 MG capsule Take 1 capsule by mouth 3 times daily for 10 days, Disp-30 capsule, R-0      meclizine (ANTIVERT) 25 MG tablet Take 1 tablet by mouth 3 times daily as needed (PRN for dizziness), Disp-20 tablet, R-0             (Please note that portions of this note were completed with a voice recognition program.   Efforts were made to edit the dictations but occasionally words are mis-transcribed.)    Letta Pate, PA-C           Letta Pate, PA-C  06/03/15 1349

## 2015-06-03 NOTE — ED Provider Notes (Signed)
[facility]  eMERGENCY dEPARTMENT eNCOUnter   Attending Attestation     Pt Name: Ashley Burgess  MRN: [id number]  Birthdate [birth year]  Date of evaluation: 06/03/15       Ashley Burgess is a 59 y.o. female who presents with Dental Pain (Radiating to lt ear pain)      MDM:   Left-sided upper toothache radiating the left ear with some dizziness.  Suspect dental caries.  See APC note, prescription for antibiotics and meclizine.  FU dentist and pcp Monday.      I personally evaluated and examined the patient in conjunction with the APC and agree with the assessment, treatment plan, and disposition of the patient as recorded by the APC.      Tamsen SniderWesley D Rondle Lohse, MD  Attending Emergency  Physician            Tamsen SniderWesley D June Vacha, MD  06/03/15 [phone]

## 2016-04-16 ENCOUNTER — Encounter: Payer: Self-pay | Admitting: Family Medicine

## 2016-08-21 ENCOUNTER — Encounter: Payer: Self-pay | Admitting: Family Medicine

## 2016-11-27 ENCOUNTER — Encounter: Payer: Self-pay | Admitting: Family Medicine

## 2017-08-02 ENCOUNTER — Encounter: Payer: Self-pay | Admitting: Family Medicine

## 2017-08-26 ENCOUNTER — Encounter: Payer: Self-pay | Admitting: Family Medicine

## 2017-08-26 ENCOUNTER — Ambulatory Visit (INDEPENDENT_AMBULATORY_CARE_PROVIDER_SITE_OTHER): Payer: 59 | Admitting: Family Medicine

## 2017-08-26 VITALS — BP 126/84 | HR 71 | Temp 97.8°F | Resp 12 | Ht 67.52 in | Wt 212.4 lb

## 2017-08-26 DIAGNOSIS — E039 Hypothyroidism, unspecified: Secondary | ICD-10-CM

## 2017-08-26 DIAGNOSIS — F325 Major depressive disorder, single episode, in full remission: Secondary | ICD-10-CM | POA: Diagnosis not present

## 2017-08-26 DIAGNOSIS — E785 Hyperlipidemia, unspecified: Secondary | ICD-10-CM | POA: Diagnosis not present

## 2017-08-26 DIAGNOSIS — L298 Other pruritus: Secondary | ICD-10-CM | POA: Diagnosis not present

## 2017-08-26 DIAGNOSIS — F339 Major depressive disorder, recurrent, unspecified: Secondary | ICD-10-CM | POA: Insufficient documentation

## 2017-08-26 DIAGNOSIS — L409 Psoriasis, unspecified: Secondary | ICD-10-CM | POA: Diagnosis not present

## 2017-08-26 MED ORDER — ESCITALOPRAM OXALATE 20 MG PO TABS: 20.0000 mg | ORAL_TABLET | Freq: Every day | ORAL | 1 refills | Status: DC

## 2017-08-26 MED ORDER — CLOBETASOL PROPIONATE 0.05 % EX FOAM: Freq: Two times a day (BID) | CUTANEOUS | 0 refills | Status: DC

## 2017-08-26 MED ORDER — KETOCONAZOLE 2 % EX SHAM: 1.0000 "application " | MEDICATED_SHAMPOO | CUTANEOUS | 0 refills | Status: DC

## 2017-08-26 NOTE — Progress Notes (Addendum)
HPI:   Jessica Fry is a 62 y.o. female, who is here today to establish care with me.  Former PCP: She just moved from [place]. Last preventive routine visit: About 3 years ago.  Chronic medical problems: Hyperlipidemia, hypothyroidism,eczema, constipation,depression,and anxiety among some.  Hypothyroidism:  Currently she is on Levothyroxine 100 mcg daily.. Tolerating medication well, no side effects reported. She has not noted dysphagia, palpitations, abdominal pain, changes in bowel habits, tremor, cold/heat intolerance, or abnormal weight loss.  Last lab work about a year ago.  HLD: She is currently on Simvastatin 10 mg daily. She does not exercise regularly, trying to eat healthy.  Tolerating medication well,no side effects reported.   Concerns today: Needs refills on Lexapro.  Hx of anxiety and depression,Dx about 7 years ago. She has not seen psychiatrist but she was doing counseling in the past. She has been on Lexapro for years,she still feels like medications helps.  She also hopes that she will feel better living in this area, [place].  She denies suicidal thoughts.  She lives alone. No family here in [place]. 2 sons,one lives in [place], [place] and the other one in [place].   Skin rash: 2 years Hx. Reporting Hx of eczema,she is on Triamcinolone cream. According to pt, she was evaluated by dermatologist and Dx with psoriasis.  Pruritic,intermittent, rash on face,trunk,abdomen, pubic area, and extremities. Scaly pruritis and "flaky" areas. She has not identified exacerbating or alleviating factors. More pruritic during the day.  Topical steroid helps temporarily,she also applies on her face. Has right peri ocular rash, no conjunctival involvement.  She wonders if rash is related to allergies.   Review of Systems  Constitutional: Positive for fatigue. Negative for activity change, appetite  change and fever.  HENT: Negative for mouth sores, nosebleeds and trouble swallowing.   Eyes: Negative for redness and visual disturbance.  Respiratory: Negative for cough, shortness of breath and wheezing.   Cardiovascular: Negative for chest pain, palpitations and leg swelling.  Gastrointestinal: Positive for constipation. Negative for abdominal pain, nausea and vomiting.       Negative for changes in bowel habits.  Endocrine: Negative for cold intolerance and heat intolerance.  Genitourinary: Negative for decreased urine volume, dysuria and hematuria.  Musculoskeletal: Negative for gait problem and myalgias.  Skin: Positive for rash. Negative for wound.  Allergic/Immunologic: Positive for environmental allergies.  Neurological: Negative for syncope, weakness and headaches.  Psychiatric/Behavioral: Negative for confusion. The patient is nervous/anxious.       Current Outpatient Medications on File Prior to Visit  Medication Sig Dispense Refill  . BABY ASPIRIN PO Take by mouth daily.    . Cholecalciferol (VITAMIN D3 PO) Take by mouth daily.    Marland Kitchen levothyroxine (SYNTHROID, LEVOTHROID) 100 MCG tablet Take 100 mcg by mouth daily.    . simvastatin (ZOCOR) 10 MG tablet Take 10 mg by mouth daily.     No current facility-administered medications on file prior to visit.      Past Medical History:  Diagnosis Date  . Alcohol addiction (HCC)   . Allergy   . Chicken pox   . Depression   . Eczema   . GERD (gastroesophageal reflux disease)   . Hyperlipidemia   . Thyroid disease    Hypothyroid  . Urine incontinence    Allergies  Allergen Reactions  . Ampicillin Hives  . Penicillins Hives    Family History  Problem  Relation Age of Onset  . Early death Mother   . Miscarriages / [place] Mother   . Alcohol abuse Father   . Depression Father   . Hearing loss Father   . Stroke Father   . Early death Brother   . Heart attack Maternal Grandmother   . Stroke Paternal Grandmother      Social History   Socioeconomic History  . Marital status: Divorced    Spouse name: None  . Number of children: 2  . Years of education: None  . Highest education level: None  Social Needs  . Financial resource strain: None  . Food insecurity - worry: None  . Food insecurity - inability: None  . Transportation needs - medical: None  . Transportation needs - non-medical: None  Occupational History  . None  Tobacco Use  . Smoking status: Never Smoker  . Smokeless tobacco: Never Used  Substance and Sexual Activity  . Alcohol use: No    Frequency: Never    Comment: 3 1/2 years sober  . Drug use: No  . Sexual activity: No    Comment: both previously  Other Topics Concern  . None  Social History Narrative  . None    Vitals:   08/26/17 1132  BP: 126/84  Pulse: 71  Resp: 12  Temp: 97.8 F (36.6 C)  SpO2: 97%    Body mass index is 32.75 kg/m.    Physical Exam  Nursing note and vitals reviewed. Constitutional: She is oriented to person, place, and time. She appears well-developed. No distress.  HENT:  Head: Normocephalic and atraumatic.  Mouth/Throat: Oropharynx is clear and moist and mucous membranes are normal.  Eyes: Conjunctivae are normal. Pupils are equal, round, and reactive to light.  Neck: No tracheal deviation present. No thyroid mass and no thyromegaly present.  Cardiovascular: Normal rate and regular rhythm.  No murmur heard. Pulses:      Dorsalis pedis pulses are 2+ on the right side, and 2+ on the left side.  Respiratory: Effort normal and breath sounds normal. No respiratory distress.  GI: Soft. She exhibits no mass. There is no hepatomegaly. There is no tenderness.  Musculoskeletal: She exhibits no edema or tenderness.  Lymphadenopathy:    She has no cervical adenopathy.  Neurological: She is alert and oriented to person, place, and time. She has normal strength. Gait normal.  Skin: Skin is warm. Rash noted. No erythema.  Erythematous  macular rash scattered on face, abdomen,upper chest and back. A few lesions on scalp. Some plaques on scalp. Right peri ocular lesion,nasal angle. No local heat or induration. Lesions are nor tender.   Psychiatric: Her mood appears anxious.  Fairly groomed, good eye contact.      ASSESSMENT AND PLAN:  Jessica Fry was seen today for establish care.  Diagnoses and all orders for this visit:  Primary hypothyroidism  No changes in current management. She would like to hold on labs until she is back for her CPE.  Hyperlipidemia, unspecified hyperlipidemia type  Tolerating medication well. No changes in current management. Continue low fat diet. FLP next OV.  Depression, major, in remission (HCC)  Stable and otherwise well controlled. No changes in current management. Instructed about warning signs. F/U in 6-12 months.  -     escitalopram (LEXAPRO) 20 MG tablet; Take 1 tablet (20 mg total) by mouth daily.  Psoriasis  Reported by pt. Side effects of topical steroids. Clobetasol recommended for lesions except for those of her face.  She will continue following with dermatologist.  -     clobetasol (OLUX) 0.05 % topical foam; Apply topically 2 (two) times daily. -     Ambulatory referral to Dermatology  Chronic pruritic rash in adult  Some of lesions seem seborrheic dermatitis, mainly those on scalp but could also be psoriasis. She will try Nizoral shampoo and see if it helps.  -     ketoconazole (NIZORAL) 2 % shampoo; Apply 1 application topically 2 (two) times a week.    I will see her back in 3 months for her CPE.    Preslei Blakley G. [place]az[place], MD  [facility] [place] office.

## 2017-08-26 NOTE — Patient Instructions (Signed)
A few things to remember from today's visit:   Primary hypothyroidism  Hyperlipidemia, unspecified hyperlipidemia type  Depression, major, in remission (HCC)  No changes in current medications.  Please be sure medication list is accurate. If a new problem present, please set up appointment sooner than planned today.

## 2017-09-02 ENCOUNTER — Ambulatory Visit: Payer: Self-pay | Admitting: Family Medicine

## 2017-11-05 ENCOUNTER — Telehealth: Payer: Self-pay | Admitting: Family Medicine

## 2017-11-05 NOTE — Telephone Encounter (Signed)
Request for refill of Synthroid previously filled by historical provider   LOV: 08/26/17 Dr. Swaziland.   [facility] [address]

## 2017-11-05 NOTE — Telephone Encounter (Unsigned)
Copied from CRM [id number]. Topic: Quick Communication - See Telephone Encounter >> Nov 05, 2017  1:57 PM Waymon Amato wrote: Pt is looking to refill on her levothyvoxine  [facility] [address]   Best number [phone]

## 2017-11-06 NOTE — Telephone Encounter (Signed)
Message sent to Dr. Jordan for review and approval. 

## 2017-11-07 ENCOUNTER — Other Ambulatory Visit: Payer: Self-pay | Admitting: Family Medicine

## 2017-11-07 DIAGNOSIS — E039 Hypothyroidism, unspecified: Secondary | ICD-10-CM

## 2017-11-07 MED ORDER — LEVOTHYROXINE SODIUM 100 MCG PO TABS: 100.0000 ug | ORAL_TABLET | Freq: Every day | ORAL | 0 refills | Status: DC

## 2017-11-07 NOTE — Telephone Encounter (Signed)
Rx for Levothyroxine 100 mcg was sent to her pharmacy. Thanks, BJ

## 2017-11-19 ENCOUNTER — Encounter: Payer: Self-pay | Admitting: Family Medicine

## 2017-11-19 ENCOUNTER — Ambulatory Visit (INDEPENDENT_AMBULATORY_CARE_PROVIDER_SITE_OTHER): Payer: 59 | Admitting: Family Medicine

## 2017-11-19 VITALS — BP 130/70 | HR 60 | Temp 98.1°F | Ht 67.52 in | Wt 213.2 lb

## 2017-11-19 DIAGNOSIS — Z1159 Encounter for screening for other viral diseases: Secondary | ICD-10-CM | POA: Diagnosis not present

## 2017-11-19 DIAGNOSIS — Z1231 Encounter for screening mammogram for malignant neoplasm of breast: Secondary | ICD-10-CM | POA: Diagnosis not present

## 2017-11-19 DIAGNOSIS — Z9189 Other specified personal risk factors, not elsewhere classified: Secondary | ICD-10-CM

## 2017-11-19 DIAGNOSIS — Z0000 Encounter for general adult medical examination without abnormal findings: Secondary | ICD-10-CM

## 2017-11-19 DIAGNOSIS — E785 Hyperlipidemia, unspecified: Secondary | ICD-10-CM | POA: Diagnosis not present

## 2017-11-19 DIAGNOSIS — E039 Hypothyroidism, unspecified: Secondary | ICD-10-CM

## 2017-11-19 DIAGNOSIS — Z1239 Encounter for other screening for malignant neoplasm of breast: Secondary | ICD-10-CM

## 2017-11-19 LAB — LIPID PANEL
CHOL/HDL RATIO: 4
Cholesterol: 223 mg/dL — ABNORMAL HIGH (ref 0–200)
HDL: 60 mg/dL (ref >39.00)
LDL CALC: 125 mg/dL — AB (ref 0–99)
NonHDL: 163.3
Triglycerides: 190 mg/dL — ABNORMAL HIGH (ref 0.0–149.0)
VLDL: 38 mg/dL (ref 0.0–40.0)

## 2017-11-19 LAB — COMPREHENSIVE METABOLIC PANEL
ALK PHOS: 63 U/L (ref 39–117)
ALT: 17 U/L (ref 0–35)
AST: 16 U/L (ref 0–37)
Albumin: 4.2 g/dL (ref 3.5–5.2)
BUN: 19 mg/dL (ref 6–23)
CALCIUM: 9.7 mg/dL (ref 8.4–10.5)
CO2: 27 mEq/L (ref 19–32)
Chloride: 107 mEq/L (ref 96–112)
Creatinine, Ser: 0.88 mg/dL (ref 0.40–1.20)
GFR: 68.89 mL/min (ref >60.00)
Glucose, Bld: 96 mg/dL (ref 70–99)
POTASSIUM: 4.5 meq/L (ref 3.5–5.1)
Sodium: 144 mEq/L (ref 135–145)
Total Bilirubin: 0.5 mg/dL (ref 0.2–1.2)
Total Protein: 7 g/dL (ref 6.0–8.3)

## 2017-11-19 LAB — TSH: TSH: 6.77 u[IU]/mL — AB (ref 0.35–4.50)

## 2017-11-19 NOTE — Patient Instructions (Addendum)
A few things to remember from today's visit:   Routine general medical examination at a health care facility  Primary hypothyroidism - Plan: TSH  Hyperlipidemia, unspecified hyperlipidemia type - Plan: Lipid panel, Comprehensive metabolic panel  Encounter for HCV screening test for high risk patient - Plan: Hepatitis C antibody screen  Breast cancer screening - Plan: Mammogram Digital Screening  Today you have you routine preventive visit.  At least 150 minutes of moderate exercise per week, daily brisk walking for 15-30 min is a good exercise option. Healthy diet low in saturated (animal) fats and sweets and consisting of fresh fruits and vegetables, lean meats such as fish and white chicken and whole grains.  These are some of recommendations for screening depending of age and risk factors:   - Vaccines:  Tdap vaccine every 10 years.  Shingles vaccine recommended at age 43, could be given after 64 years of age but not sure about insurance coverage.   Pneumonia vaccines:  Prevnar 13 at 65 and Pneumovax at 66. Sometimes Pneumovax is giving earlier if history of smoking, lung disease,diabetes,kidney disease among some.    Screening for diabetes at age 45 and every 3 years.  Cervical cancer prevention:  Pap smear starts at 64 years of age and continues periodically until 64 years old in low risk women. Pap smear every 3 years between 67 and 65 years old. Pap smear every 3-5 years between women 30 and older if pap smear negative and HPV screening negative.   -Breast cancer: Mammogram: There is disagreement between experts about when to start screening in low risk asymptomatic female but recent recommendations are to start screening at 57 and not later than 64 years old , every 1-2 years and after 64 yo q 2 years. Screening is recommended until 64 years old but some women can continue screening depending of healthy issues.   Colon cancer screening: starts at 64 years old until  64 years old.  Cholesterol disorder screening at age 25 and every 3 years.  Also recommended:  1. Dental visit- Brush and floss your teeth twice daily; visit your dentist twice a year. 2. Eye doctor- Get an eye exam at least every 2 years. 3. Helmet use- Always wear a helmet when riding a bicycle, motorcycle, rollerblading or skateboarding. 4. Safe sex- If you may be exposed to sexually transmitted infections, use a condom. 5. Seat belts- Seat belts can save your live; always wear one. 6. Smoke/Carbon Monoxide detectors- These detectors need to be installed on the appropriate level of your home. Replace batteries at least once a year. 7. Skin cancer- When out in the sun please cover up and use sunscreen 15 SPF or higher. 8. Violence- If anyone is threatening or hurting you, please tell your healthcare provider.  9. Drink alcohol in moderation- Limit alcohol intake to one drink or less per day. Never drink and drive.  Please be sure medication list is accurate. If a new problem present, please set up appointment sooner than planned today.

## 2017-11-19 NOTE — Progress Notes (Signed)
HPI:   Ms.Allesha Keniah Klemmer is a 62 y.o. female, who is here today for her routine physical.  Last CPE: 3 years ago.  Regular exercise 3 or more time per week: None. Following a healthy diet: Yes. She lives alone.   Pap smear a year ago. Hx of abnormal pap smears: Denies. Hx of STD's No M: 13 LMP at 62 yo. G:2 L:2  There is no immunization history on file for this patient.  Mammogram: 5 years ago. Colonoscopy: 6 years ago, 10 years follow up per pt report. DEXA: N/A  Hep C screening: Not done in the past.  She has no concerns today.   Depression: She is on Lexapro 20 mg, which she feels like it is helping.past Hx of alcohol abuse, attends AA meetings. Hypothyroidism: She is on Levothyroxine 100 mcg daily. HLD: She is on Simvastatin 10 mg daily. She has not been consistent with a low fat diet.    Review of Systems  Constitutional: Negative for appetite change, fatigue, fever and unexpected weight change.  HENT: Negative for dental problem, hearing loss, nosebleeds, sore throat and trouble swallowing.   Eyes: Negative for redness and visual disturbance.  Respiratory: Negative for cough, shortness of breath and wheezing.   Cardiovascular: Negative for chest pain and leg swelling.  Gastrointestinal: Negative for abdominal pain, blood in stool, nausea and vomiting.       No changes in bowel habits.  Endocrine: Negative for cold intolerance, heat intolerance, polydipsia, polyphagia and polyuria.  Genitourinary: Negative for decreased urine volume, dysuria, hematuria, menstrual problem, vaginal bleeding and vaginal discharge.       No breast tenderness or nipple discharge.  Musculoskeletal: Negative for gait problem and myalgias.  Skin: Negative for rash.  Neurological: Negative for syncope, weakness, numbness and headaches.  Hematological: Negative for adenopathy. Does not bruise/bleed easily.  Psychiatric/Behavioral: Negative for confusion and sleep  disturbance. The patient is not nervous/anxious.   All other systems reviewed and are negative.     Current Outpatient Medications on File Prior to Visit  Medication Sig Dispense Refill  . BABY ASPIRIN PO Take by mouth daily.    . Cholecalciferol (VITAMIN D3 PO) Take by mouth daily.    . clobetasol (OLUX) 0.05 % topical foam Apply topically 2 (two) times daily. 50 g 0  . escitalopram (LEXAPRO) 20 MG tablet Take 1 tablet (20 mg total) by mouth daily. 90 tablet 1  . ketoconazole (NIZORAL) 2 % shampoo Apply 1 application topically 2 (two) times a week. 120 mL 0  . levothyroxine (SYNTHROID, LEVOTHROID) 100 MCG tablet Take 1 tablet (100 mcg total) by mouth daily. 90 tablet 0  . simvastatin (ZOCOR) 10 MG tablet Take 10 mg by mouth daily.     No current facility-administered medications on file prior to visit.      Past Medical History:  Diagnosis Date  . Alcohol addiction (HCC)   . Allergy   . Chicken pox   . Depression   . Eczema   . GERD (gastroesophageal reflux disease)   . Hyperlipidemia   . Thyroid disease    Hypothyroid  . Urine incontinence     Past Surgical History:  Procedure Laterality Date  . nose-deviated septum injury  1972    Allergies  Allergen Reactions  . Ampicillin Hives  . Penicillins Hives    Family History  Problem Relation Age of Onset  . Early death Mother   . Miscarriages / [place] Mother   .  Alcohol abuse Father   . Depression Father   . Hearing loss Father   . Stroke Father   . Early death Brother   . Heart attack Maternal Grandmother   . Stroke Paternal Grandmother     Social History   Socioeconomic History  . Marital status: Divorced    Spouse name: Not on file  . Number of children: 2  . Years of education: Not on file  . Highest education level: Not on file  Occupational History  . Not on file  Social Needs  . Financial resource strain: Not on file  . Food insecurity:    Worry: Not on file    Inability: Not on file    . Transportation needs:    Medical: Not on file    Non-medical: Not on file  Tobacco Use  . Smoking status: Never Smoker  . Smokeless tobacco: Never Used  Substance and Sexual Activity  . Alcohol use: No    Frequency: Never    Comment: 3 1/2 years sober  . Drug use: No  . Sexual activity: Never    Comment: both previously  Lifestyle  . Physical activity:    Days per week: Not on file    Minutes per session: Not on file  . Stress: Not on file  Relationships  . Social connections:    Talks on phone: Not on file    Gets together: Not on file    Attends religious service: Not on file    Active member of club or organization: Not on file    Attends meetings of clubs or organizations: Not on file    Relationship status: Not on file  Other Topics Concern  . Not on file  Social History Narrative  . Not on file     Vitals:   11/19/17 0928  BP: 130/70  Pulse: 60  Temp: 98.1 F (36.7 C)  SpO2: 96%   Body mass index is 32.89 kg/m.   Wt Readings from Last 3 Encounters:  11/19/17 213 lb 4 oz (96.7 kg)  08/26/17 212 lb 6 oz (96.3 kg)     Physical Exam  Nursing note and vitals reviewed. Constitutional: She is oriented to person, place, and time. She appears well-developed. No distress.  HENT:  Head: Normocephalic and atraumatic.  Right Ear: Hearing, tympanic membrane, external ear and ear canal normal.  Left Ear: Hearing, tympanic membrane, external ear and ear canal normal.  Mouth/Throat: Uvula is midline, oropharynx is clear and moist and mucous membranes are normal.  Eyes: Pupils are equal, round, and reactive to light. Conjunctivae and EOM are normal.  Neck: No tracheal deviation present. No thyromegaly present.  Cardiovascular: Normal rate and regular rhythm.  No murmur heard. Pulses:      Dorsalis pedis pulses are 2+ on the right side, and 2+ on the left side.  Respiratory: Effort normal and breath sounds normal. No respiratory distress.  GI: Soft. She  exhibits no mass. There is no hepatomegaly. There is no tenderness.  Genitourinary:  Genitourinary Comments: Breast: No masses,skin abnormalities,or nipple discharge.  Musculoskeletal: She exhibits no edema.  No major deformity or sing of synovitis appreciated.  Lymphadenopathy:    She has no cervical adenopathy.       Right: No supraclavicular adenopathy present.       Left: No supraclavicular adenopathy present.  Neurological: She is alert and oriented to person, place, and time. She has normal strength. No cranial nerve deficit. Coordination and gait normal.  Reflex Scores:      Bicep reflexes are 2+ on the right side and 2+ on the left side.      Patellar reflexes are 2+ on the right side and 2+ on the left side. Skin: Skin is warm. Rash noted. No erythema.  Psoriasis plaques scattered on scalp,trunk and extremities.Marland Kitchen   Psychiatric: She has a normal mood and affect. Her speech is normal.  Well groomed, good eye contact.    ASSESSMENT AND PLAN:  Ms. Danissa Rundle was here today annual physical examination.   Orders Placed This Encounter  Procedures  . Mammogram Digital Screening  . Lipid panel  . Comprehensive metabolic panel  . TSH  . Hepatitis C antibody screen   Lab Results  Component Value Date   CHOL 223 (H) 11/19/2017   HDL 60.00 11/19/2017   LDLCALC 125 (H) 11/19/2017   TRIG 190.0 (H) 11/19/2017   CHOLHDL 4 11/19/2017   Lab Results  Component Value Date   ALT 17 11/19/2017   AST 16 11/19/2017   ALKPHOS 63 11/19/2017   BILITOT 0.5 11/19/2017   Lab Results  Component Value Date   TSH 6.77 (H) 11/19/2017     Routine general medical examination at a health care facility  We discussed the importance of regular physical activity and healthy diet for prevention of chronic illness and/or complications. Preventive guidelines reviewed. Vaccination up to date per pt report. Tdap 2 years ago and zoster vaccine at 9.  Ca++ and vit D supplementation  recommended. Next CPE in a year.  Primary hypothyroidism  No changes in current management, will follow labs done today and will give further recommendations accordingly.  -     TSH  Hyperlipidemia, unspecified hyperlipidemia type  Continue Simvastatin 10 mg daily. Low fat diet also recommended. Adjustments if needed depending of lab results.  -     Lipid panel -     Comprehensive metabolic panel  Encounter for HCV screening test for high risk patient -     Hepatitis C antibody screen  Breast cancer screening -     Mammogram Digital Screening; Future     Return in 1 year (on 11/20/2018).      Elmore Hyslop G. [place], MD  [facility]. [place] office.

## 2017-11-20 LAB — HEPATITIS C ANTIBODY
HEP C AB: NONREACTIVE
SIGNAL TO CUT-OFF: 0.01 (ref <1.00)

## 2017-11-25 ENCOUNTER — Encounter: Payer: Self-pay | Admitting: *Deleted

## 2017-11-25 ENCOUNTER — Ambulatory Visit: Payer: 59 | Admitting: Family Medicine

## 2017-12-09 ENCOUNTER — Telehealth: Payer: Self-pay | Admitting: *Deleted

## 2017-12-09 NOTE — Telephone Encounter (Signed)
Refill request for Simvastatin 10 mg tablet by mouth daily

## 2017-12-09 NOTE — Telephone Encounter (Signed)
Simvastatin dose was increased from 10 mg to 20 mg after her last visit. Is she still taking simvastatin 10 mg?  Thanks, BJ

## 2017-12-10 ENCOUNTER — Other Ambulatory Visit: Payer: Self-pay | Admitting: *Deleted

## 2017-12-10 DIAGNOSIS — E785 Hyperlipidemia, unspecified: Secondary | ICD-10-CM

## 2017-12-10 NOTE — Telephone Encounter (Signed)
Patient stated that she is taking the 20 mg Simvastatin as discussed in the last OV.

## 2017-12-16 ENCOUNTER — Other Ambulatory Visit: Payer: Self-pay | Admitting: *Deleted

## 2017-12-16 MED ORDER — SIMVASTATIN 20 MG PO TABS: 20.0000 mg | ORAL_TABLET | Freq: Every day | ORAL | 3 refills | Status: DC

## 2017-12-16 NOTE — Telephone Encounter (Signed)
Rx sent to pharmacy, 20 mg Zocor.

## 2017-12-16 NOTE — Telephone Encounter (Signed)
Pt is asking about her med refill.   Pharm - [facility] [address]

## 2018-01-14 ENCOUNTER — Ambulatory Visit
Admission: RE | Admit: 2018-01-14 | Discharge: 2018-01-14 | Disposition: A | Discharge status: Home / Self Care | Payer: 59 | Source: Ambulatory Visit | Attending: Family Medicine | Admitting: Family Medicine

## 2018-01-14 DIAGNOSIS — Z1239 Encounter for other screening for malignant neoplasm of breast: Secondary | ICD-10-CM

## 2018-01-28 ENCOUNTER — Other Ambulatory Visit: Payer: Self-pay | Admitting: Family Medicine

## 2018-01-28 DIAGNOSIS — E039 Hypothyroidism, unspecified: Secondary | ICD-10-CM

## 2018-02-11 ENCOUNTER — Other Ambulatory Visit: Payer: Self-pay | Admitting: Family Medicine

## 2018-02-11 DIAGNOSIS — F325 Major depressive disorder, single episode, in full remission: Secondary | ICD-10-CM

## 2018-02-18 ENCOUNTER — Ambulatory Visit (INDEPENDENT_AMBULATORY_CARE_PROVIDER_SITE_OTHER): Payer: 59 | Admitting: Family Medicine

## 2018-02-18 ENCOUNTER — Encounter: Payer: Self-pay | Admitting: Family Medicine

## 2018-02-18 VITALS — BP 126/84 | HR 60 | Temp 98.5°F | Resp 12 | Ht 67.52 in | Wt 213.1 lb

## 2018-02-18 DIAGNOSIS — L304 Erythema intertrigo: Secondary | ICD-10-CM | POA: Diagnosis not present

## 2018-02-18 DIAGNOSIS — R197 Diarrhea, unspecified: Secondary | ICD-10-CM

## 2018-02-18 MED ORDER — CLOTRIMAZOLE-BETAMETHASONE 1-0.05 % EX CREA: 1.0000 "application " | TOPICAL_CREAM | Freq: Two times a day (BID) | CUTANEOUS | 0 refills | Status: AC

## 2018-02-18 NOTE — Progress Notes (Signed)
ACUTE VISIT   HPI:  Chief Complaint  Patient presents with  . Generalized Body Aches    sx started a couple of days ago  . Fatigue  . Rash    in groin area    Jessica Fry is a 62 y.o. female, who is here today complaining of 2 days of fatigue,body aches. Today she started with diarrhea, has had 2 stools today.. No nausea or vomiting.  Noted fever but she has had chills.  She came back from a camping festival. No recent travel. No sick contact. No suspicious food intake. No insect bite or swimming in rivers/lakes.   No new medications or recent abx treatment.  She denies decreased urine output,dysuria,or gross hematuria.Urine seems darker and "strong." No blood or mucus in stools.   She has not tried OTC. Symptoms are otherwise stable.   Rash:  Pruritic,noted about 4 days ago in groins She has had similar rash before,exacerbated by sweating and heat.  She has applied OTC cream,"Tinating"    Review of Systems  Constitutional: Positive for appetite change and fatigue. Negative for chills, fever and unexpected weight change.  HENT: Negative for congestion, mouth sores, sore throat and trouble swallowing.   Respiratory: Negative for cough, shortness of breath and wheezing.   Cardiovascular: Negative for chest pain, palpitations and leg swelling.  Gastrointestinal: Positive for diarrhea. Negative for abdominal pain, blood in stool, nausea and vomiting.  Genitourinary: Negative for decreased urine volume, dysuria and hematuria.  Musculoskeletal: Positive for myalgias. Negative for gait problem.  Skin: Positive for rash. Negative for wound.  Neurological: Negative for weakness and headaches.  Hematological: Negative for adenopathy. Does not bruise/bleed easily.      Current Outpatient Medications on File Prior to Visit  Medication Sig Dispense Refill  . BABY ASPIRIN PO Take by mouth daily.    . Cholecalciferol (VITAMIN D3 PO) Take by  mouth daily.    . clobetasol (OLUX) 0.05 % topical foam Apply topically 2 (two) times daily. 50 g 0  . escitalopram (LEXAPRO) 20 MG tablet TAKE 1 TABLET BY MOUTH EVERY DAY 90 tablet 2  . levothyroxine (SYNTHROID, LEVOTHROID) 100 MCG tablet TAKE 1 TABLET BY MOUTH EVERY DAY 30 tablet 2  . simvastatin (ZOCOR) 20 MG tablet Take 1 tablet (20 mg total) by mouth at bedtime. 90 tablet 3   No current facility-administered medications on file prior to visit.      Past Medical History:  Diagnosis Date  . Alcohol addiction (HCC)   . Allergy   . Chicken pox   . Depression   . Eczema   . GERD (gastroesophageal reflux disease)   . Hyperlipidemia   . Thyroid disease    Hypothyroid  . Urine incontinence    Allergies  Allergen Reactions  . Ampicillin Hives  . Penicillins Hives    Social History   Socioeconomic History  . Marital status: Divorced    Spouse name: Not on file  . Number of children: 2  . Years of education: Not on file  . Highest education level: Not on file  Occupational History  . Not on file  Social Needs  . Financial resource strain: Not on file  . Food insecurity:    Worry: Not on file    Inability: Not on file  . Transportation needs:    Medical: Not on file    Non-medical: Not on file  Tobacco Use  . Smoking status: Never Smoker  . Smokeless tobacco:  Never Used  Substance and Sexual Activity  . Alcohol use: No    Frequency: Never    Comment: 3 1/2 years sober  . Drug use: No  . Sexual activity: Never    Comment: both previously  Lifestyle  . Physical activity:    Days per week: Not on file    Minutes per session: Not on file  . Stress: Not on file  Relationships  . Social connections:    Talks on phone: Not on file    Gets together: Not on file    Attends religious service: Not on file    Active member of club or organization: Not on file    Attends meetings of clubs or organizations: Not on file    Relationship status: Not on file  Other Topics  Concern  . Not on file  Social History Narrative  . Not on file    Vitals:   02/18/18 1456  BP: 126/84  Pulse: 60  Resp: 12  Temp: 98.5 F (36.9 C)  SpO2: 96%   Body mass index is 32.87 kg/m.   Physical Exam  Nursing note and vitals reviewed. Constitutional: She is oriented to person, place, and time. She appears well-developed. She does not appear ill. No distress.  HENT:  Head: Normocephalic and atraumatic.  Mouth/Throat: Oropharynx is clear and moist. Mucous membranes are dry.  Eyes: Conjunctivae are normal. No scleral icterus.  Cardiovascular: Normal rate and regular rhythm.  No murmur heard. Respiratory: Effort normal and breath sounds normal. No respiratory distress.  GI: Soft. Bowel sounds are normal. She exhibits no distension and no mass. There is no hepatomegaly. There is no tenderness. There is no CVA tenderness.  Musculoskeletal: She exhibits no edema or tenderness.  Lymphadenopathy:    She has no cervical adenopathy.  Neurological: She is alert and oriented to person, place, and time. She has normal strength.  Skin: Skin is warm. Rash noted. Rash is macular. No erythema.  Macular no indurated or tender rash ,erythematous on inguinal area. No local heat.  Psychiatric: She has a normal mood and affect.  Well groomed, good eye contact.      ASSESSMENT AND PLAN:   [name] was seen today for generalized body aches, fatigue and rash.  Diagnoses and all orders for this visit:  Diarrhea, unspecified type  Most likely viral etiology (?colitis,gastroenteritis). Symptomatic treatment recommended for now. She was instructed to monitor for new symptoms. Other possible causes discussed but at this time I do not think further work-up is necessary.  OTC Imodium could be used but I do not recommend unless 6 or more stools daily. Oral hydration, Gatorade or Pedialyte are good options. Bland and light diet if tolerated. Clearly instructed about warning  signs. F/U as needed.   Intertrigo  Instructed trying to keep area dry. Monitor for signs of infection. Topical Lotrisone bid x 14 days max and then prn. Antibacterial soap to keep area clean. F/U as needed.  -     clotrimazole-betamethasone (LOTRISONE) cream; Apply 1 application topically 2 (two) times daily for 14 days.     Return if symptoms worsen or fail to improve.      Earnstine Meinders G. [place], MD  [facility]. [place] office.

## 2018-02-18 NOTE — Patient Instructions (Signed)
A few things to remember from today's visit:   Intertrigo - Plan: clotrimazole-betamethasone (LOTRISONE) cream  Diarrhea, unspecified type    Symptoms are most likely viral and self-limited. Good hand hygiene is important, decrease risk of transmission. Small and frequent sips of clear fluids, Pedialyte or Gatorade age good options.  BRAT diet: bananas, rice, applesauce, and toast.  If vomiting presents stop solids for 24 hours then resume liquid/soft diet and advance as tolerated.   Notified immediately or seek medical attention if you cannot keep fluids down, decreased urine output, severe abdominal pain, red bright blood per rectum, or changes in mental status.  Follow if not full recovery in 2-3 weeks.   Please be sure medication list is accurate. If a new problem present, please set up appointment sooner than planned today.

## 2018-02-22 ENCOUNTER — Encounter: Payer: Self-pay | Admitting: Family Medicine

## 2018-04-22 ENCOUNTER — Other Ambulatory Visit: Payer: Self-pay | Admitting: Family Medicine

## 2018-04-22 DIAGNOSIS — E039 Hypothyroidism, unspecified: Secondary | ICD-10-CM

## 2018-04-22 NOTE — Telephone Encounter (Signed)
Increased dosage per Dr. [place] and has run out sooner. Hoping the quantity will be updated.

## 2018-04-30 ENCOUNTER — Telehealth: Payer: Self-pay | Admitting: *Deleted

## 2018-04-30 NOTE — Telephone Encounter (Signed)
Copied from CRM [id number]. Topic: Referral - Request for Referral >> Apr 30, 2018  3:53 PM Jessica Fry wrote: Pt didn't make the make appt back in Feb    Has patient seen PCP for this complaint: yes  still having scalp and face issues crust pt said is the issue    Referral for which specialty :    Dermatologist   Preferred provider :  Reason for referral : still having scalp and face issues crust pt said is the issue

## 2018-04-30 NOTE — Telephone Encounter (Signed)
Message sent to Dr. Jordan for review and approval. 

## 2018-05-09 ENCOUNTER — Other Ambulatory Visit: Payer: Self-pay | Admitting: *Deleted

## 2018-05-09 DIAGNOSIS — L409 Psoriasis, unspecified: Secondary | ICD-10-CM

## 2018-05-09 DIAGNOSIS — L298 Other pruritus: Secondary | ICD-10-CM

## 2018-05-09 NOTE — Telephone Encounter (Signed)
Referral placed as requested.

## 2018-05-09 NOTE — Telephone Encounter (Signed)
It is okay to place referral for dermatologist as requested by patient. Last visit she was complaining of pruritic rash on groin area. Thanks, BJ

## 2018-07-14 ENCOUNTER — Other Ambulatory Visit: Payer: Self-pay | Admitting: Family Medicine

## 2018-07-14 DIAGNOSIS — E039 Hypothyroidism, unspecified: Secondary | ICD-10-CM

## 2018-08-18 ENCOUNTER — Encounter: Payer: Self-pay | Admitting: Family Medicine

## 2018-08-18 ENCOUNTER — Ambulatory Visit: Payer: 59 | Admitting: Family Medicine

## 2018-08-18 VITALS — BP 124/78 | HR 65 | Temp 97.9°F | Resp 16 | Ht 67.52 in | Wt 212.5 lb

## 2018-08-18 DIAGNOSIS — R0989 Other specified symptoms and signs involving the circulatory and respiratory systems: Secondary | ICD-10-CM

## 2018-08-18 DIAGNOSIS — R05 Cough: Secondary | ICD-10-CM

## 2018-08-18 DIAGNOSIS — J988 Other specified respiratory disorders: Secondary | ICD-10-CM

## 2018-08-18 DIAGNOSIS — R059 Cough, unspecified: Secondary | ICD-10-CM

## 2018-08-18 DIAGNOSIS — J989 Respiratory disorder, unspecified: Secondary | ICD-10-CM

## 2018-08-18 MED ORDER — ALBUTEROL SULFATE HFA 108 (90 BASE) MCG/ACT IN AERS: 2.0000 | INHALATION_SPRAY | Freq: Four times a day (QID) | RESPIRATORY_TRACT | 0 refills | Status: DC | PRN

## 2018-08-18 MED ORDER — DOXYCYCLINE HYCLATE 100 MG PO TABS: 100.0000 mg | ORAL_TABLET | Freq: Two times a day (BID) | ORAL | 0 refills | Status: AC

## 2018-08-18 MED ORDER — BENZONATATE 100 MG PO CAPS: 200.0000 mg | ORAL_CAPSULE | Freq: Two times a day (BID) | ORAL | 0 refills | Status: AC | PRN

## 2018-08-18 MED ORDER — PREDNISONE 20 MG PO TABS: 40.0000 mg | ORAL_TABLET | Freq: Every day | ORAL | 0 refills | Status: AC

## 2018-08-18 NOTE — Progress Notes (Signed)
ACUTE VISIT  HPI:  Chief Complaint  Patient presents with  . Chest congestion    sx started 2 weeks ago  . Fever  . Fatigue    [name] is a 63 y.o.female here today complaining of 10 days of respiratory symptoms. She felt like she was doing better but symptoms got worse again. Initially she had sore throat ,fever,body aches,and chills.  Productive cough with yellowish sputum. Exacerbated by deep breathing. SOB and wheezing exacerbated by exertion and alleviated by rest.  No Hx of asthma or tobacco use.    Cough  This is a new problem. The current episode started 1 to 4 weeks ago. The problem has been waxing and waning. The cough is productive of sputum. Associated symptoms include a fever, headaches (pressure like headache a few days ago x 1 day), myalgias, nasal congestion, postnasal drip, rhinorrhea and wheezing. Pertinent negatives include no chest pain, ear pain, eye redness, heartburn, hemoptysis, rash, sore throat or shortness of breath. The symptoms are aggravated by exercise. Risk factors for lung disease include travel. She has tried a beta-agonist inhaler for the symptoms. The treatment provided mild relief. Her past medical history is significant for environmental allergies. There is no history of asthma.   Recently in [place], came back on 08/09/2018,she is not aware of illness exposure while overseas.  No sick contact. No known insect bite.  OTC medications for this problem: None. She has an old Albuterol inh she used,it helped.   Review of Systems  Constitutional: Positive for fatigue and fever. Negative for activity change and appetite change.  HENT: Positive for congestion, postnasal drip and rhinorrhea. Negative for ear pain, sinus pressure, sore throat and trouble swallowing.   Eyes: Negative for discharge and redness.  Respiratory: Positive for cough and wheezing. Negative for hemoptysis and shortness of breath.   Cardiovascular:  Negative for chest pain.  Gastrointestinal: Negative for abdominal pain, diarrhea, heartburn, nausea and vomiting.  Musculoskeletal: Positive for myalgias. Negative for gait problem.  Skin: Negative for rash.  Allergic/Immunologic: Positive for environmental allergies.  Neurological: Positive for headaches (pressure like headache a few days ago x 1 day). Negative for weakness.  Hematological: Negative for adenopathy. Does not bruise/bleed easily.  Psychiatric/Behavioral: Negative for confusion. The patient is nervous/anxious.       Current Outpatient Medications on File Prior to Visit  Medication Sig Dispense Refill  . BABY ASPIRIN PO Take by mouth daily.    . Cholecalciferol (VITAMIN D3 PO) Take by mouth daily.    . clobetasol (OLUX) 0.05 % topical foam Apply topically 2 (two) times daily. 50 g 0  . escitalopram (LEXAPRO) 20 MG tablet TAKE 1 TABLET BY MOUTH EVERY DAY 90 tablet 2  . levothyroxine (SYNTHROID, LEVOTHROID) 100 MCG tablet TAKE 1 AND 1/2 TABLET BY MOUTH MONDAY AND WEDNESDAY AND 1 TABLET ALL OTHER DAYS 35 tablet 2  . simvastatin (ZOCOR) 20 MG tablet Take 1 tablet (20 mg total) by mouth at bedtime. 90 tablet 3   No current facility-administered medications on file prior to visit.      Past Medical History:  Diagnosis Date  . Alcohol addiction (HCC)   . Allergy   . Chicken pox   . Depression   . Eczema   . GERD (gastroesophageal reflux disease)   . Hyperlipidemia   . Thyroid disease    Hypothyroid  . Urine incontinence    Allergies  Allergen Reactions  . Ampicillin Hives  . Penicillins  Hives    Social History   Socioeconomic History  . Marital status: Divorced    Spouse name: Not on file  . Number of children: 2  . Years of education: Not on file  . Highest education level: Not on file  Occupational History  . Not on file  Social Needs  . Financial resource strain: Not on file  . Food insecurity:    Worry: Not on file    Inability: Not on file  .  Transportation needs:    Medical: Not on file    Non-medical: Not on file  Tobacco Use  . Smoking status: Never Smoker  . Smokeless tobacco: Never Used  Substance and Sexual Activity  . Alcohol use: No    Frequency: Never    Comment: 3 1/2 years sober  . Drug use: No  . Sexual activity: Never    Comment: both previously  Lifestyle  . Physical activity:    Days per week: Not on file    Minutes per session: Not on file  . Stress: Not on file  Relationships  . Social connections:    Talks on phone: Not on file    Gets together: Not on file    Attends religious service: Not on file    Active member of club or organization: Not on file    Attends meetings of clubs or organizations: Not on file    Relationship status: Not on file  Other Topics Concern  . Not on file  Social History Narrative  . Not on file    Vitals:   08/18/18 1553  BP: 124/78  Pulse: 65  Resp: 16  Temp: 97.9 F (36.6 C)  SpO2: 97%   Body mass index is 32.77 kg/m.    Physical Exam  Nursing note and vitals reviewed. Constitutional: She is oriented to person, place, and time. She appears well-developed. She does not appear ill. No distress.  HENT:  Head: Normocephalic and atraumatic.  Nose: Rhinorrhea present. Right sinus exhibits no maxillary sinus tenderness and no frontal sinus tenderness. Left sinus exhibits no maxillary sinus tenderness and no frontal sinus tenderness.  Mouth/Throat: Oropharynx is clear and moist and mucous membranes are normal.  Eyes: Conjunctivae are normal.  Cardiovascular: Normal rate and regular rhythm.  No murmur heard. Respiratory: Effort normal. No respiratory distress. She has wheezes.  Lymphadenopathy:       Head (right side): No submandibular adenopathy present.       Head (left side): No submandibular adenopathy present.    She has no cervical adenopathy.  Neurological: She is alert and oriented to person, place, and time. She has normal strength.  Skin: Skin is  warm. No rash noted. No erythema.  Psychiatric: Her mood appears anxious.  Well groomed, good eye contact.      ASSESSMENT AND PLAN:   Ms. Jessica PikesSusan was seen today for chest congestion, fever and fatigue.  Diagnoses and all orders for this visit:  Reactive airway disease that is not asthma Wheezing improved with Duoneb neb. Albuterol inh 2 puff every 6 hours for a week then as needed for wheezing or shortness of breath.  Prednisone side effects discussed,recommend taking med with food. Instructed about warning signs.  -     albuterol (PROVENTIL HFA;VENTOLIN HFA) 108 (90 Base) MCG/ACT inhaler; Inhale 2 puffs into the lungs every 6 (six) hours as needed for up to 30 days for wheezing or shortness of breath. -     predniSONE (DELTASONE) 20 MG tablet;  Take 2 tablets (40 mg total) by mouth daily with breakfast for 5 days.  Respiratory tract infection Most likely viral. Explained that I do not think abx is needed now but she can started if she is feeling any better in  -     doxycycline (VIBRA-TABS) 100 MG tablet; Take 1 tablet (100 mg total) by mouth 2 (two) times daily for 7 days.  Cough Adequate hydration. Explained that cough and congestion can last a few days and sometimes weeks. Lung ausculation negative for rales or rhonchi. Plain mucinex may help.   -     benzonatate (TESSALON) 100 MG capsule; Take 2 capsules (200 mg total) by mouth 2 (two) times daily as needed for up to 10 days.      Return if symptoms worsen or fail to improve.       G. [place], MD  [facility]. [place] [facility].

## 2018-08-18 NOTE — Patient Instructions (Signed)
A few things to remember from today's visit:   Reactive airway disease that is not asthma - Plan: albuterol (PROVENTIL HFA;VENTOLIN HFA) 108 (90 Base) MCG/ACT inhaler, predniSONE (DELTASONE) 20 MG tablet  Respiratory tract infection - Plan: doxycycline (VIBRA-TABS) 100 MG tablet  Albuterol inh 2 puff every 6 hours for a week then as needed for wheezing or shortness of breath.  Take prednisone with food, breakfast. I do not think you need antibiotics at this time but you can fill prescription for doxycycline in for days if you do not feel any better.  Remember that cough and congestion can last a few more days or weeks.  Plain Mucinex may help.   Please be sure medication list is accurate. If a new problem present, please set up appointment sooner than planned today.

## 2018-08-20 ENCOUNTER — Emergency Department (HOSPITAL_COMMUNITY)
Admission: EM | Admit: 2018-08-20 | Discharge: 2018-08-21 | Disposition: A | Discharge status: Home / Self Care | Payer: 59 | Attending: Emergency Medicine | Admitting: Emergency Medicine

## 2018-08-20 ENCOUNTER — Ambulatory Visit: Payer: 59 | Admitting: Family Medicine

## 2018-08-20 ENCOUNTER — Encounter (HOSPITAL_COMMUNITY): Payer: Self-pay | Admitting: Emergency Medicine

## 2018-08-20 ENCOUNTER — Other Ambulatory Visit: Payer: Self-pay

## 2018-08-20 DIAGNOSIS — Z7982 Long term (current) use of aspirin: Secondary | ICD-10-CM | POA: Diagnosis not present

## 2018-08-20 DIAGNOSIS — J209 Acute bronchitis, unspecified: Secondary | ICD-10-CM

## 2018-08-20 DIAGNOSIS — E039 Hypothyroidism, unspecified: Secondary | ICD-10-CM | POA: Insufficient documentation

## 2018-08-20 DIAGNOSIS — Z79899 Other long term (current) drug therapy: Secondary | ICD-10-CM | POA: Diagnosis not present

## 2018-08-20 DIAGNOSIS — R0602 Shortness of breath: Secondary | ICD-10-CM | POA: Diagnosis present

## 2018-08-20 MED ORDER — ALBUTEROL SULFATE (2.5 MG/3ML) 0.083% IN NEBU
5.0000 mg | INHALATION_SOLUTION | Freq: Once | RESPIRATORY_TRACT | Status: AC
  Administered 2018-08-21: 5 mg via RESPIRATORY_TRACT
  Filled 2018-08-20: qty 6

## 2018-08-20 NOTE — ED Triage Notes (Signed)
Patient complaining of sob that has been going on since the weekend. Patient states that she went to her dr this weekend and got a prescription for albuterol and prednisone. Patient states that the medication is not working.

## 2018-08-21 ENCOUNTER — Emergency Department (HOSPITAL_COMMUNITY): Payer: 59

## 2018-08-21 LAB — BASIC METABOLIC PANEL
Anion gap: 12 (ref 5–15)
BUN: 19 mg/dL (ref 8–23)
CO2: 21 mmol/L — ABNORMAL LOW (ref 22–32)
Calcium: 9.4 mg/dL (ref 8.9–10.3)
Chloride: 107 mmol/L (ref 98–111)
Creatinine, Ser: 0.86 mg/dL (ref 0.44–1.00)
GFR calc Af Amer: >60 mL/min (ref >60)
GFR calc non Af Amer: >60 mL/min (ref >60)
Glucose, Bld: 137 mg/dL — ABNORMAL HIGH (ref 70–99)
Potassium: 3 mmol/L — ABNORMAL LOW (ref 3.5–5.1)
Sodium: 140 mmol/L (ref 135–145)

## 2018-08-21 LAB — CBC WITH DIFFERENTIAL/PLATELET
Abs Immature Granulocytes: 0.09 10*3/uL — ABNORMAL HIGH (ref 0.00–0.07)
Basophils Absolute: 0 10*3/uL (ref 0.0–0.1)
Basophils Relative: 0 %
EOS ABS: 0.1 10*3/uL (ref 0.0–0.5)
Eosinophils Relative: 0 %
HCT: 42 % (ref 36.0–46.0)
Hemoglobin: 13.5 g/dL (ref 12.0–15.0)
Immature Granulocytes: 1 %
Lymphocytes Relative: 22 %
Lymphs Abs: 2.6 10*3/uL (ref 0.7–4.0)
MCH: 28.6 pg (ref 26.0–34.0)
MCHC: 32.1 g/dL (ref 30.0–36.0)
MCV: 89 fL (ref 80.0–100.0)
Monocytes Absolute: 1 10*3/uL (ref 0.1–1.0)
Monocytes Relative: 9 %
Neutro Abs: 8.1 10*3/uL — ABNORMAL HIGH (ref 1.7–7.7)
Neutrophils Relative %: 68 %
Platelets: 327 10*3/uL (ref 150–400)
RBC: 4.72 MIL/uL (ref 3.87–5.11)
RDW: 13.5 % (ref 11.5–15.5)
WBC: 11.8 10*3/uL — ABNORMAL HIGH (ref 4.0–10.5)
nRBC: 0 % (ref 0.0–0.2)

## 2018-08-21 LAB — BLOOD GAS, VENOUS
Acid-base deficit: 1.9 mmol/L (ref 0.0–2.0)
Bicarbonate: 21.9 mmol/L (ref 20.0–28.0)
FIO2: 21
O2 Saturation: 80.3 %
PCO2 VEN: 36.2 mmHg — AB (ref 44.0–60.0)
PO2 VEN: 44.8 mmHg (ref 32.0–45.0)
Patient temperature: 37
pH, Ven: 7.399 (ref 7.250–7.430)

## 2018-08-21 LAB — TROPONIN I: Troponin I: <0.03 ng/mL (ref <0.03)

## 2018-08-21 MED ORDER — IPRATROPIUM-ALBUTEROL 0.5-2.5 (3) MG/3ML IN SOLN
3.0000 mL | RESPIRATORY_TRACT | Status: DC
  Administered 2018-08-21: 3 mL via RESPIRATORY_TRACT
  Filled 2018-08-21: qty 3

## 2018-08-21 MED ORDER — GUAIFENESIN ER 600 MG PO TB12: 600.0000 mg | ORAL_TABLET | Freq: Two times a day (BID) | ORAL | 0 refills | Status: DC

## 2018-08-21 MED ORDER — GUAIFENESIN ER 600 MG PO TB12
1200.0000 mg | ORAL_TABLET | Freq: Once | ORAL | Status: AC
  Administered 2018-08-21: 1200 mg via ORAL
  Filled 2018-08-21: qty 2

## 2018-08-21 NOTE — ED Provider Notes (Signed)
[facility]-EMERGENCY DEPT Provider Note: Lowella Dell, MD, FACEP  CSN: [id number] MRN: [id number] ARRIVAL: 08/20/18 at 2332 ROOM: WA18/WA18   CHIEF COMPLAINT  Shortness of Breath   HISTORY OF PRESENT ILLNESS  08/21/18 1:17 AM Jessica Fry is a 63 y.o. female with a one-week history of shortness of breath.  She was seen by her physician 3 days ago and started on albuterol and prednisone.  Despite this her breathing worsened yesterday.  On arrival here she was found to be wheezing and was administered a neb treatment with transient improvement.  She has had a nonproductive cough.  She is not aware of having a fever.  She is having some heartburn but denies nausea, vomiting or diarrhea.  Symptoms are moderate and are associated with anxiety.   Past Medical History:  Diagnosis Date  . Alcohol addiction (HCC)   . Allergy   . Chicken pox   . Depression   . Eczema   . GERD (gastroesophageal reflux disease)   . Hyperlipidemia   . Thyroid disease    Hypothyroid  . Urine incontinence     Past Surgical History:  Procedure Laterality Date  . nose-deviated septum injury  1972    Family History  Problem Relation Age of Onset  . Early death Mother   . Miscarriages / [place] Mother   . Alcohol abuse Father   . Depression Father   . Hearing loss Father   . Stroke Father   . Early death Brother   . Heart attack Maternal Grandmother   . Stroke Paternal Grandmother     Social History   Tobacco Use  . Smoking status: Never Smoker  . Smokeless tobacco: Never Used  Substance Use Topics  . Alcohol use: No    Frequency: Never    Comment: 3 1/2 years sober  . Drug use: No    Prior to Admission medications   Medication Sig Start Date End Date Taking? Authorizing Provider  albuterol (PROVENTIL HFA;VENTOLIN HFA) 108 (90 Base) MCG/ACT inhaler Inhale 2 puffs into the lungs every 6 (six) hours as needed for up to 30 days for wheezing or shortness of breath. 08/18/18 09/17/18 Yes  [place], Betty G, MD  BABY ASPIRIN PO Take 81 mg by mouth daily.    Yes [provider]  benzonatate (TESSALON) 100 MG capsule Take 2 capsules (200 mg total) by mouth 2 (two) times daily as needed for up to 10 days. 08/18/18 08/28/18 Yes [place], Betty G, MD  Cholecalciferol (VITAMIN D3 PO) Take 1 tablet by mouth daily.    Yes [provider]  clobetasol (OLUX) 0.05 % topical foam Apply topically 2 (two) times daily. 08/26/17  Yes [place], Betty G, MD  escitalopram (LEXAPRO) 20 MG tablet TAKE 1 TABLET BY MOUTH EVERY DAY 02/13/18  Yes [place], Betty G, MD  ketoconazole (NIZORAL) 2 % shampoo Apply 1 application topically 2 (two) times a week.   Yes [provider]  levothyroxine (SYNTHROID, LEVOTHROID) 100 MCG tablet TAKE 1 AND 1/2 TABLET BY MOUTH MONDAY AND WEDNESDAY AND 1 TABLET ALL OTHER DAYS 07/14/18  Yes [place], Betty G, MD  predniSONE (DELTASONE) 20 MG tablet Take 2 tablets (40 mg total) by mouth daily with breakfast for 5 days. 08/18/18 08/23/18 Yes [place], Betty G, MD  simvastatin (ZOCOR) 20 MG tablet Take 1 tablet (20 mg total) by mouth at bedtime. 12/16/17  Yes [place], Betty G, MD  doxycycline (VIBRA-TABS) 100 MG tablet Take 1 tablet (100 mg total) by mouth  2 (two) times daily for 7 days. 08/18/18 08/25/18  [place], Betty G, MD    Allergies Ampicillin and Penicillins   REVIEW OF SYSTEMS  Negative except as noted here or in the History of Present Illness.   PHYSICAL EXAMINATION  Initial Vital Signs Blood pressure 139/74, pulse 93, temperature 97.8 F (36.6 C), resp. rate 18, height 5\' 6"  (1.676 m), weight 96.2 kg, SpO2 92 %.  Examination General: Well-developed, well-nourished female in no acute distress; appearance consistent with age of record HENT: normocephalic; atraumatic Eyes: pupils equal, round and reactive to light; extraocular muscles intact Neck: supple Heart: regular rate and rhythm Lungs: clear to auscultation bilaterally; tachypnea; prolonged  expiration Abdomen: soft; nondistended; nontender; bowel sounds present Extremities: No deformity; full range of motion; pulses normal Neurologic: Awake, alert and oriented; motor function intact in all extremities and symmetric; no facial droop Skin: Warm and dry Psychiatric: Anxious   RESULTS  Summary of this visit's results, reviewed by myself:   EKG Interpretation  Date/Time:  Wednesday August 20 2018 23:42:57 EST Ventricular Rate:  75 PR Interval:    QRS Duration: 84 QT Interval:  386 QTC Calculation: 432 R Axis:   32 Text Interpretation:  Sinus rhythm Normal ECG No previous ECGs available Confirmed by Glynn Yepes, Jonny RuizJohn ([id number]) on 08/21/2018 1:18:15 AM      Laboratory Studies: Results for orders placed or performed during the hospital encounter of 08/20/18 (from the past 24 hour(s))  CBC with Differential/Platelet     Status: Abnormal   Collection Time: 08/21/18  2:12 AM  Result Value Ref Range   WBC 11.8 (H) 4.0 - 10.5 K/uL   RBC 4.72 3.87 - 5.11 MIL/uL   Hemoglobin 13.5 12.0 - 15.0 g/dL   HCT 60.442.0 54.036.0 - 98.146.0 %   MCV 89.0 80.0 - 100.0 fL   MCH 28.6 26.0 - 34.0 pg   MCHC 32.1 30.0 - 36.0 g/dL   RDW 19.113.5 47.811.5 - 29.515.5 %   Platelets 327 150 - 400 K/uL   nRBC 0.0 0.0 - 0.2 %   Neutrophils Relative % 68 %   Neutro Abs 8.1 (H) 1.7 - 7.7 K/uL   Lymphocytes Relative 22 %   Lymphs Abs 2.6 0.7 - 4.0 K/uL   Monocytes Relative 9 %   Monocytes Absolute 1.0 0.1 - 1.0 K/uL   Eosinophils Relative 0 %   Eosinophils Absolute 0.1 0.0 - 0.5 K/uL   Basophils Relative 0 %   Basophils Absolute 0.0 0.0 - 0.1 K/uL   Immature Granulocytes 1 %   Abs Immature Granulocytes 0.09 (H) 0.00 - 0.07 K/uL  Basic metabolic panel     Status: Abnormal   Collection Time: 08/21/18  2:12 AM  Result Value Ref Range   Sodium 140 135 - 145 mmol/L   Potassium 3.0 (L) 3.5 - 5.1 mmol/L   Chloride 107 98 - 111 mmol/L   CO2 21 (L) 22 - 32 mmol/L   Glucose, Bld 137 (H) 70 - 99 mg/dL   BUN 19 8 - 23 mg/dL    Creatinine, Ser 6.210.86 0.44 - 1.00 mg/dL   Calcium 9.4 8.9 - 30.810.3 mg/dL   GFR calc non Af Amer >60 >60 mL/min   GFR calc Af Amer >60 >60 mL/min   Anion gap 12 5 - 15  Troponin I - ONCE - STAT     Status: None   Collection Time: 08/21/18  2:12 AM  Result Value Ref Range   Troponin I <0.03 <0.03 ng/mL  Blood gas, venous     Status: Abnormal   Collection Time: 08/21/18  2:20 AM  Result Value Ref Range   FIO2 21.00    Delivery systems ROOM AIR    pH, Ven 7.399 7.250 - 7.430   pCO2, Ven 36.2 (L) 44.0 - 60.0 mmHg   pO2, Ven 44.8 32.0 - 45.0 mmHg   Bicarbonate 21.9 20.0 - 28.0 mmol/L   Acid-base deficit 1.9 0.0 - 2.0 mmol/L   O2 Saturation 80.3 %   Patient temperature 37.0    Collection site VEIN    Drawn by COLLECTED BY NURSE    Sample type VENOUS    Imaging Studies: Dg Chest 2 View  Result Date: 08/21/2018 CLINICAL DATA:  Shortness of breath EXAM: CHEST - 2 VIEW COMPARISON:  None. FINDINGS: Heart and mediastinal contours are within normal limits. No focal opacities or effusions. No acute bony abnormality. IMPRESSION: No active cardiopulmonary disease. Electronically Signed   By: Charlett NoseKevin  Dover M.D.   On: 08/21/2018 00:20    ED COURSE and MDM  Nursing notes and initial vitals signs, including pulse oximetry, reviewed.  Vitals:   08/21/18 0100 08/21/18 0130 08/21/18 0149 08/21/18 0200  BP: 139/74 134/70    Pulse: 93 79    Resp: 18 20  20   Temp:      SpO2: 92% 96% 94%   Weight:      Height:       2:49 AM Patient resting comfortably after additional DuoNeb treatment.  Tachypnea has improved air movement is good with no wheezes heard.  We will place the patient on guaifenesin to help her expectorate mucus.  She was encouraged to use her inhaler as needed and to continue her prednisone taper.  PROCEDURES    ED DIAGNOSES     ICD-10-CM   1. Acute bronchitis with bronchospasm J20.9        Kalyb Pemble, MD 08/21/18 [phone]

## 2018-09-24 ENCOUNTER — Other Ambulatory Visit: Payer: Self-pay | Admitting: Family Medicine

## 2018-09-24 ENCOUNTER — Ambulatory Visit: Payer: Self-pay | Admitting: *Deleted

## 2018-09-24 DIAGNOSIS — J989 Respiratory disorder, unspecified: Secondary | ICD-10-CM

## 2018-09-24 DIAGNOSIS — R0989 Other specified symptoms and signs involving the circulatory and respiratory systems: Secondary | ICD-10-CM

## 2018-09-24 NOTE — Telephone Encounter (Signed)
Pt reports diarrhea, onset 1600 this afternoon, 4 episodes. Denies nausea, vomiting, afebrile. States intermittent abdominal "Cramping." States is staying hydrated and has taken [employer] which has helped "Some." Reports stools were initially loose, now "Mostly" water. Denies lightheadedness, voiding WNL. Home care advise given; verbalizes understanding. Reason for Disposition . MILD-MODERATE diarrhea (e.g., 1-6 times / day more than normal)  Answer Assessment - Initial Assessment Questions 1. DIARRHEA SEVERITY: "How bad is the diarrhea?" "How many extra stools have you had in the past 24 hours than normal?"    - NO DIARRHEA (SCALE 0)   - MILD (SCALE 1-3): Few loose or mushy BMs; increase of 1-3 stools over normal daily number of stools; mild increase in ostomy output.   -  MODERATE (SCALE 4-7): Increase of 4-6 stools daily over normal; moderate increase in ostomy output. * SEVERE (SCALE 8-10; OR 'WORST POSSIBLE'): Increase of 7 or more stools daily over normal; moderate increase in ostomy output; incontinence.     moderate 2. ONSET: "When did the diarrhea begin?"      1600 today 3. BM CONSISTENCY: "How loose or watery is the diarrhea?"      Loose initially, now watery 4. VOMITING: "Are you also vomiting?" If so, ask: "How many times in the past 24 hours?"      no 5. ABDOMINAL PAIN: "Are you having any abdominal pain?" If yes: "What does it feel like?" (e.g., crampy, dull, intermittent, constant)      Intermittent, crampy, lower abdomen 6. ABDOMINAL PAIN SEVERITY: If present, ask: "How bad is the pain?"  (e.g., Scale 1-10; mild, moderate, or severe)   - MILD (1-3): doesn't interfere with normal activities, abdomen soft and not tender to touch    - MODERATE (4-7): interferes with normal activities or awakens from sleep, tender to touch    - SEVERE (8-10): excruciating pain, doubled over, unable to do any normal activities       4-5/10 7. ORAL INTAKE: If vomiting, "Have you been able to  drink liquids?" "How much fluids have you had in the past 24 hours?"     no 8. HYDRATION: "Any signs of dehydration?" (e.g., dry mouth [not just dry lips], too weak to stand, dizziness, new weight loss) "When did you last urinate?"     No 9. EXPOSURE: "Have you traveled to a foreign country recently?" "Have you been exposed to anyone with diarrhea?" "Could you have eaten any food that was spoiled?"     no 10. ANTIBIOTIC USE: "Are you taking antibiotics now or have you taken antibiotics in the past 2 months?"       no 11. OTHER SYMPTOMS: "Do you have any other symptoms?" (e.g., fever, blood in stool)       no  Protocols used: DIARRHEA-A-AH

## 2018-09-26 MED ORDER — ALBUTEROL SULFATE HFA 108 (90 BASE) MCG/ACT IN AERS: 2.0000 | INHALATION_SPRAY | Freq: Four times a day (QID) | RESPIRATORY_TRACT | 0 refills | Status: DC | PRN

## 2018-10-02 ENCOUNTER — Other Ambulatory Visit: Payer: Self-pay | Admitting: Family Medicine

## 2018-10-02 DIAGNOSIS — E039 Hypothyroidism, unspecified: Secondary | ICD-10-CM

## 2018-12-07 ENCOUNTER — Other Ambulatory Visit: Payer: Self-pay | Admitting: Family Medicine

## 2018-12-08 ENCOUNTER — Other Ambulatory Visit: Payer: Self-pay

## 2018-12-08 ENCOUNTER — Ambulatory Visit (INDEPENDENT_AMBULATORY_CARE_PROVIDER_SITE_OTHER): Payer: 59 | Admitting: Family Medicine

## 2018-12-08 DIAGNOSIS — H109 Unspecified conjunctivitis: Secondary | ICD-10-CM | POA: Diagnosis not present

## 2018-12-08 MED ORDER — OLOPATADINE HCL 0.1 % OP SOLN: 1.0000 [drp] | Freq: Two times a day (BID) | OPHTHALMIC | 1 refills | Status: DC

## 2018-12-08 NOTE — Progress Notes (Signed)
Virtual Visit via Video Note   I connected with Ms Bonura on 12/10/18 at  9:00 AM EDT by a video enabled telemedicine application and verified that I am speaking with the correct person using two identifiers.  Location patient: home Location provider: office Persons participating in the virtual visit: patient, provider  I discussed the limitations of evaluation and management by telemedicine and the availability of in person appointments. The patient expressed understanding and agreed to proceed.   HPI: She is a 63 yo female with Hx of allergies,eczema,and depression who is c/o "puffy eyes" and pruritus since yesterday. Lower eyelid mild edema. Right eye epiphora and crusty clear matter.  She has not noted purulent drainage, conjunctival erythema, eye pain, visual changes, or headache. Negative for fever, chills, more fatigue than usual,changes in appetite, rhinorrhea, nasal congestion, sore throat, oral lesions, or cough.  She has had similar symptoms in the past. She has not identified exacerbating or alleviating factors.  Denies edema elsewhere, dyspnea, orthopnea, PND, abdominal pain, nausea, vomiting, gross hematuria, decreased urine output,or foam in urine.  She mentioned that her cat is having similar problem, she has an appointment with the visit. She has not tried OTC medications.  ROS: See pertinent positives and negatives per HPI.  Past Medical History:  Diagnosis Date  . Alcohol addiction (HCC)   . Allergy   . Chicken pox   . Depression   . Eczema   . GERD (gastroesophageal reflux disease)   . Hyperlipidemia   . Thyroid disease    Hypothyroid  . Urine incontinence     Past Surgical History:  Procedure Laterality Date  . nose-deviated septum injury  1972    Family History  Problem Relation Age of Onset  . Early death Mother   . Miscarriages / [place] Mother   . Alcohol abuse Father   . Depression Father   . Hearing loss Father   . Stroke Father    . Early death Brother   . Heart attack Maternal Grandmother   . Stroke Paternal Grandmother     Social History   Socioeconomic History  . Marital status: Divorced    Spouse name: Not on file  . Number of children: 2  . Years of education: Not on file  . Highest education level: Not on file  Occupational History  . Not on file  Social Needs  . Financial resource strain: Not on file  . Food insecurity:    Worry: Not on file    Inability: Not on file  . Transportation needs:    Medical: Not on file    Non-medical: Not on file  Tobacco Use  . Smoking status: Never Smoker  . Smokeless tobacco: Never Used  Substance and Sexual Activity  . Alcohol use: No    Frequency: Never    Comment: 3 1/2 years sober  . Drug use: No  . Sexual activity: Never    Comment: both previously  Lifestyle  . Physical activity:    Days per week: Not on file    Minutes per session: Not on file  . Stress: Not on file  Relationships  . Social connections:    Talks on phone: Not on file    Gets together: Not on file    Attends religious service: Not on file    Active member of club or organization: Not on file    Attends meetings of clubs or organizations: Not on file    Relationship status: Not on file  .  Intimate partner violence:    Fear of current or ex partner: Not on file    Emotionally abused: Not on file    Physically abused: Not on file    Forced sexual activity: Not on file  Other Topics Concern  . Not on file  Social History Narrative  . Not on file      Current Outpatient Medications:  .  albuterol (PROVENTIL HFA;VENTOLIN HFA) 108 (90 Base) MCG/ACT inhaler, Inhale 2 puffs into the lungs every 6 (six) hours as needed for up to 30 days for wheezing or shortness of breath., Disp: 1 Inhaler, Rfl: 0 .  BABY ASPIRIN PO, Take 81 mg by mouth daily. , Disp: , Rfl:  .  Cholecalciferol (VITAMIN D3 PO), Take 1 tablet by mouth daily. , Disp: , Rfl:  .  clobetasol (OLUX) 0.05 % topical  foam, Apply topically 2 (two) times daily., Disp: 50 g, Rfl: 0 .  escitalopram (LEXAPRO) 20 MG tablet, TAKE 1 TABLET BY MOUTH EVERY DAY, Disp: 90 tablet, Rfl: 2 .  guaiFENesin (MUCINEX) 600 MG 12 hr tablet, Take 1 tablet (600 mg total) by mouth 2 (two) times daily., Disp: 24 tablet, Rfl: 0 .  ketoconazole (NIZORAL) 2 % shampoo, Apply 1 application topically 2 (two) times a week., Disp: , Rfl:  .  levothyroxine (SYNTHROID, LEVOTHROID) 100 MCG tablet, TAKE 1 AND 1/2 TABLET BY MOUTH MONDAY AND WEDNESDAY AND 1 TABLET ALL OTHER DAYS, Disp: 35 tablet, Rfl: 2 .  olopatadine (PATANOL) 0.1 % ophthalmic solution, Place 1 drop into both eyes 2 (two) times daily., Disp: 5 mL, Rfl: 1 .  simvastatin (ZOCOR) 20 MG tablet, TAKE 1 TABLET BY MOUTH EVERYDAY AT BEDTIME, Disp: 90 tablet, Rfl: 3  EXAM:  VITALS per patient if applicable:N/A  GENERAL: alert, oriented, appears well and in no acute distress  HEENT: atraumatic, normocephalic, conjunctiva clear, no eye purulent drainage,EOM intact. Mild edema lower eye lids, and no obvious facial abnormalities on inspection.  LUNGS: on inspection no signs of respiratory distress, breathing rate appears normal, no obvious gross SOB, gasping or wheezing  CV: no obvious cyanosis  PSYCH/NEURO: pleasant and cooperative, no obvious depression or anxiety, speech and thought processing grossly intact  ASSESSMENT AND PLAN:  Discussed the following assessment and plan:  Conjunctivitis of both eyes, unspecified conjunctivitis type - Plan: olopatadine (PATANOL) 0.1 % ophthalmic solution  We discussed possible etiologies. Provided history and inspection do not suggest a serious process. We will treat empirically as allergy conjunctivitis. Cold compresses may help with lower eyelids "puffiness." Clearly instructed about warning signs.    I discussed the assessment and treatment plan with the patient. She was provided an opportunity to ask questions and all were answered.  The patient agreed with the plan and demonstrated an understanding of the instructions.   The patient was advised to call back or seek an in-person evaluation if the symptoms worsen or if the condition fails to improve as anticipated.  Return if symptoms worsen or fail to improve.    Delayne Sanzo [place], MD

## 2018-12-10 ENCOUNTER — Encounter: Payer: Self-pay | Admitting: Family Medicine

## 2018-12-19 ENCOUNTER — Other Ambulatory Visit: Payer: Self-pay | Admitting: Family Medicine

## 2018-12-19 DIAGNOSIS — F325 Major depressive disorder, single episode, in full remission: Secondary | ICD-10-CM

## 2019-01-07 ENCOUNTER — Other Ambulatory Visit: Payer: Self-pay | Admitting: Family Medicine

## 2019-01-07 DIAGNOSIS — E039 Hypothyroidism, unspecified: Secondary | ICD-10-CM

## 2019-01-17 ENCOUNTER — Other Ambulatory Visit: Payer: Self-pay | Admitting: Family Medicine

## 2019-01-17 DIAGNOSIS — F325 Major depressive disorder, single episode, in full remission: Secondary | ICD-10-CM

## 2019-01-20 NOTE — Telephone Encounter (Signed)
Left message for patient to return call to office to schedule follow-up for medication refills.

## 2019-01-22 ENCOUNTER — Other Ambulatory Visit: Payer: Self-pay | Admitting: *Deleted

## 2019-01-22 DIAGNOSIS — F325 Major depressive disorder, single episode, in full remission: Secondary | ICD-10-CM

## 2019-01-22 MED ORDER — ESCITALOPRAM OXALATE 20 MG PO TABS: 20.0000 mg | ORAL_TABLET | Freq: Every day | ORAL | 0 refills | Status: DC

## 2019-01-22 NOTE — Telephone Encounter (Signed)
Patient called to schedule an appt as she is due for refills on Lexapro.  Appt scheduled for 7/28 as PCP is out of the office the week of 7/20.  Patient stated she only has a few pills left and is aware a 30-day supply was sent to [facility].

## 2019-02-03 ENCOUNTER — Other Ambulatory Visit: Payer: Self-pay

## 2019-02-03 ENCOUNTER — Ambulatory Visit (INDEPENDENT_AMBULATORY_CARE_PROVIDER_SITE_OTHER): Payer: 59 | Admitting: Family Medicine

## 2019-02-03 DIAGNOSIS — F1021 Alcohol dependence, in remission: Secondary | ICD-10-CM | POA: Diagnosis not present

## 2019-02-03 DIAGNOSIS — E785 Hyperlipidemia, unspecified: Secondary | ICD-10-CM

## 2019-02-03 DIAGNOSIS — E039 Hypothyroidism, unspecified: Secondary | ICD-10-CM | POA: Diagnosis not present

## 2019-02-03 DIAGNOSIS — F325 Major depressive disorder, single episode, in full remission: Secondary | ICD-10-CM

## 2019-02-03 MED ORDER — ESCITALOPRAM OXALATE 20 MG PO TABS: 20.0000 mg | ORAL_TABLET | Freq: Every day | ORAL | 3 refills | Status: DC

## 2019-02-03 NOTE — Assessment & Plan Note (Addendum)
She has been sober for 4 years. Continue AA meetings.

## 2019-02-03 NOTE — Progress Notes (Signed)
Virtual Visit via Video Note   I connected with Jessica Fry on 02/05/19 at  9:00 AM EDT by a video enabled telemedicine application and verified that I am speaking with the correct person using two identifiers.  Location patient: home Location provider:work or home office Persons participating in the virtual visit: patient, provider  I discussed the limitations of evaluation and management by telemedicine and the availability of in person appointments. The patient expressed understanding and agreed to proceed.   HPI: Jessica Fry is a 63 yo female who is following on some of her chronic medical problems today. Last seen for acute visit on 12/08/18. Last F/U 11/2017. No new problems since her last OV.  Hypothyroidism: She did not have TSH re-check.  Currently she is on Levothyroxine 100 mcg daily x 5 and 1.5 tab M-W Tolerating medication well, no side effects reported. She has not noted dysphagia, palpitations, abdominal pain, changes in bowel habits, tremor, cold/heat intolerance, or abnormal weight loss.  Lab Results  Component Value Date   TSH 6.77 (H) 11/19/2017   HLD,she is on Simvastatin 20 mg daily. Tolerating medication well. She follows low fat diet.  Lab Results  Component Value Date   CHOL 223 (H) 11/19/2017   HDL 60.00 11/19/2017   LDLCALC 125 (H) 11/19/2017   TRIG 190.0 (H) 11/19/2017   CHOLHDL 4 11/19/2017   Lab Results  Component Value Date   ALT 17 11/19/2017   AST 16 11/19/2017   ALKPHOS 63 11/19/2017   BILITOT 0.5 11/19/2017   Anxiety and depression,she is on Lexapro 20 mg daily. 8 years hx. Tolerating medication well. She lives alone. Attending church service through [place] and daily [employer] meetings. Hx of alcoholism,she has been sobered for a few years.  She is enjoying traveling locally, alone and following social distancing recommendations. In 09/2018 she was planning on travelling to [place] but it was cancelled due to [place] 19 pandemia.  Denies  depressed mood or suicidal thoughts.   ROS: See pertinent positives and negatives per HPI.  Past Medical History:  Diagnosis Date  . Alcohol addiction ([place])   . Allergy   . Chicken pox   . Depression   . Eczema   . GERD (gastroesophageal reflux disease)   . Hyperlipidemia   . Thyroid disease    Hypothyroid  . Urine incontinence     Past Surgical History:  Procedure Laterality Date  . nose-deviated septum injury  1972    Family History  Problem Relation Age of Onset  . Early death Mother   . Miscarriages / [place] Mother   . Alcohol abuse Father   . Depression Father   . Hearing loss Father   . Stroke Father   . Early death Brother   . Heart attack Maternal Grandmother   . Stroke Paternal Grandmother     Social History   Socioeconomic History  . Marital status: Divorced    Spouse name: Not on file  . Number of children: 2  . Years of education: Not on file  . Highest education level: Not on file  Occupational History  . Not on file  Social Needs  . Financial resource strain: Not on file  . Food insecurity    Worry: Not on file    Inability: Not on file  . Transportation needs    Medical: Not on file    Non-medical: Not on file  Tobacco Use  . Smoking status: Never Smoker  . Smokeless tobacco: Never Used  Substance  and Sexual Activity  . Alcohol use: No    Frequency: Never    Comment: 3 1/2 years sober  . Drug use: No  . Sexual activity: Never    Comment: both previously  Lifestyle  . Physical activity    Days per week: Not on file    Minutes per session: Not on file  . Stress: Not on file  Relationships  . Social Musicianconnections    Talks on phone: Not on file    Gets together: Not on file    Attends religious service: Not on file    Active member of club or organization: Not on file    Attends meetings of clubs or organizations: Not on file    Relationship status: Not on file  . Intimate partner violence    Fear of current or ex partner:  Not on file    Emotionally abused: Not on file    Physically abused: Not on file    Forced sexual activity: Not on file  Other Topics Concern  . Not on file  Social History Narrative  . Not on file      Current Outpatient Medications:  .  albuterol (PROVENTIL HFA;VENTOLIN HFA) 108 (90 Base) MCG/ACT inhaler, Inhale 2 puffs into the lungs every 6 (six) hours as needed for up to 30 days for wheezing or shortness of breath., Disp: 1 Inhaler, Rfl: 0 .  BABY ASPIRIN PO, Take 81 mg by mouth daily. , Disp: , Rfl:  .  Cholecalciferol (VITAMIN D3 PO), Take 1 tablet by mouth daily. , Disp: , Rfl:  .  clobetasol (OLUX) 0.05 % topical foam, Apply topically 2 (two) times daily., Disp: 50 g, Rfl: 0 .  escitalopram (LEXAPRO) 20 MG tablet, Take 1 tablet (20 mg total) by mouth daily., Disp: 90 tablet, Rfl: 3 .  guaiFENesin (MUCINEX) 600 MG 12 hr tablet, Take 1 tablet (600 mg total) by mouth 2 (two) times daily., Disp: 24 tablet, Rfl: 0 .  ketoconazole (NIZORAL) 2 % shampoo, Apply 1 application topically 2 (two) times a week., Disp: , Rfl:  .  levothyroxine (SYNTHROID) 100 MCG tablet, TAKE 1 AND 1/2 TABLET BY MOUTH MONDAY AND WEDNESDAY AND 1 TABLET BY MOUTH ALL OTHER DAYS, Disp: 35 tablet, Rfl: 2 .  olopatadine (PATANOL) 0.1 % ophthalmic solution, Place 1 drop into both eyes 2 (two) times daily., Disp: 5 mL, Rfl: 1 .  simvastatin (ZOCOR) 20 MG tablet, TAKE 1 TABLET BY MOUTH EVERYDAY AT BEDTIME, Disp: 90 tablet, Rfl: 3  EXAM:  VITALS per patient if applicable:N/A  GENERAL: alert, oriented, appears well and in no acute distress  HEENT: atraumatic, normocephalic,conjunctiva clear.  NECK: normal movements of the head and neck  LUNGS: on inspection no signs of respiratory distress, breathing rate appears normal, no obvious gross SOB, gasping or wheezing  CV: no obvious cyanosis  Jessica: moves all visible extremities without noticeable abnormality  PSYCH/NEURO: pleasant and cooperative, no obvious  depression or anxiety, speech and thought processing grossly intact  ASSESSMENT AND PLAN:  Discussed the following assessment and plan:  Orders Placed This Encounter  Procedures  . Comprehensive metabolic panel  . Lipid panel  . TSH     Primary hypothyroidism No changes in Levothyroxine dose. We will arrange lab appt and recommendations will be given according to TSH.  Depression, major, in remission (HCC) Problem still well controlled. Continue Lexapro 20 mg daily. As far as symptoms are controlled with medication we can continue following annually. Instructed about  warning signs.  Hyperlipidemia Continue Simvastatin 20 mg daily and low fat diet. Lab appt will be arranged.  History of alcohol dependence (HCC) She has been sober for 4 years. Continue AA meetings.  Lab appt will be arranged.  I discussed the assessment and treatment plan with the patient. She was provided an opportunity to ask questions and all were answered. She agreed with the plan and demonstrated an understanding of the instructions.  Return in about 1 year (around 02/03/2020) for cpe and f/u. Labs next week..    Betty [place], MD

## 2019-02-03 NOTE — Assessment & Plan Note (Signed)
Problem still well controlled. Continue Lexapro 20 mg daily. As far as symptoms are controlled with medication we can continue following annually. Instructed about warning signs.

## 2019-02-03 NOTE — Assessment & Plan Note (Signed)
Continue Simvastatin 20 mg daily and low fat diet. Lab appt will be arranged.

## 2019-02-03 NOTE — Assessment & Plan Note (Signed)
No changes in Levothyroxine dose. We will arrange lab appt and recommendations will be given according to TSH.

## 2019-02-05 ENCOUNTER — Encounter: Payer: Self-pay | Admitting: Family Medicine

## 2019-02-09 ENCOUNTER — Other Ambulatory Visit (INDEPENDENT_AMBULATORY_CARE_PROVIDER_SITE_OTHER): Payer: 59

## 2019-02-09 ENCOUNTER — Other Ambulatory Visit: Payer: Self-pay

## 2019-02-09 DIAGNOSIS — E039 Hypothyroidism, unspecified: Secondary | ICD-10-CM | POA: Diagnosis not present

## 2019-02-09 DIAGNOSIS — E785 Hyperlipidemia, unspecified: Secondary | ICD-10-CM | POA: Diagnosis not present

## 2019-02-09 LAB — COMPREHENSIVE METABOLIC PANEL
ALT: 12 U/L (ref 0–35)
AST: 15 U/L (ref 0–37)
Albumin: 3.9 g/dL (ref 3.5–5.2)
Alkaline Phosphatase: 67 U/L (ref 39–117)
BUN: 21 mg/dL (ref 6–23)
CO2: 23 mEq/L (ref 19–32)
Calcium: 9.4 mg/dL (ref 8.4–10.5)
Chloride: 106 mEq/L (ref 96–112)
Creatinine, Ser: 0.77 mg/dL (ref 0.40–1.20)
GFR: 75.33 mL/min (ref >60.00)
Glucose, Bld: 100 mg/dL — ABNORMAL HIGH (ref 70–99)
Potassium: 4 mEq/L (ref 3.5–5.1)
Sodium: 138 mEq/L (ref 135–145)
Total Bilirubin: 0.5 mg/dL (ref 0.2–1.2)
Total Protein: 6.8 g/dL (ref 6.0–8.3)

## 2019-02-09 LAB — LIPID PANEL
Cholesterol: 179 mg/dL (ref 0–200)
HDL: 52.2 mg/dL (ref >39.00)
LDL Cholesterol: 92 mg/dL (ref 0–99)
NonHDL: 126.66
Total CHOL/HDL Ratio: 3
Triglycerides: 171 mg/dL — ABNORMAL HIGH (ref 0.0–149.0)
VLDL: 34.2 mg/dL (ref 0.0–40.0)

## 2019-02-09 LAB — TSH: TSH: 1.42 u[IU]/mL (ref 0.35–4.50)

## 2019-02-16 ENCOUNTER — Encounter: Payer: Self-pay | Admitting: Family Medicine

## 2019-02-27 ENCOUNTER — Other Ambulatory Visit: Payer: Self-pay | Admitting: Family Medicine

## 2019-02-27 DIAGNOSIS — F325 Major depressive disorder, single episode, in full remission: Secondary | ICD-10-CM

## 2019-02-27 NOTE — Telephone Encounter (Signed)
Patient calling to check status of this refill. 

## 2019-02-27 NOTE — Telephone Encounter (Signed)
See request °

## 2019-03-20 ENCOUNTER — Encounter: Payer: Self-pay | Admitting: Family Medicine

## 2019-03-29 ENCOUNTER — Other Ambulatory Visit: Payer: Self-pay | Admitting: Family Medicine

## 2019-03-29 DIAGNOSIS — E039 Hypothyroidism, unspecified: Secondary | ICD-10-CM

## 2019-06-03 ENCOUNTER — Emergency Department (HOSPITAL_COMMUNITY): Payer: 59

## 2019-06-03 ENCOUNTER — Emergency Department (HOSPITAL_COMMUNITY)
Admission: EM | Admit: 2019-06-03 | Discharge: 2019-06-04 | Disposition: A | Discharge status: Home / Self Care | Payer: 59 | Attending: Emergency Medicine | Admitting: Emergency Medicine

## 2019-06-03 ENCOUNTER — Other Ambulatory Visit: Payer: Self-pay

## 2019-06-03 ENCOUNTER — Encounter (HOSPITAL_COMMUNITY): Payer: Self-pay

## 2019-06-03 DIAGNOSIS — R0789 Other chest pain: Secondary | ICD-10-CM

## 2019-06-03 DIAGNOSIS — Y929 Unspecified place or not applicable: Secondary | ICD-10-CM | POA: Insufficient documentation

## 2019-06-03 DIAGNOSIS — X58XXXA Exposure to other specified factors, initial encounter: Secondary | ICD-10-CM | POA: Insufficient documentation

## 2019-06-03 DIAGNOSIS — E039 Hypothyroidism, unspecified: Secondary | ICD-10-CM | POA: Diagnosis not present

## 2019-06-03 DIAGNOSIS — Z79899 Other long term (current) drug therapy: Secondary | ICD-10-CM | POA: Insufficient documentation

## 2019-06-03 DIAGNOSIS — Y999 Unspecified external cause status: Secondary | ICD-10-CM | POA: Diagnosis not present

## 2019-06-03 DIAGNOSIS — S299XXA Unspecified injury of thorax, initial encounter: Secondary | ICD-10-CM | POA: Diagnosis present

## 2019-06-03 DIAGNOSIS — Z7982 Long term (current) use of aspirin: Secondary | ICD-10-CM | POA: Insufficient documentation

## 2019-06-03 DIAGNOSIS — Y939 Activity, unspecified: Secondary | ICD-10-CM | POA: Insufficient documentation

## 2019-06-03 DIAGNOSIS — T148XXA Other injury of unspecified body region, initial encounter: Secondary | ICD-10-CM

## 2019-06-03 DIAGNOSIS — S29012A Strain of muscle and tendon of back wall of thorax, initial encounter: Secondary | ICD-10-CM | POA: Insufficient documentation

## 2019-06-03 LAB — CBC
HCT: 42.2 % (ref 36.0–46.0)
Hemoglobin: 14.1 g/dL (ref 12.0–15.0)
MCH: 28.7 pg (ref 26.0–34.0)
MCHC: 33.4 g/dL (ref 30.0–36.0)
MCV: 85.8 fL (ref 80.0–100.0)
Platelets: 322 10*3/uL (ref 150–400)
RBC: 4.92 MIL/uL (ref 3.87–5.11)
RDW: 13 % (ref 11.5–15.5)
WBC: 8.7 10*3/uL (ref 4.0–10.5)
nRBC: 0 % (ref 0.0–0.2)

## 2019-06-03 LAB — BASIC METABOLIC PANEL
Anion gap: 12 (ref 5–15)
BUN: 17 mg/dL (ref 8–23)
CO2: 21 mmol/L — ABNORMAL LOW (ref 22–32)
Calcium: 9.5 mg/dL (ref 8.9–10.3)
Chloride: 106 mmol/L (ref 98–111)
Creatinine, Ser: 0.74 mg/dL (ref 0.44–1.00)
GFR calc Af Amer: >60 mL/min (ref >60)
GFR calc non Af Amer: >60 mL/min (ref >60)
Glucose, Bld: 98 mg/dL (ref 70–99)
Potassium: 4 mmol/L (ref 3.5–5.1)
Sodium: 139 mmol/L (ref 135–145)

## 2019-06-03 LAB — TROPONIN I (HIGH SENSITIVITY)
Troponin I (High Sensitivity): 3 ng/L (ref <18)
Troponin I (High Sensitivity): 4 ng/L (ref <18)

## 2019-06-03 MED ORDER — SODIUM CHLORIDE 0.9% FLUSH: 3.0000 mL | Freq: Once | INTRAVENOUS | Status: DC

## 2019-06-03 NOTE — ED Triage Notes (Signed)
Pt from home with ems for upper back and left shoulder pain along with intermittent chest wall pain. Pain changes with movement and palpation. Pt a.o   BP 144/70 HR 56 [id number]

## 2019-06-04 MED ORDER — KETOROLAC TROMETHAMINE 60 MG/2ML IM SOLN
30.0000 mg | Freq: Once | INTRAMUSCULAR | Status: AC
  Administered 2019-06-04: 30 mg via INTRAMUSCULAR
  Filled 2019-06-04: qty 2

## 2019-06-04 NOTE — Discharge Instructions (Addendum)
You may use over-the-counter Motrin (Ibuprofen), Acetaminophen (Tylenol), topical muscle creams such as SalonPas, Icy Hot, Bengay, etc. Please stretch, apply heat, and have massage therapy for additional assistance. ° °

## 2019-06-04 NOTE — ED Provider Notes (Signed)
[facility] EMERGENCY DEPARTMENT Provider Note  CSN: [id number] Arrival date & time: 06/03/19 1820  Chief Complaint(s) Back Pain and Shoulder Pain  HPI Jessica Fry is a 63 y.o. female who presents to the emergency department with 3 days of left-sided chest wall pain described as a burning sensation worse with movement and palpation of the area.  Alleviated by Motrin.  No associated shortness of breath.  No substernal chest pain.  No nausea or vomiting.  No abdominal pain.  No recent fevers or infections.  Denies any trauma or falls.  No other physical complaints.  HPI  Past Medical History Past Medical History:  Diagnosis Date   Alcohol addiction (HCC)    Allergy    Chicken pox    Depression    Eczema    GERD (gastroesophageal reflux disease)    Hyperlipidemia    Thyroid disease    Hypothyroid   Urine incontinence    Patient Active Problem List   Diagnosis Date Noted   History of alcohol dependence (HCC) 02/03/2019   Hyperlipidemia 08/26/2017   Depression, major, in remission (HCC) 08/26/2017   Primary hypothyroidism 08/26/2017   Psoriasis 08/26/2017   Home Medication(s) Prior to Admission medications   Medication Sig Start Date End Date Taking? Authorizing Provider  albuterol (PROVENTIL HFA;VENTOLIN HFA) 108 (90 Base) MCG/ACT inhaler Inhale 2 puffs into the lungs every 6 (six) hours as needed for up to 30 days for wheezing or shortness of breath. 09/26/18 10/26/18  [place], Betty G, MD  BABY ASPIRIN PO Take 81 mg by mouth daily.     [provider]  Cholecalciferol (VITAMIN D3 PO) Take 1 tablet by mouth daily.     [provider]  clobetasol (OLUX) 0.05 % topical foam Apply topically 2 (two) times daily. 08/26/17   [place], Betty G, MD  escitalopram (LEXAPRO) 20 MG tablet TAKE 1 TABLET BY MOUTH EVERY DAY 03/02/19   [place], Betty G, MD  guaiFENesin (MUCINEX) 600 MG 12 hr tablet Take 1 tablet (600 mg total) by mouth 2  (two) times daily. 08/21/18   Molpus, John, MD  ketoconazole (NIZORAL) 2 % shampoo Apply 1 application topically 2 (two) times a week.    [provider]  levothyroxine (SYNTHROID) 100 MCG tablet TAKE 1 AND 1/2 TABLET BY MOUTH MONDAY AND WEDNESDAY AND 1 TABLET BY MOUTH ALL OTHER DAYS 03/30/19   [place], Betty G, MD  olopatadine (PATANOL) 0.1 % ophthalmic solution Place 1 drop into both eyes 2 (two) times daily. 12/08/18   [place], Betty G, MD  simvastatin (ZOCOR) 20 MG tablet TAKE 1 TABLET BY MOUTH EVERYDAY AT BEDTIME 12/08/18   [place], Betty G, MD                                                                                                                                    Past Surgical History Past Surgical History:  Procedure Laterality  Date   nose-deviated septum injury  98   Family History Family History  Problem Relation Age of Onset   Early death Mother    52 / [place] Mother    Alcohol abuse Father    Depression Father    Hearing loss Father    Stroke Father    Early death Brother    Heart attack Maternal Grandmother    Stroke Paternal Grandmother     Social History Social History   Tobacco Use   Smoking status: Never Smoker   Smokeless tobacco: Never Used  Substance Use Topics   Alcohol use: No    Frequency: Never    Comment: 3 1/2 years sober   Drug use: No   Allergies Ampicillin and Penicillins  Review of Systems Review of Systems All other systems are reviewed and are negative for acute change except as noted in the HPI  Physical Exam Vital Signs  I have reviewed the triage vital signs BP (!) 152/64    Pulse (!) 54    Temp 98.2 F (36.8 C) (Oral)    Resp 18    Ht 5\' 6"  (1.676 m)    Wt 96.2 kg    SpO2 96%    BMI 34.23 kg/m   Physical Exam Vitals signs reviewed.  Constitutional:      General: She is not in acute distress.    Appearance: She is well-developed. She is not diaphoretic.  HENT:     Head: Normocephalic and  atraumatic.     Nose: Nose normal.  Eyes:     General: No scleral icterus.       Right eye: No discharge.        Left eye: No discharge.     Conjunctiva/sclera: Conjunctivae normal.     Pupils: Pupils are equal, round, and reactive to light.  Neck:     Musculoskeletal: Normal range of motion and neck supple.  Cardiovascular:     Rate and Rhythm: Normal rate and regular rhythm.     Heart sounds: No murmur. No friction rub. No gallop.   Pulmonary:     Effort: Pulmonary effort is normal. No respiratory distress.     Breath sounds: Normal breath sounds. No stridor. No rales.    Chest:     Chest wall: Tenderness present.  Abdominal:     General: There is no distension.     Palpations: Abdomen is soft.     Tenderness: There is no abdominal tenderness.  Musculoskeletal:        General: No tenderness.  Skin:    General: Skin is warm and dry.     Findings: No erythema or rash.  Neurological:     Mental Status: She is alert and oriented to person, place, and time.     ED Results and Treatments Labs (all labs ordered are listed, but only abnormal results are displayed) Labs Reviewed  BASIC METABOLIC PANEL - Abnormal; Notable for the following components:      Result Value   CO2 21 (*)    All other components within normal limits  CBC  TROPONIN I (HIGH SENSITIVITY)  TROPONIN I (HIGH SENSITIVITY)  EKG  EKG Interpretation  Date/Time:  Wednesday June 03 2019 18:24:37 EST Ventricular Rate:  59 PR Interval:  166 QRS Duration: 74 QT Interval:  426 QTC Calculation: 421 R Axis:   7 Text Interpretation: Sinus bradycardia Low voltage QRS motion artifact. Abnormal ECG NO STEMI. Confirmed by Drema Pryardama, Keala Drum [phone]([id number]) on 06/04/2019 2:28:26 AM      Radiology Dg Chest 2 View  Result Date: 06/03/2019 CLINICAL DATA:  Upper back and left shoulder pain with  intermittent chest pain EXAM: CHEST - 2 VIEW COMPARISON:  08/21/2018 FINDINGS: The heart size and mediastinal contours are within normal limits. Both lungs are clear. The visualized skeletal structures are unremarkable. IMPRESSION: No active cardiopulmonary disease. Electronically Signed   By: Gaylyn RongWalter  Liebkemann M.D.   On: 06/03/2019 18:50    Pertinent labs & imaging results that were available during my care of the patient were reviewed by me and considered in my medical decision making (see chart for details).  Medications Ordered in ED Medications  sodium chloride flush (NS) 0.9 % injection 3 mL (3 mLs Intravenous Not Given 06/04/19 0327)  ketorolac (TORADOL) injection 30 mg (30 mg Intramuscular Given 06/04/19 0327)                                                                                                                                    Procedures Procedures  (including critical care time)  Medical Decision Making / ED Course I have reviewed the nursing notes for this encounter and the patient's prior records (if available in EHR or on provided paperwork).   Jessica Fry was evaluated in Emergency Department on 06/04/2019 for the symptoms described in the history of present illness. She was evaluated in the context of the global COVID-19 pandemic, which necessitated consideration that the patient might be at risk for infection with the SARS-CoV-2 virus that causes COVID-19. Institutional protocols and algorithms that pertain to the evaluation of patients at risk for COVID-19 are in a state of rapid change based on information released by regulatory bodies including the CDC and federal and state organizations. These policies and algorithms were followed during the patient's care in the ED.  Atypical chest pain highly consistent with ACS.  Most suspicious for muscle strain/spasm versus early shingles neuralgia.   EKG and serial troponins drawn in triage were reassuring.   Ruling out ACS.  Low suspicion for pulmonary embolism.  Presentation not classic for aortic dissection or esophageal perforation.  Chest x-ray without evidence suggestive of pneumonia, pneumothorax, pneumomediastinum.  No abnormal contour of the mediastinum to suggest dissection. No evidence of acute injuries.  Discussed differential with patient and instructed her to treated as muscle strain/spasm to keep an eye out for developing rash which would confirm shingles.  The patient appears reasonably screened and/or stabilized for discharge and I doubt any other medical condition or other [facility] requiring further screening, evaluation, or treatment in the ED at  this time prior to discharge.  The patient is safe for discharge with strict return precautions.       Final Clinical Impression(s) / ED Diagnoses Final diagnoses:  Muscle strain  Chest wall pain    The patient appears reasonably screened and/or stabilized for discharge and I doubt any other medical condition or other [facility] requiring further screening, evaluation, or treatment in the ED at this time prior to discharge.  Disposition: Discharge  Condition: Good  I have discussed the results, Dx and Tx plan with the patient who expressed understanding and agree(s) with the plan. Discharge instructions discussed at great length. The patient was given strict return precautions who verbalized understanding of the instructions. No further questions at time of discharge.    ED Discharge Orders    None        Follow Up: [place], Betty G, MD [address] [place] [address] [phone]  Schedule an appointment as soon as possible for a visit  If symptoms do not improve or  worsen      This chart was dictated using voice recognition software.  Despite best efforts to proofread,  errors can occur which can change the documentation meaning.   Nira Conn, MD 06/04/19 [phone]

## 2019-06-23 ENCOUNTER — Other Ambulatory Visit: Payer: Self-pay | Admitting: Family Medicine

## 2019-06-23 DIAGNOSIS — E039 Hypothyroidism, unspecified: Secondary | ICD-10-CM

## 2019-08-03 ENCOUNTER — Ambulatory Visit (INDEPENDENT_AMBULATORY_CARE_PROVIDER_SITE_OTHER)
Admission: RE | Admit: 2019-08-03 | Discharge: 2019-08-03 | Disposition: A | Discharge status: Home / Self Care | Payer: Medicare HMO | Source: Ambulatory Visit

## 2019-08-03 DIAGNOSIS — J0100 Acute maxillary sinusitis, unspecified: Secondary | ICD-10-CM

## 2019-08-03 DIAGNOSIS — J029 Acute pharyngitis, unspecified: Secondary | ICD-10-CM | POA: Diagnosis not present

## 2019-08-03 MED ORDER — AZITHROMYCIN 250 MG PO TABS: 250.0000 mg | ORAL_TABLET | Freq: Every day | ORAL | 0 refills | Status: DC

## 2019-08-03 NOTE — ED Provider Notes (Signed)
Virtual Visit via Video Note:  Jessica Fry  initiated request for Telemedicine visit with [facility] Urgent Care team. I connected with Jessica Fry  on 08/03/2019 at 12:59 PM  for a synchronized telemedicine visit using a video enabled HIPPA compliant telemedicine application. I verified that I am speaking with Jessica Fry  using two identifiers. Mickie Bail, NP  was physically located in a [facility] Urgent care site and Jessica Fry was located at a different location.   The limitations of evaluation and management by telemedicine as well as the availability of in-person appointments were discussed. Patient was informed that she  may incur a bill ( including co-pay) for this virtual visit encounter. Jessica Fry  expressed understanding and gave verbal consent to proceed with virtual visit.     History of Present Illness:Jessica Fry  is a 64 y.o. female presents for evaluation of >1 week history of sore throat and swollen lymph nodes in her neck.  She also reports hoarse voice, sinus congestion, postnasal drip, and feeling warm.  Tmax 99.1.  She denies difficulty swallowing or breathing.  She denies cough, shortness of breath, vomiting, diarrhea, rash or other symptoms.  She has attempted treatment at home with Mucinex and ibuprofen.   Allergies  Allergen Reactions  . Ampicillin Hives  . Penicillins Hives    Did it involve swelling of the face/tongue/throat, SOB, or low BP? No Did it involve sudden or severe rash/hives, skin peeling, or any reaction on the inside of your mouth or nose? Yes Did you need to seek medical attention at a hospital or doctor's office? No When did it last happen?unk If all above answers are "NO", may proceed with cephalosporin use.      Past Medical History:  Diagnosis Date  . Alcohol addiction (HCC)   . Allergy   . Chicken pox   . Depression   . Eczema   . GERD (gastroesophageal reflux  disease)   . Hyperlipidemia   . Thyroid disease    Hypothyroid  . Urine incontinence      Social History   Tobacco Use  . Smoking status: Never Smoker  . Smokeless tobacco: Never Used  Substance Use Topics  . Alcohol use: No    Comment: 3 1/2 years sober  . Drug use: No        Observations/Objective: Physical Exam  VITALS: Patient denies fever. GENERAL: Alert, appears well and in no acute distress. HEENT: Atraumatic. NECK: Normal movements of the head and neck. CARDIOPULMONARY: No increased WOB. Speaking in clear sentences. I:E ratio WNL.  MS: Moves all visible extremities without noticeable abnormality. PSYCH: Pleasant and cooperative, well-groomed. Speech normal rate and rhythm. Affect is appropriate. Insight and judgement are appropriate. Attention is focused, linear, and appropriate.  NEURO: CN grossly intact. Oriented as arrived to appointment on time with no prompting. Moves both UE equally.  SKIN: No obvious lesions, wounds, erythema, or cyanosis noted on face or hands.   Assessment and Plan:    ICD-10-CM   1. Sore throat  J02.9   2. Acute non-recurrent maxillary sinusitis  J01.00        Follow Up Instructions: Treating with Zithromax (patient is allergic to penicillins).  Instructed her to continue taking the ibuprofen and Mucinex as needed.  Instructed her to follow-up with her PCP or come here to be seen in person if her symptoms are not improving.  Patient agrees to plan of care.  I discussed the assessment and treatment plan with the patient. The patient was provided an opportunity to ask questions and all were answered. The patient agreed with the plan and demonstrated an understanding of the instructions.   The patient was advised to call back or seek an in-person evaluation if the symptoms worsen or if the condition fails to improve as anticipated.      Sharion Balloon, NP  08/03/2019 12:59 PM         Sharion Balloon, NP 08/03/19 1301

## 2019-08-07 ENCOUNTER — Ambulatory Visit: Payer: 59

## 2019-08-09 ENCOUNTER — Telehealth: Payer: Medicare HMO

## 2019-08-12 ENCOUNTER — Encounter: Payer: Self-pay | Admitting: Family Medicine

## 2019-08-12 ENCOUNTER — Telehealth (INDEPENDENT_AMBULATORY_CARE_PROVIDER_SITE_OTHER): Payer: Medicare HMO | Admitting: Family Medicine

## 2019-08-12 VITALS — Ht 66.0 in

## 2019-08-12 DIAGNOSIS — L282 Other prurigo: Secondary | ICD-10-CM | POA: Diagnosis not present

## 2019-08-12 DIAGNOSIS — J029 Acute pharyngitis, unspecified: Secondary | ICD-10-CM

## 2019-08-12 DIAGNOSIS — M542 Cervicalgia: Secondary | ICD-10-CM | POA: Diagnosis not present

## 2019-08-12 LAB — POCT RAPID STREP A (OFFICE): Rapid Strep A Screen: NEGATIVE

## 2019-08-12 NOTE — Progress Notes (Signed)
Virtual Visit via Video Note   I connected with Jessica Fry on 08/12/19 by a video enabled telemedicine application and verified that I am speaking with the correct person using two identifiers.  Location patient: home Location provider:work office Persons participating in the virtual visit: patient, provider  I discussed the limitations of evaluation and management by telemedicine and the availability of in person appointments. The patient expressed understanding and agreed to proceed.   HPI: Jessica Fry is a 64 yo female with hx of eczema,allergies,and GERD c/o weeks of left-sided neck pain Pain is exacerbated by movement.  and swollen glands, fatigue, and decreased appetite.  She had a virtual ER visit, she was treated empirically with azithromycin, which seemed to help for the first 2 days. Occasionally she has noticed some white spots in oral mucosa. Soft palate "bright red." She is no longer having sore throat but thinks she may have strep throat.  Negative for dysphagia or stridor. Negative for fever, chills, nasal congestion, rhinorrhea, facial pain, earache/fullness, cough, wheezing, CP, dyspnea, abdominal pain, N/V, or body aches. Copious postnasal drainage, greenish sometimes. Problem seems to be worse in the morning. Discomfort is alleviated by clearing of her throat.  She has history of allergies but "not too much."  -4 days ago she noted pruritic rash on right lower extremity, above lateral malleolus.  Also 99.0 temp at night x1. Negative for night sweats or abnormal weight loss.  He seems to be getting better now. She has not used OTC medication.  ROS: See pertinent positives and negatives per HPI.  Past Medical History:  Diagnosis Date  . Alcohol addiction (HCC)   . Allergy   . Chicken pox   . Depression   . Eczema   . GERD (gastroesophageal reflux disease)   . Hyperlipidemia   . Thyroid disease    Hypothyroid  . Urine incontinence     Past Surgical  History:  Procedure Laterality Date  . nose-deviated septum injury  1972    Family History  Problem Relation Age of Onset  . Early death Mother   . Miscarriages / [place] Mother   . Alcohol abuse Father   . Depression Father   . Hearing loss Father   . Stroke Father   . Early death Brother   . Heart attack Maternal Grandmother   . Stroke Paternal Grandmother     Social History   Socioeconomic History  . Marital status: Divorced    Spouse name: Not on file  . Number of children: 2  . Years of education: Not on file  . Highest education level: Not on file  Occupational History  . Not on file  Tobacco Use  . Smoking status: Never Smoker  . Smokeless tobacco: Never Used  Substance and Sexual Activity  . Alcohol use: No    Comment: 3 1/2 years sober  . Drug use: No  . Sexual activity: Never    Comment: both previously  Other Topics Concern  . Not on file  Social History Narrative  . Not on file   Social Determinants of Health   Financial Resource Strain:   . Difficulty of Paying Living Expenses: Not on file  Food Insecurity:   . Worried About Programme researcher, broadcasting/film/video in the Last Year: Not on file  . Ran Out of Food in the Last Year: Not on file  Transportation Needs:   . Lack of Transportation (Medical): Not on file  . Lack of Transportation (Non-Medical): Not on file  Physical Activity:   . Days of Exercise per Week: Not on file  . Minutes of Exercise per Session: Not on file  Stress:   . Feeling of Stress : Not on file  Social Connections:   . Frequency of Communication with Friends and Family: Not on file  . Frequency of Social Gatherings with Friends and Family: Not on file  . Attends Religious Services: Not on file  . Active Member of Clubs or Organizations: Not on file  . Attends Banker Meetings: Not on file  . Marital Status: Not on file  Intimate Partner Violence:   . Fear of Current or Ex-Partner: Not on file  . Emotionally Abused:  Not on file  . Physically Abused: Not on file  . Sexually Abused: Not on file    Current Outpatient Medications:  .  BABY ASPIRIN PO, Take 81 mg by mouth daily. , Disp: , Rfl:  .  Cholecalciferol (VITAMIN D3 PO), Take 1 tablet by mouth daily. , Disp: , Rfl:  .  escitalopram (LEXAPRO) 20 MG tablet, TAKE 1 TABLET BY MOUTH EVERY DAY, Disp: 90 tablet, Rfl: 3 .  levothyroxine (SYNTHROID) 100 MCG tablet, TAKE 1 AND 1/2 TABLET BY MOUTH MONDAY AND WEDNESDAY AND 1 TABLET BY MOUTH ALL OTHER DAYS, Disp: 35 tablet, Rfl: 2 .  simvastatin (ZOCOR) 20 MG tablet, TAKE 1 TABLET BY MOUTH EVERYDAY AT BEDTIME, Disp: 90 tablet, Rfl: 3 .  albuterol (PROVENTIL HFA;VENTOLIN HFA) 108 (90 Base) MCG/ACT inhaler, Inhale 2 puffs into the lungs every 6 (six) hours as needed for up to 30 days for wheezing or shortness of breath., Disp: 1 Inhaler, Rfl: 0 .  triamcinolone cream (KENALOG) 0.1 %, Apply 1 application topically 2 (two) times daily., Disp: 30 g, Rfl: 0  EXAM:  VITALS per patient if applicable:Ht 5\' 6"  (1.676 m)   BMI 34.23 kg/m   GENERAL: alert, oriented, appears well and in no acute distress  HEENT: atraumatic, conjunctiva clear, no obvious abnormalities on inspection of external nose and ears Mild erythema and no exudate.  I took oropharyngeal sample in the parking lot. No oropharyngeal erythema or exudate. + Post nasal drainage. No cervical adenopathies and normal submandibular glands.  NECK: normal movements of the head and neck  LUNGS: on inspection no signs of respiratory distress, breathing rate appears normal, no obvious gross SOB, gasping or wheezing  CV: no obvious cyanosis  Jessica: moves all visible extremities without noticeable abnormality  SKIN: Micropapular confluent rash distal RLE, above lateral malleolus. Some with brown crusts. See picture.   PSYCH/NEURO: pleasant and cooperative, no obvious depression or anxiety, speech and thought processing grossly intact      ASSESSMENT AND  PLAN:  Discussed the following assessment and plan:  Sore throat - Plan: POC Rapid Strep A, Culture, Group A Strep Problem improved. Findings do not suggest strep infection. ? Allergic etiology.  Rapid strep test negative. We will follow Cx.  Neck pain ? Musculoskeletal. ROM exercises may help. I do not think imaging is needed at this time.  Pruritic rash ? Eczema. ? Insect bites. Improving. Topical steroid , small amount on affected area bid x 14 days recommended. F/U as needed.  - triamcinolone cream (KENALOG) 0.1 %; Apply 1 application topically 2 (two) times daily.  Dispense: 30 g; Refill: 0   I discussed the assessment and treatment plan with the patient. Jessica Fry was provided an opportunity to ask questions and all were answered. She agreed with the plan and  demonstrated an understanding of the instructions.    Return if symptoms worsen or fail to improve.    Philomina Leon [place], MD

## 2019-08-13 ENCOUNTER — Encounter: Payer: Self-pay | Admitting: Family Medicine

## 2019-08-13 MED ORDER — TRIAMCINOLONE ACETONIDE 0.1 % EX CREA: 1.0000 "application " | TOPICAL_CREAM | Freq: Two times a day (BID) | CUTANEOUS | 0 refills | Status: DC

## 2019-08-14 ENCOUNTER — Encounter: Payer: Self-pay | Admitting: Family Medicine

## 2019-08-14 LAB — CULTURE, GROUP A STREP
MICRO NUMBER:: 10111810
SPECIMEN QUALITY:: ADEQUATE

## 2019-08-15 ENCOUNTER — Ambulatory Visit: Payer: Medicare HMO | Attending: Internal Medicine

## 2019-08-15 DIAGNOSIS — Z23 Encounter for immunization: Secondary | ICD-10-CM | POA: Insufficient documentation

## 2019-08-15 NOTE — Progress Notes (Signed)
   Covid-19 Vaccination Clinic  Name:  Jessica Fry    MRN: [id number] DOB: Mar 12, 1956  08/15/2019  Jessica Fry was observed post Covid-19 immunization for 15 minutes without incidence. She was provided with Vaccine Information Sheet and instruction to access the V-Safe system.   Jessica Fry was instructed to call 911 with any severe reactions post vaccine: Marland Kitchen Difficulty breathing  . Swelling of your face and throat  . A fast heartbeat  . A bad rash all over your body  . Dizziness and weakness    Immunizations Administered    Name Date Dose VIS Date Route   Pfizer COVID-19 Vaccine 08/15/2019  4:16 PM 0.3 mL 06/19/2019 Intramuscular   Manufacturer: [employer], [employer]   Lot: [id number]   NDC: [address]-6

## 2019-08-24 ENCOUNTER — Ambulatory Visit: Payer: 59

## 2019-09-09 ENCOUNTER — Ambulatory Visit: Payer: Medicare HMO | Attending: Internal Medicine

## 2019-09-09 DIAGNOSIS — Z23 Encounter for immunization: Secondary | ICD-10-CM | POA: Insufficient documentation

## 2019-09-09 NOTE — Progress Notes (Signed)
   Covid-19 Vaccination Clinic  Name:  Jessica Fry    MRN: [id number] DOB: Jun 28, 1956  09/09/2019  [name] was observed post Covid-19 immunization for 15 minutes without incident. She was provided with Vaccine Information Sheet and instruction to access the V-Safe system.   [name] was instructed to call 911 with any severe reactions post vaccine: Marland Kitchen Difficulty breathing  . Swelling of face and throat  . A fast heartbeat  . A bad rash all over body  . Dizziness and weakness   Immunizations Administered    Name Date Dose VIS Date Route   Pfizer COVID-19 Vaccine 09/09/2019 12:19 PM 0.3 mL 06/19/2019 Intramuscular   Manufacturer: [employer], [employer]   Lot: [id number]   NDC: [address]-8

## 2019-09-17 ENCOUNTER — Other Ambulatory Visit: Payer: Self-pay | Admitting: Family Medicine

## 2019-09-17 DIAGNOSIS — E039 Hypothyroidism, unspecified: Secondary | ICD-10-CM

## 2019-10-26 ENCOUNTER — Telehealth (INDEPENDENT_AMBULATORY_CARE_PROVIDER_SITE_OTHER): Payer: Medicare HMO | Admitting: Family Medicine

## 2019-10-26 ENCOUNTER — Encounter: Payer: Self-pay | Admitting: Family Medicine

## 2019-10-26 DIAGNOSIS — J301 Allergic rhinitis due to pollen: Secondary | ICD-10-CM

## 2019-10-26 DIAGNOSIS — J309 Allergic rhinitis, unspecified: Secondary | ICD-10-CM | POA: Insufficient documentation

## 2019-10-26 MED ORDER — FLUTICASONE PROPIONATE 50 MCG/ACT NA SUSP: 1.0000 | Freq: Two times a day (BID) | NASAL | 3 refills | Status: DC

## 2019-10-26 MED ORDER — MONTELUKAST SODIUM 10 MG PO TABS: 10.0000 mg | ORAL_TABLET | Freq: Every day | ORAL | 3 refills | Status: DC

## 2019-10-26 NOTE — Progress Notes (Signed)
Virtual Visit via Video Note   I connected with Jessica Fry on 10/26/19 by a video enabled telemedicine application and verified that I am speaking with the correct person using two identifiers.  Location patient: home Location provider:work office Persons participating in the virtual visit: patient, provider  I discussed the limitations of evaluation and management by telemedicine and the availability of in person appointments. The patient expressed understanding and agreed to proceed.  Chief Complaint  Patient presents with  . Allergies    stated 1 week and half ago, would like some medication for it    HPI: Jessica Fry is a 64 yo female with above complaint. Hx of allergies. She recently moved to a anew place and exposed to dust. She also has trees around her apartment and thinks this has aggravated problem. Eye pruritus, epiphora, nasal congestion, nose pruritus,postnasal drainage, sneezing, occasional nonproductive cough. She has not identified alleviating factors.  Negative for fever, chills, unusual fatigue, changes in appetite, sore throat, wheezing, dyspnea, abdominal pain, nausea, vomiting, or a skin rash. No anosmia or ageustia.  She has taking OTC loratadine, started about 3 to 4 days ago, it does not seem to help much, sometimes she takes 2 tablets daily, which causes drowsiness.  Problem is stable.  ROS: See pertinent positives and negatives per HPI.  Past Medical History:  Diagnosis Date  . Alcohol addiction (HCC)   . Allergy   . Chicken pox   . Depression   . Eczema   . GERD (gastroesophageal reflux disease)   . Hyperlipidemia   . Thyroid disease    Hypothyroid  . Urine incontinence     Past Surgical History:  Procedure Laterality Date  . nose-deviated septum injury  1972    Family History  Problem Relation Age of Onset  . Early death Mother   . Miscarriages / [place] Mother   . Alcohol abuse Father   . Depression Father   . Hearing loss  Father   . Stroke Father   . Early death Brother   . Heart attack Maternal Grandmother   . Stroke Paternal Grandmother     Social History   Socioeconomic History  . Marital status: Divorced    Spouse name: Not on file  . Number of children: 2  . Years of education: Not on file  . Highest education level: Not on file  Occupational History  . Not on file  Tobacco Use  . Smoking status: Never Smoker  . Smokeless tobacco: Never Used  Substance and Sexual Activity  . Alcohol use: No    Comment: 3 1/2 years sober  . Drug use: No  . Sexual activity: Never    Comment: both previously  Other Topics Concern  . Not on file  Social History Narrative  . Not on file   Social Determinants of Health   Financial Resource Strain:   . Difficulty of Paying Living Expenses:   Food Insecurity:   . Worried About Programme researcher, broadcasting/film/video in the Last Year:   . Barista in the Last Year:   Transportation Needs:   . Freight forwarder (Medical):   Marland Kitchen Lack of Transportation (Non-Medical):   Physical Activity:   . Days of Exercise per Week:   . Minutes of Exercise per Session:   Stress:   . Feeling of Stress :   Social Connections:   . Frequency of Communication with Friends and Family:   . Frequency of Social Gatherings with Friends  and Family:   . Attends Religious Services:   . Active Member of Clubs or Organizations:   . Attends Archivist Meetings:   Marland Kitchen Marital Status:   Intimate Partner Violence:   . Fear of Current or Ex-Partner:   . Emotionally Abused:   Marland Kitchen Physically Abused:   . Sexually Abused:     Current Outpatient Medications:  .  BABY ASPIRIN PO, Take 81 mg by mouth daily. , Disp: , Rfl:  .  Cholecalciferol (VITAMIN D3 PO), Take 1 tablet by mouth daily. , Disp: , Rfl:  .  escitalopram (LEXAPRO) 20 MG tablet, TAKE 1 TABLET BY MOUTH EVERY DAY, Disp: 90 tablet, Rfl: 3 .  levothyroxine (SYNTHROID) 100 MCG tablet, TAKE 1 AND 1/2 TABLETS BY MOUTH MONDAY AND  WEDNESDAY AND 1 TABLET BY MOUTH ALL OTHER DAYS, Disp: 35 tablet, Rfl: 2 .  simvastatin (ZOCOR) 20 MG tablet, TAKE 1 TABLET BY MOUTH EVERYDAY AT BEDTIME, Disp: 90 tablet, Rfl: 3 .  triamcinolone cream (KENALOG) 0.1 %, Apply 1 application topically 2 (two) times daily., Disp: 30 g, Rfl: 0 .  fluticasone (FLONASE) 50 MCG/ACT nasal spray, Place 1 spray into both nostrils 2 (two) times daily., Disp: 16 g, Rfl: 3 .  montelukast (SINGULAIR) 10 MG tablet, Take 1 tablet (10 mg total) by mouth at bedtime., Disp: 30 tablet, Rfl: 3  EXAM:  VITALS per patient if applicable:Ht 5\' 6"  (1.676 m)   BMI 34.23 kg/m   GENERAL: alert, oriented, appears well and in no acute distress  HEENT: atraumatic, conjunctiva clear, no obvious abnormalities on inspection of external nose and ears  LUNGS: on inspection no signs of respiratory distress, breathing rate appears normal, no obvious gross SOB, gasping or wheezing. Non productive cough a couple times.  CV: no obvious cyanosis  PSYCH/NEURO: pleasant and cooperative, no obvious depression or anxiety, speech and thought processing grossly intact  ASSESSMENT AND PLAN:  Discussed the following assessment and plan:  Seasonal allergic rhinitis due to pollen  Recommend changing to Zyrtec 10 mg am and adding Singulair 10 mg at bedtime. Nasal irrigation with saline as needed. Flonase nasal spray daily as needed. We discussed some side effects of medications. Follow-up as needed.   I discussed the assessment and treatment plan with the patient. Jessica Fry was provided an opportunity to ask questions and all were answered. She agreed with the plan and demonstrated an understanding of the instructions.   Return if symptoms worsen or fail to improve, for Keep next appt..    Johnatha Zeidman [place], MD

## 2019-11-19 ENCOUNTER — Other Ambulatory Visit: Payer: Self-pay | Admitting: Family Medicine

## 2019-11-23 NOTE — Telephone Encounter (Signed)
Refilled for 90 day but pt needs to scheduled CPE for July.

## 2019-12-18 ENCOUNTER — Other Ambulatory Visit: Payer: Self-pay | Admitting: Family Medicine

## 2019-12-18 DIAGNOSIS — E039 Hypothyroidism, unspecified: Secondary | ICD-10-CM

## 2020-01-26 ENCOUNTER — Other Ambulatory Visit: Payer: Self-pay

## 2020-01-26 DIAGNOSIS — E039 Hypothyroidism, unspecified: Secondary | ICD-10-CM

## 2020-01-26 MED ORDER — LEVOTHYROXINE SODIUM 100 MCG PO TABS: ORAL_TABLET | ORAL | 0 refills | Status: DC

## 2020-01-28 ENCOUNTER — Other Ambulatory Visit: Payer: Self-pay | Admitting: Family Medicine

## 2020-02-09 ENCOUNTER — Encounter: Payer: Self-pay | Admitting: Family Medicine

## 2020-02-09 ENCOUNTER — Other Ambulatory Visit: Payer: Self-pay

## 2020-02-09 ENCOUNTER — Other Ambulatory Visit (HOSPITAL_COMMUNITY)
Admission: RE | Admit: 2020-02-09 | Discharge: 2020-02-09 | Disposition: A | Discharge status: Home / Self Care | Payer: Medicare HMO | Source: Ambulatory Visit | Attending: Family Medicine | Admitting: Family Medicine

## 2020-02-09 ENCOUNTER — Ambulatory Visit (INDEPENDENT_AMBULATORY_CARE_PROVIDER_SITE_OTHER): Payer: Medicare HMO | Admitting: Family Medicine

## 2020-02-09 VITALS — BP 124/80 | HR 54 | Temp 98.0°F | Resp 16 | Ht 66.0 in | Wt 211.0 lb

## 2020-02-09 DIAGNOSIS — Z1231 Encounter for screening mammogram for malignant neoplasm of breast: Secondary | ICD-10-CM

## 2020-02-09 DIAGNOSIS — Z780 Asymptomatic menopausal state: Secondary | ICD-10-CM

## 2020-02-09 DIAGNOSIS — Z124 Encounter for screening for malignant neoplasm of cervix: Secondary | ICD-10-CM | POA: Diagnosis not present

## 2020-02-09 DIAGNOSIS — F325 Major depressive disorder, single episode, in full remission: Secondary | ICD-10-CM

## 2020-02-09 DIAGNOSIS — Z1151 Encounter for screening for human papillomavirus (HPV): Secondary | ICD-10-CM | POA: Insufficient documentation

## 2020-02-09 DIAGNOSIS — Z23 Encounter for immunization: Secondary | ICD-10-CM

## 2020-02-09 DIAGNOSIS — F1021 Alcohol dependence, in remission: Secondary | ICD-10-CM

## 2020-02-09 DIAGNOSIS — E039 Hypothyroidism, unspecified: Secondary | ICD-10-CM

## 2020-02-09 DIAGNOSIS — E785 Hyperlipidemia, unspecified: Secondary | ICD-10-CM

## 2020-02-09 DIAGNOSIS — Z0000 Encounter for general adult medical examination without abnormal findings: Secondary | ICD-10-CM | POA: Diagnosis not present

## 2020-02-09 MED ORDER — ESCITALOPRAM OXALATE 20 MG PO TABS: 20.0000 mg | ORAL_TABLET | Freq: Every day | ORAL | 3 refills | Status: DC

## 2020-02-09 NOTE — Assessment & Plan Note (Signed)
Problem is well controlled. For now continue lexapro 20 mg daily, we could consider decreasing medication dose next f/u if problem is stable.

## 2020-02-09 NOTE — Patient Instructions (Addendum)
Today you have you routine preventive visit. A few things to remember from today's visit:  Routine general medical examination at a health care facility  Asymptomatic postmenopausal estrogen deficiency - Plan: DEXAScan  Visit for screening mammogram - Plan: MM Digital Diagnostic Bilat  Cervical cancer screening - Plan: PAP [[facility]]  Primary hypothyroidism - Plan: TSH  Hyperlipidemia, unspecified hyperlipidemia type - Plan: Comprehensive metabolic panel, Lipid panel  Depression, major, in remission (HCC)  Major depressive disorder, single episode, in full remission (HCC) - Plan: escitalopram (LEXAPRO) 20 MG tablet  If you need refills please call your pharmacy. Do not use My Chart to request refills or for acute issues that need immediate attention.    Please be sure medication list is accurate. If a new problem present, please set up appointment sooner than planned today.  At least 150 minutes of moderate exercise per week, daily brisk walking for 15-30 min is a good exercise option. Healthy diet low in saturated (animal) fats and sweets and consisting of fresh fruits and vegetables, lean meats such as fish and white chicken and whole grains.  These are some of recommendations for screening depending of age and risk factors:  - Vaccines:  Tdap vaccine every 10 years.  Shingles vaccine recommended at age 58, could be given after 66 years of age but not sure about insurance coverage.   Pneumonia vaccines: Pneumovax at 65. Sometimes Pneumovax is giving earlier if history of smoking, lung disease,diabetes,kidney disease among some.  Screening for diabetes at age 32 and every 3 years.  Cervical cancer prevention:  Pap smear starts at 66 years of age and continues periodically until 66 years old in low risk women. Pap smear every 3 years between 48 and 15 years old. Pap smear every 3-5 years between women 30 and older if pap smear negative and HPV screening  negative.   -Breast cancer: Mammogram: There is disagreement between experts about when to start screening in low risk asymptomatic female but recent recommendations are to start screening at 27 and not later than 66 years old , every 1-2 years and after 66 yo q 2 years. Screening is recommended until 66 years old but some women can continue screening depending of healthy issues.  Colon cancer screening: Has been recently changed to 66 yo. Insurance may not cover until you are 66 years old. Screening is recommended until 66 years old.  Cholesterol disorder screening at age 71 and every 3 years.  Also recommended:  1. Dental visit- Brush and floss your teeth twice daily; visit your dentist twice a year. 2. Eye doctor- Get an eye exam at least every 2 years. 3. Helmet use- Always wear a helmet when riding a bicycle, motorcycle, rollerblading or skateboarding. 4. Safe sex- If you may be exposed to sexually transmitted infections, use a condom. 5. Seat belts- Seat belts can save your live; always wear one. 6. Smoke/Carbon Monoxide detectors- These detectors need to be installed on the appropriate level of your home. Replace batteries at least once a year. 7. Skin cancer- When out in the sun please cover up and use sunscreen 15 SPF or higher. 8. Violence- If anyone is threatening or hurting you, please tell your healthcare provider.  9. Drink alcohol in moderation- Limit alcohol intake to one drink or less per day. Never drink and drive. 10. Calcium supplementation 1000 to 1200 mg daily, ideally through your diet.  Vitamin D supplementation 800 units daily.

## 2020-02-09 NOTE — Assessment & Plan Note (Signed)
Continue simvastatin 20 mg daily. Low-fat diet is also recommended. We will adjust treatment, if needed, according to lipid panel results.

## 2020-02-09 NOTE — Assessment & Plan Note (Signed)
She has been in remission for years. Attending AA meetings.

## 2020-02-09 NOTE — Progress Notes (Signed)
HPI: [name] is a 64 y.o. female, who is here today for her routine physical.  Last CPE: 11/19/2017.  Regular exercise 3 or more time per week: She is not exercising regularly.  Before COVID-19 pandemic she was swimming, she is planning on going back. Following a healthy diet: She has decreased meat intake, she does not eat pork, bacon.  She is eating TV dinners and snacking on cookies. She lives alone.  Chronic medical problems: Depression, anxiety, hypothyroidism, hyperlipidemia. Hx of alcohol, she has been sober for many years, attending AA meetings.  Pap smear: 2018. Hx of abnormal pap smears: Negative Hx of STD's: Negative AM: 4513 LMP 64 year old. G2 L2. She is not sexually active.  Immunization History  Administered Date(s) Administered  . Influenza Inj Mdck Quad Pf 03/18/2018  . Influenza,inj,Quad PF,6+ Mos 04/25/2017, 03/14/2019  . Influenza-Unspecified 03/18/2018, 03/13/2019  . PFIZER SARS-COV-2 Vaccination 08/15/2019, 09/09/2019  . Pneumococcal Polysaccharide-23 02/09/2020   Mammogram: 01/14/18 Colonoscopy: Right before moving to [place], 6 years ago. DEXA: Never Hep C screening: 11/09/17.  She has no new concerns today. Depression and anxiety: Currently she is on Lexapro 20 mg daily. She has been on pharmacologic treatment for about 10 to 11 years. She is tolerating medication well, she still feels like medication is helping. She enjoys painting and recently she started carving in wood.  Hyperlipidemia: Currently she is on simvastatin 20 mg daily.  Lab Results  Component Value Date   CHOL 179 02/09/2019   HDL 52.20 02/09/2019   LDLCALC 92 02/09/2019   TRIG 171.0 (H) 02/09/2019   CHOLHDL 3 02/09/2019   Hypothyroidism: Currently she is on levothyroxine 50 mcg, 1 tablet daily x5 days and 1.5 tablets Mondays and Wednesdays.  Lab Results  Component Value Date   TSH 1.42 02/09/2019    Review of Systems  Constitutional: Positive for fatigue.  Negative for appetite change and fever.  HENT: Negative for dental problem, hearing loss, mouth sores and sore throat.   Eyes: Negative for redness and visual disturbance.  Respiratory: Negative for cough, shortness of breath and wheezing.   Cardiovascular: Negative for chest pain, palpitations and leg swelling.  Gastrointestinal: Negative for abdominal pain, nausea and vomiting.       No changes in bowel habits.  Endocrine: Negative for cold intolerance, polydipsia, polyphagia and polyuria.  Genitourinary: Negative for decreased urine volume, dysuria, hematuria, vaginal bleeding and vaginal discharge.  Musculoskeletal: Negative for gait problem and myalgias.  Skin: Negative for color change and rash.  Allergic/Immunologic: Positive for environmental allergies.  Neurological: Negative for syncope, weakness and headaches.  Hematological: Negative for adenopathy. Does not bruise/bleed easily.  Psychiatric/Behavioral: Negative for confusion and sleep disturbance. The patient is nervous/anxious.   All other systems reviewed and are negative.   Current Outpatient Medications on File Prior to Visit  Medication Sig Dispense Refill  . BABY ASPIRIN PO Take 81 mg by mouth daily.     . Cholecalciferol (VITAMIN D3 PO) Take 1 tablet by mouth daily.     . fluticasone (FLONASE) 50 MCG/ACT nasal spray Place 1 spray into both nostrils 2 (two) times daily. 16 g 3  . levothyroxine (SYNTHROID) 100 MCG tablet TAKE 1 AND 1/2 TABLETS BY MOUTH MONDAY AND WEDNESDAY AND 1 TABLET BY MOUTH ALL OTHER DAYS 35 tablet 0  . simvastatin (ZOCOR) 20 MG tablet TAKE 1 TABLET BY MOUTH EVERYDAY AT BEDTIME 90 tablet 0   No current facility-administered medications on file prior to visit.  Past Medical History:  Diagnosis Date  . Alcohol addiction (HCC)   . Allergy   . Chicken pox   . Depression   . Eczema   . GERD (gastroesophageal reflux disease)   . Hyperlipidemia   . Thyroid disease    Hypothyroid  . Urine  incontinence    Past Surgical History:  Procedure Laterality Date  . nose-deviated septum injury  1972    Allergies  Allergen Reactions  . Ampicillin Hives  . Penicillins Hives    Did it involve swelling of the face/tongue/throat, SOB, or low BP? No Did it involve sudden or severe rash/hives, skin peeling, or any reaction on the inside of your mouth or nose? Yes Did you need to seek medical attention at a hospital or doctor's office? No When did it last happen?unk If all above answers are "NO", may proceed with cephalosporin use.     Family History  Problem Relation Age of Onset  . Early death Mother   . Miscarriages / [place] Mother   . Alcohol abuse Father   . Depression Father   . Hearing loss Father   . Stroke Father   . Early death Brother   . Heart attack Maternal Grandmother   . Stroke Paternal Grandmother     Social History   Socioeconomic History  . Marital status: Divorced    Spouse name: Not on file  . Number of children: 2  . Years of education: Not on file  . Highest education level: Not on file  Occupational History  . Not on file  Tobacco Use  . Smoking status: Never Smoker  . Smokeless tobacco: Never Used  Vaping Use  . Vaping Use: Never used  Substance and Sexual Activity  . Alcohol use: No    Comment: 3 1/2 years sober  . Drug use: No  . Sexual activity: Never    Comment: both previously  Other Topics Concern  . Not on file  Social History Narrative  . Not on file   Social Determinants of Health   Financial Resource Strain:   . Difficulty of Paying Living Expenses:   Food Insecurity:   . Worried About Programme researcher, broadcasting/film/video in the Last Year:   . Barista in the Last Year:   Transportation Needs:   . Freight forwarder (Medical):   Marland Kitchen Lack of Transportation (Non-Medical):   Physical Activity:   . Days of Exercise per Week:   . Minutes of Exercise per Session:   Stress:   . Feeling of Stress :   Social  Connections:   . Frequency of Communication with Friends and Family:   . Frequency of Social Gatherings with Friends and Family:   . Attends Religious Services:   . Active Member of Clubs or Organizations:   . Attends Banker Meetings:   Marland Kitchen Marital Status:    Vitals:   02/09/20 1158  BP: 124/80  Pulse: (!) 54  Resp: 16  Temp: 98 F (36.7 C)  SpO2: 96%   Body mass index is 34.06 kg/m.  Wt Readings from Last 3 Encounters:  02/09/20 211 lb (95.7 kg)  06/04/19 212 lb 1.3 oz (96.2 kg)  08/20/18 212 lb (96.2 kg)   Physical Exam Vitals and nursing note reviewed. Exam conducted with a chaperone present.  Constitutional:      General: She is not in acute distress.    Appearance: She is well-developed.  HENT:     Head: Normocephalic  and atraumatic.     Right Ear: Hearing, tympanic membrane, ear canal and external ear normal.     Left Ear: Hearing, tympanic membrane, ear canal and external ear normal.     Mouth/Throat:     Mouth: Mucous membranes are moist.     Pharynx: Oropharynx is clear. Uvula midline.  Eyes:     Conjunctiva/sclera: Conjunctivae normal.     Pupils: Pupils are equal, round, and reactive to light.  Neck:     Thyroid: No thyromegaly.     Trachea: No tracheal deviation.  Cardiovascular:     Rate and Rhythm: Regular rhythm. Bradycardia present.     Pulses:          Dorsalis pedis pulses are 2+ on the right side and 2+ on the left side.     Heart sounds: No murmur heard.   Pulmonary:     Effort: Pulmonary effort is normal. No respiratory distress.     Breath sounds: Normal breath sounds.  Abdominal:     Palpations: Abdomen is soft. There is no hepatomegaly or mass.     Tenderness: There is no abdominal tenderness.  Genitourinary:    Labia:        Right: No rash, tenderness or lesion.        Left: No rash, tenderness or lesion.      Vagina: No vaginal discharge, erythema or tenderness.     Cervix: No cervical motion tenderness, discharge or  friability.     Uterus: With uterine prolapse (mild). Not enlarged and not tender.      Adnexa:        Right: No mass, tenderness or fullness.         Left: No mass, tenderness or fullness.       Comments: Breast: No masses, skin abnormalities, or nipple discharge appreciated bilateral. Vaginal mucosa atrophic. + Cystocele. Pap smear collected. Musculoskeletal:     Comments: No signs of synovitis appreciated.  Lymphadenopathy:     Cervical: No cervical adenopathy.     Upper Body:     Right upper body: No supraclavicular or axillary adenopathy.     Left upper body: No supraclavicular or axillary adenopathy.     Lower Body: No right inguinal adenopathy. No left inguinal adenopathy.  Skin:    General: Skin is warm.     Findings: No erythema or rash.  Neurological:     General: No focal deficit present.     Mental Status: She is alert and oriented to person, place, and time.     Cranial Nerves: No cranial nerve deficit.     Coordination: Coordination normal.     Gait: Gait normal.     Deep Tendon Reflexes:     Reflex Scores:      Bicep reflexes are 2+ on the right side and 2+ on the left side.      Patellar reflexes are 2+ on the right side and 2+ on the left side. Psychiatric:        Mood and Affect: Mood and affect normal.        Speech: Speech normal.     Comments: Well groomed, good eye contact.    ASSESSMENT AND PLAN:  Jessica Fry was here today annual physical examination.  Orders Placed This Encounter  Procedures  . DEXAScan  . MM 3D SCREEN BREAST BILATERAL  . Comprehensive metabolic panel  . Lipid panel  . TSH    Lab Results  Component Value Date  TSH 1.41 02/09/2020   Lab Results  Component Value Date   CREATININE 0.88 02/09/2020   BUN 20 02/09/2020   NA 139 02/09/2020   K 4.2 02/09/2020   CL 107 02/09/2020   CO2 24 02/09/2020   Lab Results  Component Value Date   ALT 13 02/09/2020   AST 16 02/09/2020   ALKPHOS 67 02/09/2019    BILITOT 0.5 02/09/2020   Lab Results  Component Value Date   CHOL 187 02/09/2020   HDL 60 02/09/2020   LDLCALC 99 02/09/2020   TRIG 184 (H) 02/09/2020   CHOLHDL 3.1 02/09/2020    Routine general medical examination at a health care facility We discussed the importance of regular physical activity and healthy diet for prevention of chronic illness and/or complications. Preventive guidelines reviewed. Vaccination: Updated.  Ca++ and vit D supplementation recommended. Next CPE in a year.  The 10-year ASCVD risk score Denman George DC Montez Hageman., et al., 2013) is: 4.9%   Values used to calculate the score:     Age: 6 years     Sex: Female     Is Non-Hispanic African American: No     Diabetic: No     Tobacco smoker: No     Systolic Blood Pressure: 124 mmHg     Is BP treated: No     HDL Cholesterol: 60 mg/dL     Total Cholesterol: 187 mg/dL  Asymptomatic postmenopausal estrogen deficiency -     DEXAScan; Future  Visit for screening mammogram -     MM 3D SCREEN BREAST BILATERAL; Future  Cervical cancer screening -     PAP [[place]]  Primary hypothyroidism Continue levothyroxine 100 mcg 1.5 tablet Mondays and Wednesdays and 1 tablet the rest of the week. Further recommendation will be given according to TSH results.  Hyperlipidemia Continue simvastatin 20 mg daily. Low-fat diet is also recommended. We will adjust treatment, if needed, according to lipid panel results.   Depression, major, in remission (HCC) Problem is well controlled. For now continue lexapro 20 mg daily, we could consider decreasing medication dose next f/u if problem is stable.  History of alcohol dependence (HCC) She has been in remission for years. Attending AA meetings.   Return in 1 year (on 02/08/2021) for She needs a AWV , ideally before 04/06/2020.Marland Kitchen   Goebel Hellums G. [place], MD  [facility]. [place] office.

## 2020-02-09 NOTE — Assessment & Plan Note (Signed)
Continue levothyroxine 100 mcg 1.5 tablet Mondays and Wednesdays and 1 tablet the rest of the week. Further recommendation will be given according to TSH results.

## 2020-02-10 LAB — CYTOLOGY - PAP
Comment: NEGATIVE
Diagnosis: NEGATIVE
High risk HPV: NEGATIVE

## 2020-02-10 LAB — LIPID PANEL
Cholesterol: 187 mg/dL (ref <200)
HDL: 60 mg/dL (ref >=50)
LDL Cholesterol (Calc): 99 mg/dL (calc)
Non-HDL Cholesterol (Calc): 127 mg/dL (calc) (ref <130)
Total CHOL/HDL Ratio: 3.1 (calc) (ref <5.0)
Triglycerides: 184 mg/dL — ABNORMAL HIGH (ref <150)

## 2020-02-10 LAB — COMPREHENSIVE METABOLIC PANEL
AG Ratio: 1.7 (calc) (ref 1.0–2.5)
ALT: 13 U/L (ref 6–29)
AST: 16 U/L (ref 10–35)
Albumin: 4.3 g/dL (ref 3.6–5.1)
Alkaline phosphatase (APISO): 76 U/L (ref 37–153)
BUN: 20 mg/dL (ref 7–25)
CO2: 24 mmol/L (ref 20–32)
Calcium: 9.6 mg/dL (ref 8.6–10.4)
Chloride: 107 mmol/L (ref 98–110)
Creat: 0.88 mg/dL (ref 0.50–0.99)
Globulin: 2.6 g/dL (calc) (ref 1.9–3.7)
Glucose, Bld: 92 mg/dL (ref 65–99)
Potassium: 4.2 mmol/L (ref 3.5–5.3)
Sodium: 139 mmol/L (ref 135–146)
Total Bilirubin: 0.5 mg/dL (ref 0.2–1.2)
Total Protein: 6.9 g/dL (ref 6.1–8.1)

## 2020-02-10 LAB — TSH: TSH: 1.41 mIU/L (ref 0.40–4.50)

## 2020-02-12 ENCOUNTER — Telehealth: Payer: Self-pay | Admitting: Family Medicine

## 2020-02-12 NOTE — Telephone Encounter (Signed)
error 

## 2020-02-16 NOTE — Telephone Encounter (Signed)
Patient is scheduled, will send link to her email.

## 2020-02-16 NOTE — Telephone Encounter (Signed)
Pt stated she can do the virtual tomorrow at 4:15 but ask if the link can be sent via email [email or web address]

## 2020-02-16 NOTE — Telephone Encounter (Signed)
Can you offer her the 4:15 slot for a virtual tomorrow? We won't be here Thursday or Friday - want to make sure she's taken care of before we're out.

## 2020-02-16 NOTE — Telephone Encounter (Signed)
Pt stated she has been sick with a runny/stuffy nose and chest cold for 4 days now. She went and got a COVID test at [facility] currently waiting for those results. However, the pt stated she has been having burning/itching around her vulva for 3 days now and would like advice on what her PCP wants to do depending on a positive or neg test?    Pt can be reached at [phone]

## 2020-02-17 ENCOUNTER — Telehealth (INDEPENDENT_AMBULATORY_CARE_PROVIDER_SITE_OTHER): Payer: Medicare HMO | Admitting: Family Medicine

## 2020-02-17 ENCOUNTER — Ambulatory Visit: Payer: Medicare HMO

## 2020-02-17 ENCOUNTER — Encounter: Payer: Self-pay | Admitting: Family Medicine

## 2020-02-17 VITALS — Ht 66.0 in

## 2020-02-17 DIAGNOSIS — B373 Candidiasis of vulva and vagina: Secondary | ICD-10-CM | POA: Diagnosis not present

## 2020-02-17 DIAGNOSIS — J069 Acute upper respiratory infection, unspecified: Secondary | ICD-10-CM

## 2020-02-17 DIAGNOSIS — L739 Follicular disorder, unspecified: Secondary | ICD-10-CM | POA: Diagnosis not present

## 2020-02-17 DIAGNOSIS — E785 Hyperlipidemia, unspecified: Secondary | ICD-10-CM

## 2020-02-17 DIAGNOSIS — B3731 Acute candidiasis of vulva and vagina: Secondary | ICD-10-CM

## 2020-02-17 MED ORDER — SIMVASTATIN 40 MG PO TABS: 40.0000 mg | ORAL_TABLET | Freq: Every day | ORAL | 2 refills | Status: DC

## 2020-02-17 NOTE — Progress Notes (Signed)
Virtual Visit via Video Note   I connected with [name] on 02/17/20 by a video enabled telemedicine application and verified that I am speaking with the correct person using two identifiers.  Location patient: home Location provider:work office Persons participating in the virtual visit: patient, provider  I discussed the limitations of evaluation and management by telemedicine and the availability of in person appointments. The patient expressed understanding and agreed to proceed.  Chief Complaint  Patient presents with  . URI    HPI: [name] is a 64 yo female with hx of allergies,hypothyroidism,and depression c/o respiratory symptoms for a few days. Started with sore throat,rhinorrhea,post nasal drainage,nasal congestion, and fatigue. Cough with whitish/yellowish sputum. No hemoptysis,ageusia,or anosmia.  Cough does not interfere with sleep. Negative for SOB,CP,wheezing,N/V,diarreha,or abdominal pain.  Max tem was 99.1 F. She has taken OTC expectorant and it has helped. Symptoms have improved. She had a negative COVID 19 test at [facility].  No sick contact. COVID 19 vaccination completed.  5-7 days started with achy vulva, pruritus,and burning. Monistat x 3 days. OTC treatment helped.  Today she noted 2 well defined tender lesions on external genitalia. She has not noted vaginal discharge or bleeding. No urinary symptoms. She is not sexually active.  She had blood work recently. Simvastatin dose was changed from 20 mg to 40 mg. She is taking 2 tabs and has tolerated new dose well.  Lab Results  Component Value Date   CHOL 187 02/09/2020   HDL 60 02/09/2020   LDLCALC 99 02/09/2020   TRIG 184 (H) 02/09/2020   CHOLHDL 3.1 02/09/2020   ROS: See pertinent positives and negatives per HPI.  Past Medical History:  Diagnosis Date  . Alcohol addiction (HCC)   . Allergy   . Chicken pox   . Depression   . Eczema   . GERD (gastroesophageal reflux disease)    . Hyperlipidemia   . Thyroid disease    Hypothyroid  . Urine incontinence     Past Surgical History:  Procedure Laterality Date  . nose-deviated septum injury  1972    Family History  Problem Relation Age of Onset  . Early death Mother   . Miscarriages / [place] Mother   . Alcohol abuse Father   . Depression Father   . Hearing loss Father   . Stroke Father   . Early death Brother   . Heart attack Maternal Grandmother   . Stroke Paternal Grandmother     Social History   Socioeconomic History  . Marital status: Divorced    Spouse name: Not on file  . Number of children: 2  . Years of education: Not on file  . Highest education level: Not on file  Occupational History  . Not on file  Tobacco Use  . Smoking status: Never Smoker  . Smokeless tobacco: Never Used  Vaping Use  . Vaping Use: Never used  Substance and Sexual Activity  . Alcohol use: No    Comment: 3 1/2 years sober  . Drug use: No  . Sexual activity: Never    Comment: both previously  Other Topics Concern  . Not on file  Social History Narrative  . Not on file   Social Determinants of Health   Financial Resource Strain:   . Difficulty of Paying Living Expenses:   Food Insecurity:   . Worried About Programme researcher, broadcasting/film/video in the Last Year:   . Barista in the Last Year:   Transportation Needs:   .  Lack of Transportation (Medical):   Marland Kitchen Lack of Transportation (Non-Medical):   Physical Activity:   . Days of Exercise per Week:   . Minutes of Exercise per Session:   Stress:   . Feeling of Stress :   Social Connections:   . Frequency of Communication with Friends and Family:   . Frequency of Social Gatherings with Friends and Family:   . Attends Religious Services:   . Active Member of Clubs or Organizations:   . Attends Banker Meetings:   Marland Kitchen Marital Status:   Intimate Partner Violence:   . Fear of Current or Ex-Partner:   . Emotionally Abused:   Marland Kitchen Physically Abused:    . Sexually Abused:     Current Outpatient Medications:  .  BABY ASPIRIN PO, Take 81 mg by mouth daily. , Disp: , Rfl:  .  Cholecalciferol (VITAMIN D3 PO), Take 1 tablet by mouth daily. , Disp: , Rfl:  .  escitalopram (LEXAPRO) 20 MG tablet, Take 1 tablet (20 mg total) by mouth daily., Disp: 90 tablet, Rfl: 3 .  fluticasone (FLONASE) 50 MCG/ACT nasal spray, Place 1 spray into both nostrils 2 (two) times daily., Disp: 16 g, Rfl: 3 .  levothyroxine (SYNTHROID) 100 MCG tablet, TAKE 1 AND 1/2 TABLETS BY MOUTH MONDAY AND WEDNESDAY AND 1 TABLET BY MOUTH ALL OTHER DAYS, Disp: 35 tablet, Rfl: 0 .  simvastatin (ZOCOR) 40 MG tablet, Take 1 tablet (40 mg total) by mouth at bedtime., Disp: 90 tablet, Rfl: 2  EXAM:  VITALS per patient if applicable:Ht 5\' 6"  (1.676 m)   BMI 34.06 kg/m   GENERAL: alert, oriented, appears well and in no acute distress  HEENT: atraumatic, conjunctiva clear, no obvious abnormalities on inspection.  NECK: normal movements of the head and neck  LUNGS: on inspection no signs of respiratory distress, breathing rate appears normal, no obvious gross SOB, gasping or wheezing  CV: no obvious cyanosis  MS: moves all visible extremities without noticeable abnormality  PSYCH/NEURO: pleasant and cooperative, no obvious depression or anxiety, speech and thought processing grossly intact  ASSESSMENT AND PLAN:  Discussed the following assessment and plan:  Viral upper respiratory tract infection Symptoms she reports suggest viral URI. Improved. Cough can last a few more days and even weeks due to her hx of seasonal allergies. Continue OTC medications. Plain Mucinex and adequate hydration may also help with symptoms. Monitor for signs of complications.  Vulval candidiasis OTC Monistat seemed to help. If problem re-occurs , will need OV.  Folliculitis Hx does not suggest a serious process. I do not think abx treatment is needed at this time and it may aggravate yeast  infection. Sitz bath with warm water and epsom salt a few times during the week may help. Monitor for new lesions.  Hyperlipidemia, unspecified hyperlipidemia type New dose of Simvastatin sent to her pharmacy. F/U in 06/2020 or 07/2020.  I discussed the assessment and treatment plan with the patient. [name] was provided an opportunity to ask questions and all were answered. She agreed with the plan and demonstrated an understanding of the instructions.    Return if symptoms worsen or fail to improve.   Joshawn Crissman Sheran Luz, MD

## 2020-02-20 ENCOUNTER — Encounter: Payer: Self-pay | Admitting: Family Medicine

## 2020-02-29 ENCOUNTER — Other Ambulatory Visit: Payer: Self-pay | Admitting: Family Medicine

## 2020-02-29 DIAGNOSIS — E039 Hypothyroidism, unspecified: Secondary | ICD-10-CM

## 2020-04-30 ENCOUNTER — Other Ambulatory Visit: Payer: Self-pay | Admitting: Family Medicine

## 2020-05-09 ENCOUNTER — Encounter: Payer: Self-pay | Admitting: Family Medicine

## 2020-05-09 ENCOUNTER — Other Ambulatory Visit: Payer: Self-pay

## 2020-05-09 ENCOUNTER — Ambulatory Visit: Payer: Medicare HMO | Admitting: Family Medicine

## 2020-05-09 VITALS — BP 114/76 | HR 57 | Temp 98.4°F | Resp 16 | Ht 66.0 in | Wt 211.5 lb

## 2020-05-09 DIAGNOSIS — G4482 Headache associated with sexual activity: Secondary | ICD-10-CM | POA: Diagnosis not present

## 2020-05-09 DIAGNOSIS — F330 Major depressive disorder, recurrent, mild: Secondary | ICD-10-CM | POA: Diagnosis not present

## 2020-05-09 DIAGNOSIS — F325 Major depressive disorder, single episode, in full remission: Secondary | ICD-10-CM

## 2020-05-09 MED ORDER — VENLAFAXINE HCL ER 75 MG PO CP24: 75.0000 mg | ORAL_CAPSULE | Freq: Every day | ORAL | 2 refills | Status: DC

## 2020-05-09 MED ORDER — ESCITALOPRAM OXALATE 20 MG PO TABS: 10.0000 mg | ORAL_TABLET | Freq: Every day | ORAL | 0 refills | Status: DC

## 2020-05-09 NOTE — Progress Notes (Signed)
Chief Complaint  Patient presents with  . Headache   HPI: [name] is a 64 y.o. female, who is here today with above concern. She has had 2 episodes in the past month, it just happens with masturbation and orgasm. No prior history of headache. Parietotemporal sharp/pressure headache, 8/10. Headache can last about 10 minutes. No associated visual changes, photophobia, vomiting, or focal neurologic deficit. She had an episode of nausea when she was riding her bike, she is not sure if she also had headache at that time.  She has not taken any OTC medication.  She feels like her depression is getting worse. 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 Within a week 2-3 "down days." No recent event that could have aggravated her symptoms. Negative for manual like symptoms. Negative for history of bipolar disorder.  Sleeping about 9 hours, goes to bed at 1 Am, stays in bed during day time. . Day feels "alone and empty."  She lives alone and communicates regularly with her son.  She has been on Lexapro 20 mg for about 10 years. She has tried Zoloft, it caused "shakiness." She went to a therapist last months. She is doing relaxation exercises.  Depression screen [facility] 2/9 05/10/2020 02/09/2020  Decreased Interest 1 1  Down, Depressed, Hopeless 2 1  PHQ - 2 Score 3 2  Altered sleeping 2 0  Tired, decreased energy 2 0  Change in appetite 1 1  Feeling bad or failure about yourself  1 0  Trouble concentrating 1 0  Moving slowly or fidgety/restless 1 1  Suicidal thoughts 0 0  PHQ-9 Score 11 4  Difficult doing work/chores Very difficult Not difficult at all    + Fatigue. Hypothyroidism: She is currently on levothyroxine 100 mcg, she is taking medication as instructed.  Lab Results  Component Value Date   TSH 1.41 02/09/2020   Review of Systems  Constitutional: Positive for activity change and appetite change. Negative for fever.    HENT: Negative for mouth sores, nosebleeds and sore throat.   Respiratory: Negative for cough, shortness of breath and wheezing.   Cardiovascular: Negative for chest pain, palpitations and leg swelling.  Gastrointestinal: Negative for abdominal pain.       Negative for changes in bowel habits.  Genitourinary: Negative for decreased urine volume, dysuria and hematuria.  Neurological: Negative for syncope and facial asymmetry.  Psychiatric/Behavioral: Negative for confusion and hallucinations.  Rest see pertinent positives and negatives per HPI.  Current Outpatient Medications on File Prior to Visit  Medication Sig Dispense Refill  . BABY ASPIRIN PO Take 81 mg by mouth daily.     . Cholecalciferol (VITAMIN D3 PO) Take 1 tablet by mouth daily.     . fluticasone (FLONASE) 50 MCG/ACT nasal spray Place 1 spray into both nostrils 2 (two) times daily. 16 g 3  . levothyroxine (SYNTHROID) 100 MCG tablet TAKE 1 AND 1/2 TABLETS BY MOUTH MONDAY AND WEDNESDAY AND 1 TABLET BY MOUTH ALL OTHER DAYS 105 tablet 3  . simvastatin (ZOCOR) 40 MG tablet Take 1 tablet (40 mg total) by mouth at bedtime. 90 tablet 2   No current facility-administered medications on file prior to visit.   Past Medical History:  Diagnosis Date  . Alcohol addiction (HCC)   . Allergy   . Chicken pox   . Depression   . Eczema   . GERD (gastroesophageal reflux disease)   . Hyperlipidemia   . Thyroid  disease    Hypothyroid  . Urine incontinence    Allergies  Allergen Reactions  . Ampicillin Hives  . Penicillins Hives    Did it involve swelling of the face/tongue/throat, SOB, or low BP? No Did it involve sudden or severe rash/hives, skin peeling, or any reaction on the inside of your mouth or nose? Yes Did you need to seek medical attention at a hospital or doctor's office? No When did it last happen?unk If all above answers are "NO", may proceed with cephalosporin use.     Social History   Socioeconomic History   . Marital status: Divorced    Spouse name: Not on file  . Number of children: 2  . Years of education: Not on file  . Highest education level: Not on file  Occupational History  . Not on file  Tobacco Use  . Smoking status: Never Smoker  . Smokeless tobacco: Never Used  Vaping Use  . Vaping Use: Never used  Substance and Sexual Activity  . Alcohol use: No    Comment: 3 1/2 years sober  . Drug use: No  . Sexual activity: Never    Comment: both previously  Other Topics Concern  . Not on file  Social History Narrative  . Not on file   Social Determinants of Health   Financial Resource Strain:   . Difficulty of Paying Living Expenses: Not on file  Food Insecurity:   . Worried About Programme researcher, broadcasting/film/video in the Last Year: Not on file  . Ran Out of Food in the Last Year: Not on file  Transportation Needs:   . Lack of Transportation (Medical): Not on file  . Lack of Transportation (Non-Medical): Not on file  Physical Activity:   . Days of Exercise per Week: Not on file  . Minutes of Exercise per Session: Not on file  Stress:   . Feeling of Stress : Not on file  Social Connections:   . Frequency of Communication with Friends and Family: Not on file  . Frequency of Social Gatherings with Friends and Family: Not on file  . Attends Religious Services: Not on file  . Active Member of Clubs or Organizations: Not on file  . Attends Banker Meetings: Not on file  . Marital Status: Not on file    Vitals:   05/09/20 1414  BP: 114/76  Pulse: (!) 57  Resp: 16  Temp: 98.4 F (36.9 C)  SpO2: 97%   Wt Readings from Last 3 Encounters:  05/09/20 211 lb 8 oz (95.9 kg)  02/09/20 211 lb (95.7 kg)  06/04/19 212 lb 1.3 oz (96.2 kg)   Body mass index is 34.14 kg/m.  Physical Exam Vitals and nursing note reviewed.  Constitutional:      General: She is not in acute distress.    Appearance: She is well-developed.  HENT:     Head: Normocephalic and atraumatic.      Mouth/Throat:     Mouth: Mucous membranes are moist.  Eyes:     Conjunctiva/sclera: Conjunctivae normal.     Pupils: Pupils are equal, round, and reactive to light.     Funduscopic exam:    Right eye: No hemorrhage or exudate.        Left eye: No hemorrhage or exudate.  Cardiovascular:     Rate and Rhythm: Regular rhythm. Bradycardia present.     Heart sounds: No murmur heard.   Pulmonary:     Effort: Pulmonary effort is normal.  No respiratory distress.     Breath sounds: Normal breath sounds.  Abdominal:     Palpations: Abdomen is soft. There is no hepatomegaly or mass.     Tenderness: There is no abdominal tenderness.  Lymphadenopathy:     Cervical: No cervical adenopathy.  Skin:    General: Skin is warm.     Findings: No erythema or rash.  Neurological:     General: No focal deficit present.     Mental Status: She is alert and oriented to person, place, and time.     Cranial Nerves: No cranial nerve deficit.     Gait: Gait normal.     Deep Tendon Reflexes:     Reflex Scores:      Patellar reflexes are 2+ on the right side and 2+ on the left side. Psychiatric:        Mood and Affect: Mood is depressed.        Thought Content: Thought content does not include homicidal or suicidal ideation. Thought content does not include homicidal or suicidal plan.    ASSESSMENT AND PLAN:  [name] was seen today for headache.  Diagnoses and all orders for this visit:  Orders Placed This Encounter  Procedures  . MR Brain W Wo Contrast    Headache associated with orgasm We discussed possible etiologies,?  Tension headache. Because it is associated with orgasms, brain MRI is being ordered. For now avoid trigger factors. Effexor may help. We could consider amitriptyline if headache is persistent. Instructed about warning signs.  Mild episode of recurrent major depressive disorder (HCC) Problem is not well controlled. She agrees with starting Effexor XR 75 mg daily. Decrease  Lexapro from 20 mg to 10 mg. Instructed about warning signs. Follow-up in 4 to 6 weeks.  -     venlafaxine XR (EFFEXOR XR) 75 MG 24 hr capsule; Take 1 capsule (75 mg total) by mouth daily with breakfast. -     escitalopram (LEXAPRO) 20 MG tablet; Take 0.5 tablets (10 mg total) by mouth daily.   Return in about 4 weeks (around 06/06/2020).   Margene Cherian G. [place], MD  [facility]. [place] office.  A few things to remember from today's visit:  Headache associated with orgasm - Plan: MR Brain W Wo Contrast  Major depressive disorder, single episode, in full remission (HCC) - Plan: escitalopram (LEXAPRO) 20 MG tablet  Lexapro decreased to 10 mg (1/2 tab). Effexor 75 mg started today.  If you need refills please call your pharmacy. Do not use My Chart to request refills or for acute issues that need immediate attention.    Please be sure medication list is accurate. If a new problem present, please set up appointment sooner than planned today.

## 2020-05-09 NOTE — Patient Instructions (Addendum)
A few things to remember from today's visit:  Headache associated with orgasm - Plan: MR Brain W Wo Contrast  Major depressive disorder, single episode, in full remission (HCC) - Plan: escitalopram (LEXAPRO) 20 MG tablet  Lexapro decreased to 10 mg (1/2 tab). Effexor 75 mg started today.  If you need refills please call your pharmacy. Do not use My Chart to request refills or for acute issues that need immediate attention.    Please be sure medication list is accurate. If a new problem present, please set up appointment sooner than planned today.

## 2020-05-09 NOTE — Assessment & Plan Note (Signed)
Problem is getting worse. She has try another SSRI in the past, so recommend trying Effexor XR 75 mg daily. We will start decreasing dose of Lexapro, recommend decreasing dose from 20 to 30 mg. Follow-up in 4 weeks, before if needed.

## 2020-05-29 ENCOUNTER — Ambulatory Visit
Admission: RE | Admit: 2020-05-29 | Discharge: 2020-05-29 | Disposition: A | Discharge status: Home / Self Care | Payer: Medicare HMO | Source: Ambulatory Visit | Attending: Family Medicine | Admitting: Family Medicine

## 2020-05-29 DIAGNOSIS — G4482 Headache associated with sexual activity: Secondary | ICD-10-CM

## 2020-05-29 MED ORDER — GADOBENATE DIMEGLUMINE 529 MG/ML IV SOLN
20.0000 mL | Freq: Once | INTRAVENOUS | Status: AC | PRN
  Administered 2020-05-29: 20 mL via INTRAVENOUS

## 2020-06-08 ENCOUNTER — Other Ambulatory Visit: Payer: Self-pay

## 2020-06-08 ENCOUNTER — Ambulatory Visit: Payer: Medicare HMO | Admitting: Family Medicine

## 2020-06-08 ENCOUNTER — Encounter: Payer: Self-pay | Admitting: Family Medicine

## 2020-06-08 VITALS — BP 124/70 | HR 69 | Temp 97.8°F | Resp 16 | Ht 66.0 in | Wt 214.5 lb

## 2020-06-08 DIAGNOSIS — Z0000 Encounter for general adult medical examination without abnormal findings: Secondary | ICD-10-CM | POA: Diagnosis not present

## 2020-06-08 DIAGNOSIS — F330 Major depressive disorder, recurrent, mild: Secondary | ICD-10-CM

## 2020-06-08 MED ORDER — ESCITALOPRAM OXALATE 10 MG PO TABS: ORAL_TABLET | ORAL | 0 refills | Status: DC

## 2020-06-08 MED ORDER — VENLAFAXINE HCL ER 75 MG PO CP24: 75.0000 mg | ORAL_CAPSULE | Freq: Every day | ORAL | 2 refills | Status: DC

## 2020-06-08 NOTE — Patient Instructions (Addendum)
°  [name] , Thank you for taking time to come for your Medicare Wellness Visit. I appreciate your ongoing commitment to your health goals. Please review the following plan we discussed and let me know if I can assist you in the future.   These are the goals we discussed: Goals     DIET - REDUCE CALORIE INTAKE     Exercise 3x per week (30 min per time)     10 min of walking at the time 2-3 times daily.       This is a list of the screening recommended for you and due dates:  Health Maintenance  Topic Date Due   DEXA scan (bone density measurement)  Never done   Mammogram  01/15/2020   Flu Shot  02/07/2020   Tetanus Vaccine  07/15/2020*   HIV Screening  10/26/2023*   Colon Cancer Screening  02/09/2024*   Pneumonia vaccines (2 of 2 - PCV13) 02/08/2021   Pap Smear  02/09/2023   COVID-19 Vaccine  Completed    Hepatitis C: One time screening is recommended by Center for Disease Control  (CDC) for  adults born from [birth year] through 1967.   Completed  *Topic was postponed. The date shown is not the original due date.    A few things to remember from today's visit:  Mild episode of recurrent major depressive disorder (HCC) - Plan: venlafaxine XR (EFFEXOR XR) 75 MG 24 hr capsule, escitalopram (LEXAPRO) 10 MG tablet  If you need refills please call your pharmacy. Do not use My Chart to request refills or for acute issues that need immediate attention.   Lexapro to be decreased from 10 mg to 5 mg daily for 3 weeks then every 2 days for 2 weeks and then 3rd for a week. No changes in Effexor.  Please be sure medication list is accurate. If a new problem present, please set up appointment sooner than planned today.

## 2020-06-08 NOTE — Progress Notes (Signed)
HPI: [name] is a 64 y.o. female, who is here today to follow on recent OV and AWV. She has not had a AWV and agrees with doing it today.  She lives alone. She keeps in touch with her son. Independent ADL's and IADL's.  Immunization History  Administered Date(s) Administered  . Influenza Inj Mdck Quad Pf 03/18/2018  . Influenza,inj,Quad PF,6+ Mos 04/25/2017, 03/14/2019  . Influenza-Unspecified 03/18/2018, 03/13/2019  . PFIZER SARS-COV-2 Vaccination 08/15/2019, 09/09/2019  . Pneumococcal Polysaccharide-23 02/09/2020    Functional Status Survey: Is the patient deaf or have difficulty hearing?: No Does the patient have difficulty seeing, even when wearing glasses/contacts?: No Does the patient have difficulty concentrating, remembering, or making decisions?: No Does the patient have difficulty walking or climbing stairs?: No Does the patient have difficulty dressing or bathing?: No Does the patient have difficulty doing errands alone such as visiting a doctor's office or shopping?: No  Fall Risk  06/08/2020  Falls in the past year? 0  Number falls in past yr: 0  Injury with Fall? 0  Follow up Education provided   Providers she sees regularly: N/A  Hx of alcohol dependency: She attends AA weekly.  Depression screen PHQ 2/9 05/10/2020  Decreased Interest 1  Down, Depressed, Hopeless 2  PHQ - 2 Score 3  Altered sleeping 2  Tired, decreased energy 2  Change in appetite 1  Feeling bad or failure about yourself  1  Trouble concentrating 1  Moving slowly or fidgety/restless 1  Suicidal thoughts 0  PHQ-9 Score 11  Difficult doing work/chores Very difficult     Mini-Cog - 06/08/20 1457    Normal clock drawing test? yes    How many words correct? 3           Hearing Screening   125Hz  250Hz  500Hz  1000Hz  2000Hz  3000Hz  4000Hz  6000Hz  8000Hz   Right ear:   Pass Pass Pass  Pass    Left ear:   Pass Pass Pass  Pass      Visual Acuity Screening   Right  eye Left eye Both eyes  Without correction:     With correction: 20/20 20/20 20/20    HLD: She is on Simvastatin 40 mg daily.  Lab Results  Component Value Date   CHOL 187 02/09/2020   HDL 60 02/09/2020   LDLCALC 99 02/09/2020   TRIG 184 (H) 02/09/2020   CHOLHDL 3.1 02/09/2020   Hypothyroidism: She is on Levothyroxine 100 mcg 1.5 tab Mon and Wed, 1 tab rest of the days.  She was last seen on 05/09/20, when she was c/o new onset headache, exacerbated by orgasm. She has not had headache, has avoided trigger factors.  Anxiety and depression: Last visit Effexor XR 75 mg was started because worsening depression. She feels like new medication has helped.  Lexapro dose was decreased from 20 mg to 10 mg. Negative for suicidal thoughts.  Initially she felt hungrier and gained some wt. She feels more motivated. Sleeping 9 hours. Enjoying hobbies.  Review of Systems  Constitutional: Positive for fatigue. Negative for fever.  HENT: Negative for mouth sores, nosebleeds and sore throat.   Eyes: Negative for redness and visual disturbance.  Respiratory: Negative for cough, shortness of breath and wheezing.   Cardiovascular: Negative for chest pain, palpitations and leg swelling.  Gastrointestinal: Negative for abdominal pain, nausea and vomiting.       Negative for changes in bowel habits.  Allergic/Immunologic: Positive for environmental allergies.  Neurological: Negative for syncope  and weakness.  Psychiatric/Behavioral: Negative for confusion and hallucinations.  Rest see pertinent positives and negatives per HPI.  Current Outpatient Medications on File Prior to Visit  Medication Sig Dispense Refill  . BABY ASPIRIN PO Take 81 mg by mouth daily.     . Cholecalciferol (VITAMIN D3 PO) Take 1 tablet by mouth daily.     . fluticasone (FLONASE) 50 MCG/ACT nasal spray Place 1 spray into both nostrils 2 (two) times daily. 16 g 3  . levothyroxine (SYNTHROID) 100 MCG tablet TAKE 1 AND 1/2  TABLETS BY MOUTH MONDAY AND WEDNESDAY AND 1 TABLET BY MOUTH ALL OTHER DAYS 105 tablet 3  . simvastatin (ZOCOR) 40 MG tablet Take 1 tablet (40 mg total) by mouth at bedtime. 90 tablet 2   No current facility-administered medications on file prior to visit.   Past Medical History:  Diagnosis Date  . Alcohol addiction (HCC)   . Allergy   . Chicken pox   . Depression   . Eczema   . GERD (gastroesophageal reflux disease)   . Hyperlipidemia   . Thyroid disease    Hypothyroid  . Urine incontinence    Allergies  Allergen Reactions  . Ampicillin Hives  . Penicillins Hives    Did it involve swelling of the face/tongue/throat, SOB, or low BP? No Did it involve sudden or severe rash/hives, skin peeling, or any reaction on the inside of your mouth or nose? Yes Did you need to seek medical attention at a hospital or doctor's office? No When did it last happen?unk If all above answers are "NO", may proceed with cephalosporin use.     Social History   Socioeconomic History  . Marital status: Divorced    Spouse name: Not on file  . Number of children: 2  . Years of education: Not on file  . Highest education level: Not on file  Occupational History  . Not on file  Tobacco Use  . Smoking status: Never Smoker  . Smokeless tobacco: Never Used  Vaping Use  . Vaping Use: Never used  Substance and Sexual Activity  . Alcohol use: No    Comment: 3 1/2 years sober  . Drug use: No  . Sexual activity: Never    Comment: both previously  Other Topics Concern  . Not on file  Social History Narrative  . Not on file   Social Determinants of Health   Financial Resource Strain:   . Difficulty of Paying Living Expenses: Not on file  Food Insecurity:   . Worried About Programme researcher, broadcasting/film/videounning Out of Food in the Last Year: Not on file  . Ran Out of Food in the Last Year: Not on file  Transportation Needs:   . Lack of Transportation (Medical): Not on file  . Lack of Transportation (Non-Medical): Not  on file  Physical Activity:   . Days of Exercise per Week: Not on file  . Minutes of Exercise per Session: Not on file  Stress:   . Feeling of Stress : Not on file  Social Connections:   . Frequency of Communication with Friends and Family: Not on file  . Frequency of Social Gatherings with Friends and Family: Not on file  . Attends Religious Services: Not on file  . Active Member of Clubs or Organizations: Not on file  . Attends BankerClub or Organization Meetings: Not on file  . Marital Status: Not on file   Vitals:   06/08/20 1422  BP: 124/70  Pulse: 69  Resp: 16  Temp: 97.8 F (36.6 C)  SpO2: 95%   Wt Readings from Last 3 Encounters:  06/08/20 214 lb 8 oz (97.3 kg)  05/09/20 211 lb 8 oz (95.9 kg)  02/09/20 211 lb (95.7 kg)   Body mass index is 34.62 kg/m.  Physical Exam Vitals and nursing note reviewed.  Constitutional:      General: She is not in acute distress.    Appearance: She is well-developed.  HENT:     Head: Normocephalic and atraumatic.  Eyes:     Conjunctiva/sclera: Conjunctivae normal.     Pupils: Pupils are equal, round, and reactive to light.  Cardiovascular:     Rate and Rhythm: Normal rate and regular rhythm.     Heart sounds: No murmur heard.   Pulmonary:     Effort: Pulmonary effort is normal. No respiratory distress.     Breath sounds: Normal breath sounds.  Abdominal:     Palpations: Abdomen is soft. There is no hepatomegaly or mass.     Tenderness: There is no abdominal tenderness.  Lymphadenopathy:     Cervical: No cervical adenopathy.  Skin:    General: Skin is warm.     Findings: No erythema or rash.  Neurological:     Mental Status: She is alert and oriented to person, place, and time.     Gait: Gait normal.    ASSESSMENT AND PLAN:  [name] was seen today for follow-up.  Diagnoses and all orders for this visit:  Medicare annual wellness visit, initial We discussed the importance of staying active, physically and mentally, as  well as the benefits of a healthy/balance diet. Low impact exercise that involve stretching and strengthing are ideal. Vaccines up to date. We discussed preventive screening for the next 5-10 years, summery of recommendations given in AVS. DEXA to be done with next mammogram, order was placed in 02/2020. Fall prevention. Colon cancer screening due in 02/2024.  Advance directives and end of life discussed, she has POA and living will.   Mild episode of recurrent major depressive disorder (HCC) Improved. Instructed to continue weaning of Lexapro. Continue Effexor XR 75 mg daily. Instructed about warning signs.  -     venlafaxine XR (EFFEXOR XR) 75 MG 24 hr capsule; Take 1 capsule (75 mg total) by mouth daily with breakfast. -     escitalopram (LEXAPRO) 10 MG tablet; 1/2 tab daily for 3 weeks then every there day for 2 weeks and every 3rd day for 1 week.   Return in about 8 months (around 02/10/2021) for cpe.  Jessica Harting G. [place], MD  [facility]. [place] [facility].  Jessica Fry , Thank you for taking time to come for your Medicare Wellness Visit. I appreciate your ongoing commitment to your health goals. Please review the following plan we discussed and let me know if I can assist you in the future.   These are the goals we discussed: Goals    . DIET - REDUCE CALORIE INTAKE    . Exercise 3x per week (30 min per time)     10 min of walking at the time 2-3 times daily.       This is a list of the screening recommended for you and due dates:  Health Maintenance  Topic Date Due  . DEXA scan (bone density measurement)  Never done  . Mammogram  01/15/2020  . Flu Shot  02/07/2020  . Tetanus Vaccine  07/15/2020*  . HIV Screening  10/26/2023*  . Colon Cancer Screening  02/09/2024*  . Pneumonia vaccines (2 of 2 - PCV13) 02/08/2021  . Pap Smear  02/09/2023  . COVID-19 Vaccine  Completed  .  Hepatitis C: One time screening is recommended by Center for Disease Control  (CDC) for   adults born from [birth year] through 1967.   Completed  *Topic was postponed. The date shown is not the original due date.    A few things to remember from today's visit:  Mild episode of recurrent major depressive disorder (HCC) - Plan: venlafaxine XR (EFFEXOR XR) 75 MG 24 hr capsule, escitalopram (LEXAPRO) 10 MG tablet  If you need refills please call your pharmacy. Do not use My Chart to request refills or for acute issues that need immediate attention.   Lexapro to be decreased from 10 mg to 5 mg daily for 3 weeks then every 2 days for 2 weeks and then 3rd for a week. No changes in Effexor.  Please be sure medication list is accurate. If a new problem present, please set up appointment sooner than planned today.

## 2020-06-30 ENCOUNTER — Other Ambulatory Visit: Payer: Self-pay | Admitting: Family Medicine

## 2020-06-30 DIAGNOSIS — F330 Major depressive disorder, recurrent, mild: Secondary | ICD-10-CM

## 2020-09-21 ENCOUNTER — Telehealth (INDEPENDENT_AMBULATORY_CARE_PROVIDER_SITE_OTHER): Payer: Medicare HMO | Admitting: Family Medicine

## 2020-09-21 ENCOUNTER — Encounter: Payer: Self-pay | Admitting: Family Medicine

## 2020-09-21 VITALS — Ht 66.0 in

## 2020-09-21 DIAGNOSIS — S29012A Strain of muscle and tendon of back wall of thorax, initial encounter: Secondary | ICD-10-CM | POA: Diagnosis not present

## 2020-09-21 MED ORDER — METHOCARBAMOL 500 MG PO TABS: 500.0000 mg | ORAL_TABLET | Freq: Three times a day (TID) | ORAL | 0 refills | Status: DC | PRN

## 2020-09-21 NOTE — Progress Notes (Signed)
Patient ID: Jessica Fry, female   DOB: [birth year], 65 y.o.   MRN: [id number]   This visit type was conducted due to national recommendations for restrictions regarding the COVID-19 pandemic in an effort to limit this patient's exposure and mitigate transmission in our community.   Virtual Visit via Video Note  I connected with Jessica Fry on 09/21/20 at 11:00 AM EDT by a video enabled telemedicine application and verified that I am speaking with the correct person using two identifiers.  Location patient: home Location provider:work or home office Persons participating in the virtual visit: patient, provider  I discussed the limitations of evaluation and management by telemedicine and the availability of in person appointments. The patient expressed understanding and agreed to proceed.   HPI:  Jessica Fry called with initially concerns for "left shoulder strain.  She states about 5 to 6 days ago she had onset of pain which she thinks followed strain.  She is currently in the middle of moving to another apartment and is having a lot of packing and lifting.  Her pain is mostly medial to the left scapular region and upper back.  Denies any chest pain.  No cervical neck pain.  No dyspnea.  No cough.  No pleuritic pain.  No skin rash.  She feels like she has some muscle tightening in this region.  She is tried some heat and Advil with moderate relief.  Pain is worse with movement and bending and lifting.  She has some worsening with abduction of the left shoulder.  Symptoms seem to be worse in the morning and then improves some with movement.  She has history of alcohol abuse and wishes to avoid any kind of habituating medications.   ROS: See pertinent positives and negatives per HPI.  Past Medical History:  Diagnosis Date  . Alcohol addiction (HCC)   . Allergy   . Chicken pox   . Depression   . Eczema   . GERD (gastroesophageal reflux disease)   . Hyperlipidemia   .  Thyroid disease    Hypothyroid  . Urine incontinence     Past Surgical History:  Procedure Laterality Date  . nose-deviated septum injury  1972    Family History  Problem Relation Age of Onset  . Early death Mother   . Miscarriages / [place] Mother   . Alcohol abuse Father   . Depression Father   . Hearing loss Father   . Stroke Father   . Early death Brother   . Heart attack Maternal Grandmother   . Stroke Paternal Grandmother     SOCIAL HX: Non-smoker   Current Outpatient Medications:  .  BABY ASPIRIN PO, Take 81 mg by mouth daily. , Disp: , Rfl:  .  Cholecalciferol (VITAMIN D3 PO), Take 1 tablet by mouth daily. , Disp: , Rfl:  .  fluticasone (FLONASE) 50 MCG/ACT nasal spray, Place 1 spray into both nostrils 2 (two) times daily., Disp: 16 g, Rfl: 3 .  levothyroxine (SYNTHROID) 100 MCG tablet, TAKE 1 AND 1/2 TABLETS BY MOUTH MONDAY AND WEDNESDAY AND 1 TABLET BY MOUTH ALL OTHER DAYS, Disp: 105 tablet, Rfl: 3 .  methocarbamol (ROBAXIN) 500 MG tablet, Take 1 tablet (500 mg total) by mouth every 8 (eight) hours as needed for muscle spasms., Disp: 20 tablet, Rfl: 0 .  simvastatin (ZOCOR) 40 MG tablet, Take 1 tablet (40 mg total) by mouth at bedtime., Disp: 90 tablet, Rfl: 2 .  venlafaxine XR (EFFEXOR XR) 75 MG 24  hr capsule, Take 1 capsule (75 mg total) by mouth daily with breakfast., Disp: 90 capsule, Rfl: 2 .  escitalopram (LEXAPRO) 10 MG tablet, 1/2 tab daily for 3 weeks then every there day for 2 weeks and every 3rd day for 1 week., Disp: 30 tablet, Rfl: 0  EXAM:  VITALS per patient if applicable:  GENERAL: alert, oriented, appears well and in no acute distress  HEENT: atraumatic, conjunttiva clear, no obvious abnormalities on inspection of external nose and ears  NECK: normal movements of the head and neck  LUNGS: on inspection no signs of respiratory distress, breathing rate appears normal, no obvious gross SOB, gasping or wheezing  CV: no obvious cyanosis  MS:  moves all visible extremities without noticeable abnormality  PSYCH/NEURO: pleasant and cooperative, no obvious depression or anxiety, speech and thought processing grossly intact  ASSESSMENT AND PLAN:  Discussed the following assessment and plan:  Left upper back strain.  This sounds most likely musculoskeletal.  She denies any red flags such as cough, dyspnea, fever, skin rash, chest pain.  Pain worse with movement.  -Continue conservative treatments with heat and Advil. -Cautious short-term use of Robaxin 500 mg nightly for muscle spasm -She will also try some over-the-counter sports creams -Touch base for any persistent or worsening symptoms.     I discussed the assessment and treatment plan with the patient. The patient was provided an opportunity to ask questions and all were answered. The patient agreed with the plan and demonstrated an understanding of the instructions.   The patient was advised to call back or seek an in-person evaluation if the symptoms worsen or if the condition fails to improve as anticipated.     Jessica Peat, MD

## 2020-10-24 ENCOUNTER — Other Ambulatory Visit: Payer: Self-pay

## 2020-10-25 ENCOUNTER — Encounter: Payer: Self-pay | Admitting: Family Medicine

## 2020-10-25 ENCOUNTER — Ambulatory Visit: Payer: Medicare HMO | Admitting: Family Medicine

## 2020-10-25 VITALS — BP 136/82 | HR 71 | Temp 98.9°F | Resp 16 | Ht 66.0 in | Wt 210.0 lb

## 2020-10-25 DIAGNOSIS — Z1211 Encounter for screening for malignant neoplasm of colon: Secondary | ICD-10-CM

## 2020-10-25 DIAGNOSIS — M25542 Pain in joints of left hand: Secondary | ICD-10-CM

## 2020-10-25 DIAGNOSIS — M25541 Pain in joints of right hand: Secondary | ICD-10-CM

## 2020-10-25 DIAGNOSIS — K644 Residual hemorrhoidal skin tags: Secondary | ICD-10-CM

## 2020-10-25 DIAGNOSIS — K5900 Constipation, unspecified: Secondary | ICD-10-CM | POA: Diagnosis not present

## 2020-10-25 MED ORDER — HYDROCORTISONE ACETATE 25 MG RE SUPP: 25.0000 mg | Freq: Two times a day (BID) | RECTAL | 0 refills | Status: AC | PRN

## 2020-10-25 NOTE — Patient Instructions (Addendum)
A few things to remember from today's visit:   Constipation, unspecified constipation type  External hemorrhoids  Arthralgia of both hands  Colon cancer screening - Plan: Cologuard  If you need refills please call your pharmacy. Do not use My Chart to request refills or for acute issues that need immediate attention.   Miralax daily as needed. Benefiber 1 tsp 2 times daily. Adequate hydration. Avoid straining or long toilet. Sitz bath 1-2 times per day.  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  Please be sure medication list is accurate. If a new problem present, please set up appointment sooner than planned today.

## 2020-10-25 NOTE — Progress Notes (Signed)
Chief Complaint  Patient presents with  . Joint Pain  . Constipation   HPI: Jessica Fry is a 65 y.o. female, who is here today with above complaint. Bilateral thumb joint intermittent sharp pain, L>R for the past 2-3 weeks. Intensity 6/10.  Right-sided symptoms have improved. She does not remember injuries but 2-3 weeks ago she was packing and moving boxes, moved to a new place. Pain is exacerbated by certain activities. It is better today. She has applied Arnica cream and has taken Advil. No edema or erythema. She has not noted numbness,tingling,weakness,or cyanosis.  She was seen recently because left shoulder pain, 09/21/20,it has improved.  Weeks of intermittent LUQ abdominal pain, usually at night, "gas pain." Exacerbated by increasing greens intake. Alleviated by passing gas. Hx of constipation and problem with hemorrhoids. Bulky stools. + Dyschezia. Occasional hematochezia, small amount of blood on tissue, has not had it recently. Last bowel movement yesterday, small. Lab Results  Component Value Date   TSH 1.41 02/09/2020   No associated nausea vomiting,urinary symptoms,or abnormal wt loss.  Review of Systems  Constitutional: Negative for activity change, appetite change, fatigue and fever.  HENT: Negative for mouth sores, nosebleeds and sore throat.   Respiratory: Negative for cough, shortness of breath and wheezing.   Cardiovascular: Negative for chest pain, palpitations and leg swelling.  Endocrine: Negative for cold intolerance and heat intolerance.  Genitourinary: Negative for decreased urine volume, dysuria and hematuria.  Skin: Negative for pallor and rash.  Rest see pertinent positives and negatives per HPI.  Current Outpatient Medications on File Prior to Visit  Medication Sig Dispense Refill  . BABY ASPIRIN PO Take 81 mg by mouth daily.     . Cholecalciferol (VITAMIN D3 PO) Take 1 tablet by mouth daily.     Marland Kitchen escitalopram (LEXAPRO)  10 MG tablet 1/2 tab daily for 3 weeks then every there day for 2 weeks and every 3rd day for 1 week. 30 tablet 0  . fluticasone (FLONASE) 50 MCG/ACT nasal spray Place 1 spray into both nostrils 2 (two) times daily. 16 g 3  . levothyroxine (SYNTHROID) 100 MCG tablet TAKE 1 AND 1/2 TABLETS BY MOUTH MONDAY AND WEDNESDAY AND 1 TABLET BY MOUTH ALL OTHER DAYS 105 tablet 3  . methocarbamol (ROBAXIN) 500 MG tablet Take 1 tablet (500 mg total) by mouth every 8 (eight) hours as needed for muscle spasms. 20 tablet 0  . simvastatin (ZOCOR) 40 MG tablet Take 1 tablet (40 mg total) by mouth at bedtime. 90 tablet 2  . venlafaxine XR (EFFEXOR XR) 75 MG 24 hr capsule Take 1 capsule (75 mg total) by mouth daily with breakfast. 90 capsule 2   No current facility-administered medications on file prior to visit.   Past Medical History:  Diagnosis Date  . Alcohol addiction (HCC)   . Allergy   . Chicken pox   . Depression   . Eczema   . GERD (gastroesophageal reflux disease)   . Hyperlipidemia   . Thyroid disease    Hypothyroid  . Urine incontinence    Allergies  Allergen Reactions  . Ampicillin Hives  . Penicillins Hives    Did it involve swelling of the face/tongue/throat, SOB, or low BP? No Did it involve sudden or severe rash/hives, skin peeling, or any reaction on the inside of your mouth or nose? Yes Did you need to seek medical attention at a hospital or doctor's office? No When did it last happen?unk If all above answers  are "NO", may proceed with cephalosporin use.     Social History   Socioeconomic History  . Marital status: Divorced    Spouse name: Not on file  . Number of children: 2  . Years of education: Not on file  . Highest education level: Not on file  Occupational History  . Not on file  Tobacco Use  . Smoking status: Never Smoker  . Smokeless tobacco: Never Used  Vaping Use  . Vaping Use: Never used  Substance and Sexual Activity  . Alcohol use: No    Comment: 3  1/2 years sober  . Drug use: No  . Sexual activity: Never    Comment: both previously  Other Topics Concern  . Not on file  Social History Narrative  . Not on file   Social Determinants of Health   Financial Resource Strain: Not on file  Food Insecurity: Not on file  Transportation Needs: Not on file  Physical Activity: Not on file  Stress: Not on file  Social Connections: Not on file    Vitals:   10/25/20 1052  BP: 136/82  Pulse: 71  Resp: 16  Temp: 98.9 F (37.2 C)  SpO2: 97%   Body mass index is 33.89 kg/m.  Physical Exam Vitals and nursing note reviewed.  Constitutional:      General: She is not in acute distress.    Appearance: She is well-developed.  HENT:     Head: Normocephalic and atraumatic.  Eyes:     Conjunctiva/sclera: Conjunctivae normal.  Cardiovascular:     Rate and Rhythm: Normal rate and regular rhythm.     Pulses:          Radial pulses are 2+ on the right side and 2+ on the left side.     Heart sounds: No murmur heard.   Pulmonary:     Effort: Pulmonary effort is normal. No respiratory distress.     Breath sounds: Normal breath sounds.  Abdominal:     Palpations: Abdomen is soft. There is no hepatomegaly or mass.     Tenderness: There is no abdominal tenderness.  Genitourinary:    Comments: Prefers to hold on rectal exam. Musculoskeletal:     Right hand: Normal capillary refill. Normal pulse.     Left hand: Normal capillary refill. Normal pulse.     Comments: Left hand:Pain upon palpation of thumb MCP joint.  There is no tenderness upon palpation of radial styloid, no edema or erythema appreciated, no limitation of wrist ROM. Pain is not elicited on radial styloid with Finkelstein maneuver bilateral.    Lymphadenopathy:     Cervical: No cervical adenopathy.  Skin:    General: Skin is warm.     Findings: No erythema or rash.  Neurological:     Mental Status: She is alert and oriented to person, place, and time.     Cranial  Nerves: No cranial nerve deficit.     Gait: Gait normal.   ASSESSMENT AND PLAN:  Jessica Fry was seen today for joint pain and constipation.  Diagnoses and all orders for this visit:  Arthralgia of both hands We discussed possible etiologies. Most likely OA aggravated by repetitive manual activities. I do not think imaging is needed at this time. Continue Advil 200 mg 2-3 times as needed.  Constipation, unspecified constipation type Adequate fiber and fluid intake. Miralax daily prn. Benefiber 1 tsp bid. She is not interested in colonoscopy. Instructed about warning signs.  External hemorrhoids Similar problem in the  past, so she is certain it is hemorrhoids. Sitz bath bid as needed. Avoid straining or long toilet time. Anusol to use daily prn.  -     hydrocortisone (ANUSOL-HC) 25 MG suppository; Place 1 suppository (25 mg total) rectally 2 (two) times daily as needed for up to 7 days for hemorrhoids.  Colon cancer screening -     Cologuard  Return if symptoms worsen or fail to improve, for Keep next appt.   Zaidyn Claire G. [place], MD  [facility]. [place] office.   A few things to remember from today's visit:   Constipation, unspecified constipation type  External hemorrhoids  Arthralgia of both hands  Colon cancer screening - Plan: Cologuard  If you need refills please call your pharmacy. Do not use My Chart to request refills or for acute issues that need immediate attention.   Miralax daily as needed. Benefiber 1 tsp 2 times daily. Adequate hydration. Avoid straining or long toilet. Sitz bath 1-2 times per day.  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  Please be sure medication list is accurate. If a new problem present, please set up appointment sooner than planned today.

## 2020-11-16 ENCOUNTER — Other Ambulatory Visit: Payer: Self-pay | Admitting: Family Medicine

## 2020-11-16 DIAGNOSIS — E039 Hypothyroidism, unspecified: Secondary | ICD-10-CM

## 2020-12-22 ENCOUNTER — Other Ambulatory Visit: Payer: Self-pay | Admitting: Family Medicine

## 2020-12-22 ENCOUNTER — Other Ambulatory Visit: Payer: Self-pay

## 2020-12-22 DIAGNOSIS — F330 Major depressive disorder, recurrent, mild: Secondary | ICD-10-CM

## 2021-01-05 ENCOUNTER — Emergency Department (HOSPITAL_BASED_OUTPATIENT_CLINIC_OR_DEPARTMENT_OTHER): Payer: Medicare HMO

## 2021-01-05 ENCOUNTER — Encounter (HOSPITAL_BASED_OUTPATIENT_CLINIC_OR_DEPARTMENT_OTHER): Payer: Self-pay | Admitting: Obstetrics and Gynecology

## 2021-01-05 ENCOUNTER — Other Ambulatory Visit: Payer: Self-pay

## 2021-01-05 ENCOUNTER — Emergency Department (HOSPITAL_BASED_OUTPATIENT_CLINIC_OR_DEPARTMENT_OTHER)
Admission: EM | Admit: 2021-01-05 | Discharge: 2021-01-05 | Disposition: A | Discharge status: Home / Self Care | Payer: Medicare HMO | Attending: Emergency Medicine | Admitting: Emergency Medicine

## 2021-01-05 DIAGNOSIS — W01198A Fall on same level from slipping, tripping and stumbling with subsequent striking against other object, initial encounter: Secondary | ICD-10-CM | POA: Insufficient documentation

## 2021-01-05 DIAGNOSIS — S0006XA Insect bite (nonvenomous) of scalp, initial encounter: Secondary | ICD-10-CM

## 2021-01-05 DIAGNOSIS — W57XXXA Bitten or stung by nonvenomous insect and other nonvenomous arthropods, initial encounter: Secondary | ICD-10-CM | POA: Diagnosis not present

## 2021-01-05 DIAGNOSIS — E039 Hypothyroidism, unspecified: Secondary | ICD-10-CM | POA: Diagnosis not present

## 2021-01-05 DIAGNOSIS — Z79899 Other long term (current) drug therapy: Secondary | ICD-10-CM | POA: Insufficient documentation

## 2021-01-05 DIAGNOSIS — S0990XA Unspecified injury of head, initial encounter: Secondary | ICD-10-CM | POA: Diagnosis present

## 2021-01-05 DIAGNOSIS — H538 Other visual disturbances: Secondary | ICD-10-CM | POA: Diagnosis not present

## 2021-01-05 LAB — CBC
HCT: 38.9 % (ref 36.0–46.0)
Hemoglobin: 13.3 g/dL (ref 12.0–15.0)
MCH: 29.4 pg (ref 26.0–34.0)
MCHC: 34.2 g/dL (ref 30.0–36.0)
MCV: 85.9 fL (ref 80.0–100.0)
Platelets: 325 10*3/uL (ref 150–400)
RBC: 4.53 MIL/uL (ref 3.87–5.11)
RDW: 12.9 % (ref 11.5–15.5)
WBC: 6.3 10*3/uL (ref 4.0–10.5)
nRBC: 0 % (ref 0.0–0.2)

## 2021-01-05 LAB — COMPREHENSIVE METABOLIC PANEL
ALT: 17 U/L (ref 0–44)
AST: 20 U/L (ref 15–41)
Albumin: 3.8 g/dL (ref 3.5–5.0)
Alkaline Phosphatase: 70 U/L (ref 38–126)
Anion gap: 4 — ABNORMAL LOW (ref 5–15)
BUN: 16 mg/dL (ref 8–23)
CO2: 26 mmol/L (ref 22–32)
Calcium: 9.2 mg/dL (ref 8.9–10.3)
Chloride: 108 mmol/L (ref 98–111)
Creatinine, Ser: 0.82 mg/dL (ref 0.44–1.00)
GFR, Estimated: >60 mL/min (ref >60)
Glucose, Bld: 100 mg/dL — ABNORMAL HIGH (ref 70–99)
Potassium: 3.8 mmol/L (ref 3.5–5.1)
Sodium: 138 mmol/L (ref 135–145)
Total Bilirubin: 0.4 mg/dL (ref 0.3–1.2)
Total Protein: 6.5 g/dL (ref 6.5–8.1)

## 2021-01-05 MED ORDER — DOXYCYCLINE HYCLATE 100 MG PO CAPS: 100.0000 mg | ORAL_CAPSULE | Freq: Two times a day (BID) | ORAL | 0 refills | Status: AC

## 2021-01-05 MED ORDER — TETRACAINE HCL 0.5 % OP SOLN
2.0000 [drp] | Freq: Once | OPHTHALMIC | Status: AC
  Administered 2021-01-05: 2 [drp] via OPHTHALMIC
  Filled 2021-01-05: qty 4

## 2021-01-05 NOTE — Discharge Instructions (Addendum)
  Your work-up today was reassuring.  As we discussed I need you to go see Dr. Sherryll Burger, he said that he could see you before 3 PM today or you can go see him at 7:45 AM tomorrow morning.  His address is listed above.  I also prescribed you 14 days of doxycycline for your tick and prophylaxis for Lyme disease.  If you have any new or concerning symptoms expect to the emergency department. Please return to the Emergency Department if you experience any worsening of your condition.  Thank you for allowing [place] to be a part of your care. Please speak to your pharmacist about any new medications prescribed today in regards to side effects or interactions with other medications.

## 2021-01-05 NOTE — ED Triage Notes (Signed)
Patient reports to the ER for Blurred Vision. Patient states she fell x11 days ago and has since had blurred vision that has not improved. Patient reports following the fall she did not Pass out or have any nausea or emesis. Patient denies headache. Neuro exam unremarkable.

## 2021-01-05 NOTE — ED Notes (Signed)
Patient transported to CT 

## 2021-01-05 NOTE — ED Provider Notes (Signed)
[facility] EMERGENCY DEPARTMENT Provider Note   CSN: [id number] Arrival date & time: 01/05/21  [id number]     History Chief Complaint  Patient presents with   Blurred Vision    Jessica Fry is a 65 y.o. female with pertinent past medical history of hyperlipidemia that presents the emerge department today for blurry vision.  Patient states that blurry vision started 11 days ago after she fell in her garden.  Patient states that it was a mild fall, tripped over her dog and landed on her side, states that she did hit her head on the grass.  Denies losing consciousness and states that she was able to ambulate and felt normal after this. No eye trauma, fell backward.  NPatient states that she started driving she noticed that she was having trouble reading faraway objects.  States that this is progressed throughout last of 11 days.  Patient states that it has not worsened, however just progressed.  States that she called her ophthalmologist, however they wanted her to be evaluated in the ED first.  Patient denies any eye pain, vision loss, photophobia, dizziness, headache, nausea, vomiting, paresthesias, facial pain, eye discharge or eye swelling.  Patient states that she normally has floaters, this is unchanged.  Patient does admit to wearing glasses.  Patient describes blurry vision that not as brightness around letters, no difference between black and motor colors.  Denies double vision.  Denies any peripheral versus central difference.  Denies any visual field deficit.  Patient states that both eyes are similar.  HPI     Past Medical History:  Diagnosis Date   Alcohol addiction (HCC)    Allergy    Chicken pox    Depression    Eczema    GERD (gastroesophageal reflux disease)    Hyperlipidemia    Thyroid disease    Hypothyroid   Urine incontinence     Patient Active Problem List   Diagnosis Date Noted   Allergic rhinitis 10/26/2019   History of alcohol dependence (HCC)  02/03/2019   Hyperlipidemia 08/26/2017   Major depression, recurrent (HCC) 08/26/2017   Primary hypothyroidism 08/26/2017   Psoriasis 08/26/2017    Past Surgical History:  Procedure Laterality Date   nose-deviated septum injury  1972     OB History     Gravida      Para      Term      Preterm      AB      Living  2      SAB      IAB      Ectopic      Multiple      Live Births              Family History  Problem Relation Age of Onset   Early death Mother    Miscarriages / [place] Mother    Alcohol abuse Father    Depression Father    Hearing loss Father    Stroke Father    Early death Brother    Heart attack Maternal Grandmother    Stroke Paternal Grandmother     Social History   Tobacco Use   Smoking status: Never   Smokeless tobacco: Never  Vaping Use   Vaping Use: Never used  Substance Use Topics   Alcohol use: No    Comment: 7 years sober   Drug use: No    Home Medications Prior to Admission medications   Medication Sig Start Date  End Date Taking? Authorizing Provider  doxycycline (VIBRAMYCIN) 100 MG capsule Take 1 capsule (100 mg total) by mouth 2 (two) times daily for 14 days. 01/05/21 01/19/21 Yes Zoa Dowty, PA-C  BABY ASPIRIN PO Take 81 mg by mouth daily.     [provider]  Cholecalciferol (VITAMIN D3 PO) Take 1 tablet by mouth daily.     [provider]  fluticasone (FLONASE) 50 MCG/ACT nasal spray Place 1 spray into both nostrils 2 (two) times daily. 10/26/19   [place], Betty G, MD  levothyroxine (SYNTHROID) 100 MCG tablet TAKE 1 AND 1/2 TABLETS BY MOUTH MONDAY AND WEDNESDAY AND 1 TABLET BY MOUTH ALL OTHER DAYS 11/16/20   [place], Betty G, MD  methocarbamol (ROBAXIN) 500 MG tablet Take 1 tablet (500 mg total) by mouth every 8 (eight) hours as needed for muscle spasms. 09/21/20   Burchette, Elberta Fortis, MD  simvastatin (ZOCOR) 40 MG tablet TAKE 1 TABLET BY MOUTH EVERYDAY AT BEDTIME 12/22/20   [place], Betty G, MD   venlafaxine XR (EFFEXOR-XR) 75 MG 24 hr capsule TAKE 1 CAPSULE BY MOUTH DAILY WITH BREAKFAST. 12/22/20   [place], Betty G, MD    Allergies    Ampicillin and Penicillins  Review of Systems   Review of Systems  Constitutional:  Negative for chills, diaphoresis, fatigue and fever.  HENT:  Negative for congestion, sore throat and trouble swallowing.   Eyes:  Positive for visual disturbance. Negative for photophobia, pain, discharge, redness and itching.  Respiratory:  Negative for cough, shortness of breath and wheezing.   Cardiovascular:  Negative for chest pain, palpitations and leg swelling.  Gastrointestinal:  Negative for abdominal distention, abdominal pain, diarrhea, nausea and vomiting.  Genitourinary:  Negative for difficulty urinating.  Musculoskeletal:  Negative for back pain, neck pain and neck stiffness.  Skin:  Negative for pallor.  Neurological:  Negative for dizziness, facial asymmetry, speech difficulty, weakness, light-headedness, numbness and headaches.  Psychiatric/Behavioral:  Negative for confusion.    Physical Exam Updated Vital Signs BP (!) 152/80 (BP Location: Right Arm)   Pulse (!) 47   Temp 98.2 F (36.8 C)   Resp 16   Ht 5\' 6"  (1.676 m)   SpO2 98%   BMI 33.89 kg/m   Physical Exam Constitutional:      General: She is not in acute distress.    Appearance: Normal appearance. She is not ill-appearing, toxic-appearing or diaphoretic.  HENT:     Head:     Comments: Pt does have tick on scalp, was able to remove it     Mouth/Throat:     Mouth: Mucous membranes are moist.     Pharynx: Oropharynx is clear.  Eyes:     General: Lids are normal. Lids are everted, no foreign bodies appreciated. No visual field deficit or scleral icterus.    Intraocular pressure: Right eye pressure is 50 mmHg. Left eye pressure is 50 mmHg. Measurements were taken using a handheld tonometer.    Extraocular Movements: Extraocular movements intact.     Right eye: Normal  extraocular motion and no nystagmus.     Left eye: Normal extraocular motion and no nystagmus.     Conjunctiva/sclera: Conjunctivae normal.     Pupils: Pupils are equal, round, and reactive to light.     Right eye: Pupil is reactive and not sluggish.     Left eye: Pupil is reactive and not sluggish.     Funduscopic exam:    Right eye: No papilledema.  Left eye: No papilledema.     Comments: Visual fields intact, vision grossly intact peripherally and centrally with me.  EOMs normal, no nystagmus.  Conjunctive eye are noninjected, no exudate.  Lids everted no foreign bodies appreciated. No concerns for entrapment, no facial nerve palsy.   Cardiovascular:     Rate and Rhythm: Normal rate and regular rhythm.     Pulses: Normal pulses.     Heart sounds: Normal heart sounds.  Pulmonary:     Effort: Pulmonary effort is normal. No respiratory distress.     Breath sounds: Normal breath sounds. No stridor. No wheezing, rhonchi or rales.  Chest:     Chest wall: No tenderness.  Abdominal:     General: Abdomen is flat. There is no distension.     Palpations: Abdomen is soft.     Tenderness: There is no abdominal tenderness. There is no guarding or rebound.  Musculoskeletal:        General: No swelling or tenderness. Normal range of motion.     Cervical back: Normal range of motion and neck supple. No rigidity.     Right lower leg: No edema.     Left lower leg: No edema.  Skin:    General: Skin is warm and dry.     Capillary Refill: Capillary refill takes less than 2 seconds.     Coloration: Skin is not pale.  Neurological:     General: No focal deficit present.     Mental Status: She is alert and oriented to person, place, and time.     Comments: Alert. Clear speech. No facial droop. CNIII-XII grossly intact. Bilateral upper and lower extremities' sensation grossly intact. 5/5 symmetric strength with grip strength and with plantar and dorsi flexion bilaterally. Patellar DTRs are 2+ and  symmetric . Normal finger to nose bilaterally. Negative pronator drift. Negative Romberg sign. Gait is steady and intact    Psychiatric:        Mood and Affect: Mood normal.        Behavior: Behavior normal.    ED Results / Procedures / Treatments   Labs (all labs ordered are listed, but only abnormal results are displayed) Labs Reviewed  COMPREHENSIVE METABOLIC PANEL - Abnormal; Notable for the following components:      Result Value   Glucose, Bld 100 (*)    Anion gap 4 (*)    All other components within normal limits  CBC  LYME DISEASE DNA BY PCR(BORRELIA BURG)    EKG None  Radiology CT Head Wo Contrast  Result Date: 01/05/2021 CLINICAL DATA:  65 year old female with fall 11 days ago. Persistent blurred vision. EXAM: CT HEAD WITHOUT CONTRAST TECHNIQUE: Contiguous axial images were obtained from the base of the skull through the vertex without intravenous contrast. COMPARISON:  Brain MRI 05/29/2020. FINDINGS: Brain: Cerebral volume is within normal limits for age. No midline shift, ventriculomegaly, mass effect, evidence of mass lesion, intracranial hemorrhage or evidence of cortically based acute infarction. Gray-white matter differentiation is within normal limits throughout the brain. Normal noncontrast CT appearance of the suprasellar cistern, cavernous sinuses. Vascular: No suspicious intracranial vascular hyperdensity. Skull: Negative. Sinuses/Orbits: Visualized paranasal sinuses and mastoids are clear. Tympanic cavities are clear. Other: Visualized scalp soft tissues are within normal limits. Visualized orbit soft tissues are within normal limits. IMPRESSION: Normal for age non contrast CT appearance of the brain. No recent traumatic injury identified. Electronically Signed   By: Odessa Fleming M.D.   On: 01/05/2021 11:42  Procedures Procedures   Medications Ordered in ED Medications  tetracaine (PONTOCAINE) 0.5 % ophthalmic solution 2 drop (2 drops Both Eyes Given 01/05/21 1037)     ED Course  I have reviewed the triage vital signs and the nursing notes.  Pertinent labs & imaging results that were available during my care of the patient were reviewed by me and considered in my medical decision making (see chart for details).    MDM Rules/Calculators/A&P                          Patient presents to the emergency department today for blurry vision after fall 11 days ago.  Normal neuro exam, blurry vision described as brightness around letters.  Denies eye pain, vision loss, double vision.  Visual acuity worse on the left.  Patient states that she is normally able to see somewhat far away, however unable to tell.  Bilateral intraocular pressures around 50.  Normal funduscope exam.  Patient does also have tick on head that we are able to remove, patient states that she also found 1 earlier on herself. Consider lyme for blurry vision. No other lyme symptoms.  CT head negative.  Work-up today unremarkable.  Repeat normal neuro exam.  Did consult Dr. Sherryll Burger, ophthalmology who states that he will see her today before 3.  Did discuss this with patient, she states that she will go there.  Also treat patient for prophylaxis for Lyme disease.  Strict return precautions given, patient be discharged at this time.  Doubt need for further emergent work up at this time. I explained the diagnosis and have given explicit precautions to return to the ER including for any other new or worsening symptoms. The patient understands and accepts the medical plan as it's been dictated and I have answered their questions. Discharge instructions concerning home care and prescriptions have been given. The patient is STABLE and is discharged to home in good condition.  I discussed this case with my attending physician who cosigned this note including patient's presenting symptoms, physical exam, and planned diagnostics and interventions. Attending physician stated agreement with plan or made changes to plan  which were implemented.   Attending physician assessed patient at bedside.  Final Clinical Impression(s) / ED Diagnoses Final diagnoses:  Blurry vision, bilateral  Tick bite of scalp, initial encounter    Rx / DC Orders ED Discharge Orders          Ordered    B. Burgdorfi Antibodies  Status:  Canceled        01/05/21 1116    doxycycline (VIBRAMYCIN) 100 MG capsule  2 times daily        01/05/21 1120             Farrel Gordon, PA-C 01/05/21 1222    Pollyann Savoy, MD 01/05/21 [phone]

## 2021-01-07 LAB — LYME DISEASE SEROLOGY W/REFLEX: Lyme Total Antibody EIA: NEGATIVE

## 2021-02-15 ENCOUNTER — Encounter: Payer: Self-pay | Admitting: Family Medicine

## 2021-02-15 ENCOUNTER — Telehealth (INDEPENDENT_AMBULATORY_CARE_PROVIDER_SITE_OTHER): Payer: Medicare HMO | Admitting: Family Medicine

## 2021-02-15 VITALS — Ht 66.0 in

## 2021-02-15 DIAGNOSIS — L304 Erythema intertrigo: Secondary | ICD-10-CM

## 2021-02-15 DIAGNOSIS — F330 Major depressive disorder, recurrent, mild: Secondary | ICD-10-CM | POA: Diagnosis not present

## 2021-02-15 MED ORDER — NYSTATIN 100000 UNIT/GM EX POWD: 1.0000 "application " | Freq: Two times a day (BID) | CUTANEOUS | 3 refills | Status: DC | PRN

## 2021-02-15 MED ORDER — CLOTRIMAZOLE-BETAMETHASONE 1-0.05 % EX CREA: 1.0000 "application " | TOPICAL_CREAM | Freq: Every day | CUTANEOUS | 0 refills | Status: DC | PRN

## 2021-02-15 NOTE — Addendum Note (Signed)
Addended by: [place], Dotty Gonzalo G on: 02/15/2021 04:06 PM   Modules accepted: Level of Service

## 2021-02-15 NOTE — Progress Notes (Signed)
MyChart Video Visit  Virtual Visit via Video Note   This visit type was conducted due to national recommendations for restrictions regarding the COVID-19 Pandemic (e.g. social distancing) in an effort to limit this patient's exposure and mitigate transmission in our community. This patient is at least at moderate risk for complications without adequate follow up. This format is felt to be most appropriate for this patient at this time. Physical exam was limited by quality of the video and audio technology used for the visit.   Patient location: Home Provider location: Office  I discussed the limitations of evaluation and management by telemedicine and the availability of in person appointments. The patient expressed understanding and agreed to proceed.  Patient: Jessica Fry   DOB: 01/03/1956   65 y.o. Female  MRN: [id number] Visit Date: 02/15/2021  Today's healthcare provider: Miryah Ralls [place], MD   Chief Complaint  Patient presents with   Follow-up    From urgent care, used OTC Lamisil, fungal infection under each breast, red, painful & stinging.    Rash This is a new problem. The current episode started in the past 7 days. The problem has been gradually improving since onset. The rash is characterized by itchiness, redness and pain. It is unknown if there was an exposure to a precipitant. Pertinent negatives include no congestion, diarrhea, facial edema, nail changes, rhinorrhea, sore throat or vomiting. The treatment provided moderate relief. Her past medical history is significant for allergies. There is no history of asthma, eczema or varicella.  Jessica Fry is a 65 y.o.female with hx of seasonal allergies, hypothyroidism, and depression with above complaint. She was evaluated in urgent care on 02/11/21 after 4 days of daily pruritic and erythematous rash on the right breast, bilateral.  HPI     Follow-up    Additional comments: From urgent care, used OTC Lamisil,  fungal infection under each breast, red, painful & stinging.       Last edited by Kathreen Devoid, CMA on 02/15/2021  2:54 PM.     She has been walking her dog outdoors daily and she was not wearing a bra because it was too hot. Exacerbated by heat and sweating. Negative for new detergent, soft, a skin problem, insect bites, or outdoor plants exposure.  She has not identified alleviating factors.  Problem has improved with Lamisil topical twice daily. Negative for fever, chills, unusual fatigue, changes in appetite, cough, wheezing, shortness of breath, abdominal pain, nausea, vomiting, tingling, numbness, or bruising.  Depression currently on Effexor XR 75 mg daily. She recently adopted a new puppy, which has helped her with having a daily routine and therefore helping with depression. She is walking daily.     Patient Active Problem List   Diagnosis Date Noted   Allergic rhinitis 10/26/2019   History of alcohol dependence (HCC) 02/03/2019   Hyperlipidemia 08/26/2017   Major depression, recurrent (HCC) 08/26/2017   Primary hypothyroidism 08/26/2017   Psoriasis 08/26/2017   Past Medical History:  Diagnosis Date   Alcohol addiction (HCC)    Allergy    Chicken pox    Depression    Eczema    GERD (gastroesophageal reflux disease)    Hyperlipidemia    Thyroid disease    Hypothyroid   Urine incontinence    Social History   Tobacco Use   Smoking status: Never   Smokeless tobacco: Never  Vaping Use   Vaping Use: Never used  Substance Use Topics   Alcohol use: No  Comment: 7 years sober   Drug use: No   Allergies  Allergen Reactions   Ampicillin Hives   Penicillins Hives    Did it involve swelling of the face/tongue/throat, SOB, or low BP? No Did it involve sudden or severe rash/hives, skin peeling, or any reaction on the inside of your mouth or nose? Yes Did you need to seek medical attention at a hospital or doctor's office? No When did it last happen?unk        If all above answers are "NO", may proceed with cephalosporin use.    Medications: Outpatient Medications Prior to Visit  Medication Sig   BABY ASPIRIN PO Take 81 mg by mouth daily.    Cholecalciferol (VITAMIN D3 PO) Take 1 tablet by mouth daily.    fluticasone (FLONASE) 50 MCG/ACT nasal spray Place 1 spray into both nostrils 2 (two) times daily.   levothyroxine (SYNTHROID) 100 MCG tablet TAKE 1 AND 1/2 TABLETS BY MOUTH MONDAY AND WEDNESDAY AND 1 TABLET BY MOUTH ALL OTHER DAYS   simvastatin (ZOCOR) 40 MG tablet TAKE 1 TABLET BY MOUTH EVERYDAY AT BEDTIME   venlafaxine XR (EFFEXOR-XR) 75 MG 24 hr capsule TAKE 1 CAPSULE BY MOUTH DAILY WITH BREAKFAST.   [DISCONTINUED] methocarbamol (ROBAXIN) 500 MG tablet Take 1 tablet (500 mg total) by mouth every 8 (eight) hours as needed for muscle spasms.   No facility-administered medications prior to visit.   Review of Systems  HENT:  Negative for congestion, mouth sores, rhinorrhea and sore throat.   Cardiovascular:  Negative for chest pain and palpitations.  Gastrointestinal:  Negative for diarrhea and vomiting.  Musculoskeletal:  Negative for gait problem and myalgias.  Skin:  Positive for rash. Negative for nail changes and wound.  Hematological:  Negative for adenopathy.  See pertinent positives and negatives per HPI.  Objective    Ht 5\' 6"  (1.676 m)   BMI 33.89 kg/m   GENERAL: alert, oriented, appears well and in no acute distress  HEENT: atraumatic, conjunctiva clear, no obvious abnormalities on inspection of external nose and ears  LUNGS: on inspection no signs of respiratory distress, breathing rate appears normal, no obvious gross SOB, gasping or wheezing  CV: no obvious cyanosis  MS: moves all visible extremities without noticeable abnormality  SKIN: Hyperpigmentation area under breast, bilateral I do not appreciated edema or erythema.   PSYCH/NEURO: pleasant and cooperative, no obvious depression or anxiety, speech and  thought processing grossly intact   Assessment & Plan    1. Intertrigo Improved. We discussed diagnosis, prognosis, and treatment options. Instructed to keep area clean and dry,a bra with good support may help. Nystatin powder twice daily as needed to keep area dry. Antibacterial soap to prevent bacterial infection. Lotrisone, small amount, on affected area daily as needed. Monitor for new symptoms. Instructed about warning signs.  - clotrimazole-betamethasone (LOTRISONE) cream; Apply 1 application topically daily as needed.  Dispense: 45 g; Refill: 0 - nystatin (MYCOSTATIN/NYSTOP) powder; Apply 1 application topically 2 (two) times daily as needed.  Dispense: 60 g; Refill: 3  Mild episode of recurrent major depressive disorder (HCC) Reporting feeling much better since she adopted a new poppy.  Daily exercise and having a routine has helped with symptoms. Continue Effexor XR 75 mg daily.  Return if symptoms worsen or fail to improve, for Keep next appt.    I discussed the assessment and treatment plan with the patient. The patient was provided an opportunity to ask questions and all were answered. The patient agreed  with the plan and demonstrated an understanding of the instructions.  Tavi Gaughran [place], MD [facility] [place] [phone] (phone) [phone] (fax)  [facility]

## 2021-03-22 ENCOUNTER — Other Ambulatory Visit: Payer: Self-pay | Admitting: Family Medicine

## 2021-05-15 ENCOUNTER — Telehealth: Payer: Self-pay

## 2021-05-15 NOTE — Telephone Encounter (Signed)
Noted  

## 2021-05-15 NOTE — Telephone Encounter (Signed)
Dorene Sorrow a nurse from [facility] called to inform PCP that patient is due for screening of mammogram and colonoscopy

## 2021-05-24 ENCOUNTER — Telehealth: Payer: Self-pay

## 2021-05-24 NOTE — Telephone Encounter (Signed)
Last OV for preventative care 06/08/20.  LVM requesting pt to return call to schedule annual exam with PCP. MyChart message sent.

## 2021-05-30 ENCOUNTER — Telehealth: Payer: Self-pay | Admitting: Family Medicine

## 2021-05-30 NOTE — Telephone Encounter (Signed)
LVM requesting pt return call to schedule CPE.  Pt noted to have ready mychart message on 05/24/21.

## 2021-05-30 NOTE — Telephone Encounter (Signed)
Left message for patient to call back and schedule Medicare Annual Wellness Visit (AWV) either virtually or in office. Left  my jabber number [phone]   Last WTM 06/08/20 please schedule at anytime with [facility] Nurse Health Advisor 1 or 2   This should be a 45 minute visit.  

## 2021-06-21 ENCOUNTER — Other Ambulatory Visit: Payer: Self-pay | Admitting: Family Medicine

## 2021-06-21 DIAGNOSIS — F330 Major depressive disorder, recurrent, mild: Secondary | ICD-10-CM

## 2021-06-30 ENCOUNTER — Telehealth: Payer: Self-pay | Admitting: Family Medicine

## 2021-06-30 NOTE — Telephone Encounter (Signed)
Spoke with patient to schedule Medicare Annual Wellness Visit (AWV) either virtually or in office.   She stated insurance came to home in nov and didn't want to schedule at this time   call back in the summer of 2023  Last AWV 06/08/20 ; please schedule at anytime with [facility] Nurse Health Advisor 1 or 2   This should be a 45 minute visit.

## 2021-07-19 ENCOUNTER — Telehealth: Payer: Self-pay | Admitting: Family Medicine

## 2021-07-19 NOTE — Telephone Encounter (Signed)
Patient calling in with respiratory symptoms: Shortness of breath, chest pain, palpitations or other red words send to Triage  Does the patient have a fever over 100, cough, congestion, sore throat, runny nose, lost of taste/smell (please list symptoms that patient has)? Chest congestion  What date did symptoms start?over a month ago (If over 5 days ago, pt may be scheduled for in person visit)  Have you tested for Covid in the last 5 days? No   If yes, was it positive []  OR negative [] ? If positive in the last 5 days, please schedule virtual visit now. If negative, schedule for an in person OV with the next available provider if PCP has no openings. Please also let patient know they will be tested again (follow the script below)  "you will have to arrive prior to your appt time to be Covid tested. Please park in back of office at the [facility] & call [phone] to let the staff know you have arrived. A staff member will meet you at your car to do a rapid covid test. Once the test has resulted you will be notified by phone of your results to determine if appt will remain an in person visit or be converted to a virtual/phone visit. If you arrive less than before your appt time, your visit will be automatically converted to virtual & any recommended testing will happen AFTER the visit." Appt with dr [phone] on 07-24-2021  THINGS TO REMEMBER  If no availability for virtual visit in office,  please schedule another [place] office  If no availability at another [place] office, please instruct patient that they can schedule an evisit or virtual visit through their mychart account. Visits up to 8pm  patients can be seen in office 5 days after positive COVID test

## 2021-07-21 NOTE — Progress Notes (Deleted)
ACUTE VISIT No chief complaint on file.  HPI: [name] is a 66 y.o. female, who is here today complaining of *** HPI  Review of Systems Rest see pertinent positives and negatives per HPI.  Current Outpatient Medications on File Prior to Visit  Medication Sig Dispense Refill   BABY ASPIRIN PO Take 81 mg by mouth daily.      Cholecalciferol (VITAMIN D3 PO) Take 1 tablet by mouth daily.      clotrimazole-betamethasone (LOTRISONE) cream Apply 1 application topically daily as needed. 45 g 0   fluticasone (FLONASE) 50 MCG/ACT nasal spray Place 1 spray into both nostrils 2 (two) times daily. 16 g 3   levothyroxine (SYNTHROID) 100 MCG tablet TAKE 1 AND 1/2 TABLETS BY MOUTH MONDAY AND WEDNESDAY AND 1 TABLET BY MOUTH ALL OTHER DAYS 45 tablet 4   nystatin (MYCOSTATIN/NYSTOP) powder Apply 1 application topically 2 (two) times daily as needed. 60 g 3   simvastatin (ZOCOR) 40 MG tablet TAKE 1 TABLET BY MOUTH EVERYDAY AT BEDTIME 90 tablet 2   venlafaxine XR (EFFEXOR-XR) 75 MG 24 hr capsule TAKE 1 CAPSULE BY MOUTH DAILY WITH BREAKFAST. 90 capsule 0   No current facility-administered medications on file prior to visit.     Past Medical History:  Diagnosis Date   Alcohol addiction (HCC)    Allergy    Chicken pox    Depression    Eczema    GERD (gastroesophageal reflux disease)    Hyperlipidemia    Thyroid disease    Hypothyroid   Urine incontinence    Allergies  Allergen Reactions   Ampicillin Hives   Penicillins Hives    Did it involve swelling of the face/tongue/throat, SOB, or low BP? No Did it involve sudden or severe rash/hives, skin peeling, or any reaction on the inside of your mouth or nose? Yes Did you need to seek medical attention at a hospital or doctor's office? No When did it last happen?unk       If all above answers are NO, may proceed with cephalosporin use.     Social History   Socioeconomic History   Marital status:  Divorced    Spouse name: Not on file   Number of children: 2   Years of education: Not on file   Highest education level: Doctorate  Occupational History   Not on file  Tobacco Use   Smoking status: Never   Smokeless tobacco: Never  Vaping Use   Vaping Use: Never used  Substance and Sexual Activity   Alcohol use: No    Comment: 7 years sober   Drug use: No   Sexual activity: Never    Comment: both previously  Other Topics Concern   Not on file  Social History Narrative   Not on file   Social Determinants of Health   Financial Resource Strain: Low Risk    Difficulty of Paying Living Expenses: Not hard at all  Food Insecurity: No Food Insecurity   Worried About Programme researcher, broadcasting/film/video in the Last Year: Never true   Ran Out of Food in the Last Year: Never true  Transportation Needs: No Transportation Needs   Lack of Transportation (Medical): No   Lack of Transportation (Non-Medical): No  Physical Activity: Insufficiently Active   Days of Exercise per Week: 2 days   Minutes of Exercise per Session: 30 min  Stress: No Stress Concern Present   Feeling of Stress : Not at all  Social Connections:  Moderately Integrated   Frequency of Communication with Friends and Family: More than three times a week   Frequency of Social Gatherings with Friends and Family: Twice a week   Attends Religious Services: More than 4 times per year   Active Member of [employer] or Organizations: Yes   Attends Engineer, structural: More than 4 times per year   Marital Status: Divorced    There were no vitals filed for this visit. There is no height or weight on file to calculate BMI.  Physical Exam  ASSESSMENT AND PLAN:  There are no diagnoses linked to this encounter.   No follow-ups on file.   Betty G. [place], MD  [facility]. [place] office.  Discharge Instructions   None

## 2021-07-24 ENCOUNTER — Telehealth (INDEPENDENT_AMBULATORY_CARE_PROVIDER_SITE_OTHER): Payer: Medicare HMO | Admitting: Family Medicine

## 2021-07-24 ENCOUNTER — Encounter: Payer: Self-pay | Admitting: Family Medicine

## 2021-07-24 VITALS — Ht 66.0 in

## 2021-07-24 DIAGNOSIS — E039 Hypothyroidism, unspecified: Secondary | ICD-10-CM

## 2021-07-24 DIAGNOSIS — J320 Chronic maxillary sinusitis: Secondary | ICD-10-CM | POA: Diagnosis not present

## 2021-07-24 DIAGNOSIS — E785 Hyperlipidemia, unspecified: Secondary | ICD-10-CM

## 2021-07-24 DIAGNOSIS — R059 Cough, unspecified: Secondary | ICD-10-CM | POA: Diagnosis not present

## 2021-07-24 DIAGNOSIS — S8991XA Unspecified injury of right lower leg, initial encounter: Secondary | ICD-10-CM

## 2021-07-24 MED ORDER — DOXYCYCLINE HYCLATE 100 MG PO TABS: 100.0000 mg | ORAL_TABLET | Freq: Two times a day (BID) | ORAL | 0 refills | Status: AC

## 2021-07-24 NOTE — Progress Notes (Signed)
Virtual Visit via Video Note I connected with Jessica Fry on 07/24/21 by a video enabled telemedicine application and verified that I am speaking with the correct person using two identifiers.  Location patient: home Location provider:work office Persons participating in the virtual visit: patient, provider  I discussed the limitations of evaluation and management by telemedicine and the availability of in person appointments. The patient expressed understanding and agreed to proceed.  Chief Complaint  Patient presents with   chest congestion    Over a month   HPI: [name] is a 66 yo female with hx of hypothyroidism,HLD,alcohol dependency on remission,depression,and allergies c/o mildly productive cough with clearish sputum for about a month. Mild nasal congestion, postnasal drainage, and facial pain.  Upper teeth aching pain. One of her tooth filling fell and tooth broke, she is following with dentist.  She has not taking any antibiotic, pending root canal procedure 08/07/2021. + Fatigue, which started around the same time her cough started and has been intermittent since then.  The first 2 days she slept most of the day.  Negative for fever, chills, sore throat, dyspnea, wheezing, CP, palpitation, heartburn,abdominal pain, nausea, vomiting, urinary symptoms, or skin rash.  She wonders if fatigue is caused by thyroid disease. She has had fatigue before, intermittently for a while. Depression and anxiety stable, she is on Effexor XR 75 mg daily.  Hypothyroidism: She is on Levothyroxine 100 mcg 1 AND 1/2 TABLETS BY MOUTH MONDAY AND WEDNESDAY AND 1 TABLET BY MOUTH ALL OTHER DAYS. She has not noted abnormal wt changes, palpitations,changes in bowel habits, cold/heat intolerance,or tremor.  Lab Results  Component Value Date   TSH 1.41 02/09/2020   HLD on Simvastatin 40 mg daily. Tolerating medication well.  Lab Results  Component Value Date   CHOL 187 02/09/2020    HDL 60 02/09/2020   LDLCALC 99 02/09/2020   TRIG 184 (H) 02/09/2020   CHOLHDL 3.1 02/09/2020   She was supposed to be here in the office today but changes visit to virtual visit because right knee pain. Yesterday while she was at the dog park, several dogs ran towards her right knee, hit anterior aspect and "knocked her down." Denies head trauma. She was able to get up, 10 min later she started with pain above patella and posterior + lateral aspect, mild edema, no erythema or ecchymosis. Pain is exacerbated by movement, usually after prolonged sitting/rest. Mild limitation of flexion. She has placed ice a couple times. She is using a walker.  ROS: See pertinent positives and negatives per HPI.  Past Medical History:  Diagnosis Date   Alcohol addiction (HCC)    Allergy    Chicken pox    Depression    Eczema    GERD (gastroesophageal reflux disease)    Hyperlipidemia    Thyroid disease    Hypothyroid   Urine incontinence     Past Surgical History:  Procedure Laterality Date   nose-deviated septum injury  1972    Family History  Problem Relation Age of Onset   Early death Mother    Miscarriages / [place] Mother    Alcohol abuse Father    Depression Father    Hearing loss Father    Stroke Father    Early death Brother    Heart attack Maternal Grandmother    Stroke Paternal Grandmother     Social History   Socioeconomic History   Marital status: Divorced    Spouse name: Not on file  Number of children: 2   Years of education: Not on file   Highest education level: Doctorate  Occupational History   Not on file  Tobacco Use   Smoking status: Never   Smokeless tobacco: Never  Vaping Use   Vaping Use: Never used  Substance and Sexual Activity   Alcohol use: No    Comment: 7 years sober   Drug use: No   Sexual activity: Never    Comment: both previously  Other Topics Concern   Not on file  Social History Narrative   Not on file   Social Determinants  of Health   Financial Resource Strain: Low Risk    Difficulty of Paying Living Expenses: Not hard at all  Food Insecurity: No Food Insecurity   Worried About Programme researcher, broadcasting/film/video in the Last Year: Never true   Ran Out of Food in the Last Year: Never true  Transportation Needs: No Transportation Needs   Lack of Transportation (Medical): No   Lack of Transportation (Non-Medical): No  Physical Activity: Insufficiently Active   Days of Exercise per Week: 2 days   Minutes of Exercise per Session: 30 min  Stress: No Stress Concern Present   Feeling of Stress : Not at all  Social Connections: Moderately Integrated   Frequency of Communication with Friends and Family: More than three times a week   Frequency of Social Gatherings with Friends and Family: Twice a week   Attends Religious Services: More than 4 times per year   Active Member of [employer] or Organizations: Yes   Attends Engineer, structural: More than 4 times per year   Marital Status: Divorced  Catering manager Violence: Not on file   Current Outpatient Medications:    BABY ASPIRIN PO, Take 81 mg by mouth daily. , Disp: , Rfl:    Cholecalciferol (VITAMIN D3 PO), Take 1 tablet by mouth daily. , Disp: , Rfl:    clotrimazole-betamethasone (LOTRISONE) cream, Apply 1 application topically daily as needed., Disp: 45 g, Rfl: 0   fluticasone (FLONASE) 50 MCG/ACT nasal spray, Place 1 spray into both nostrils 2 (two) times daily., Disp: 16 g, Rfl: 3   levothyroxine (SYNTHROID) 100 MCG tablet, TAKE 1 AND 1/2 TABLETS BY MOUTH MONDAY AND WEDNESDAY AND 1 TABLET BY MOUTH ALL OTHER DAYS, Disp: 45 tablet, Rfl: 4   nystatin (MYCOSTATIN/NYSTOP) powder, Apply 1 application topically 2 (two) times daily as needed., Disp: 60 g, Rfl: 3   simvastatin (ZOCOR) 40 MG tablet, TAKE 1 TABLET BY MOUTH EVERYDAY AT BEDTIME, Disp: 90 tablet, Rfl: 2   venlafaxine XR (EFFEXOR-XR) 75 MG 24 hr capsule, TAKE 1 CAPSULE BY MOUTH DAILY WITH BREAKFAST., Disp: 90  capsule, Rfl: 0  EXAM:  VITALS per patient if applicable:Ht 5\' 6"  (1.676 m)    BMI 33.89 kg/m   GENERAL: alert, oriented, appears well and in no acute distress  HEENT: atraumatic, conjunctiva clear, no obvious abnormalities on inspection of external nose and ears Tenderness upon pressing maxillary sinus, bilateral.  NECK: normal movements of the head and neck  LUNGS: on inspection no signs of respiratory distress, breathing rate appears normal, no obvious gross SOB, gasping or wheezing. Occasional non productive cough during visit.  CV: no obvious cyanosis  MS: moves all visible extremities without noticeable abnormality. Right knee:I do not appreciate knee edema, erythema,or ecchymosis. Flexion and extension elicit pain, mild limitation of flexion.  PSYCH/NEURO: pleasant and cooperative, no obvious depression or anxiety, speech and thought processing grossly intact  ASSESSMENT AND PLAN:  Discussed the following assessment and plan:  Maxillary sinusitis, unspecified chronicity - Plan: doxycycline (VIBRA-TABS) 100 MG tablet We discussed differential Dx's, allergies can be a contributing factor in which case abx will not help. We discussed some side effects of abx treatment. Recommend staking a daily probiotic, Align 1 caps daily.  Cough, unspecified type We discussed possible etiologies. Hx does not suggest a serious process. She prefers to hold on CXR. Instructed about warning signs.  Injury of right knee, initial encounter Fell yesterday. She prefers to hold on imaging for now. Ice x 48-72 hours, elevation. Activity as tolerated. Fall precautions discussed.  Primary hypothyroidism Continue levothyroxine 100 mcg 1-1/2 tablet Monday and Wednesday and 1 tablet the rest of the days. We will arrange future lab, TSH.  Hyperlipidemia Continue simvastatin 40 mg daily and low-fat diet. She will arrange for fasting lab appointment a week before her CPE, late 08/2021.  We  discussed possible serious and likely etiologies, options for evaluation and workup, limitations of telemedicine visit vs in person visit, treatment, treatment risks and precautions. The patient was advised to call back or seek an in-person evaluation if the symptoms worsen or if the condition fails to improve as anticipated. I discussed the assessment and treatment plan with the patient. The patient was provided an opportunity to ask questions and all were answered. The patient agreed with the plan and demonstrated an understanding of the instructions.  Return in about 6 weeks (around 09/01/2021) for cpe, labs a week before..  Boston Catarino G. [place], MD  [facility]. [place] office.

## 2021-07-24 NOTE — Assessment & Plan Note (Signed)
Continue levothyroxine 100 mcg 1-1/2 tablet Monday and Wednesday and 1 tablet the rest of the days. We will arrange future lab, TSH.

## 2021-07-24 NOTE — Assessment & Plan Note (Signed)
Continue simvastatin 40 mg daily and low-fat diet. She will arrange for fasting lab appointment a week before her CPE, late 08/2021.

## 2021-07-28 ENCOUNTER — Other Ambulatory Visit: Payer: Self-pay | Admitting: Family Medicine

## 2021-07-28 DIAGNOSIS — E039 Hypothyroidism, unspecified: Secondary | ICD-10-CM

## 2021-07-31 ENCOUNTER — Encounter: Payer: Self-pay | Admitting: Family Medicine

## 2021-07-31 DIAGNOSIS — S8991XA Unspecified injury of right lower leg, initial encounter: Secondary | ICD-10-CM

## 2021-08-01 ENCOUNTER — Other Ambulatory Visit (HOSPITAL_BASED_OUTPATIENT_CLINIC_OR_DEPARTMENT_OTHER): Payer: Self-pay | Admitting: Orthopaedic Surgery

## 2021-08-01 DIAGNOSIS — M25561 Pain in right knee: Secondary | ICD-10-CM

## 2021-08-01 DIAGNOSIS — M1712 Unilateral primary osteoarthritis, left knee: Secondary | ICD-10-CM

## 2021-08-02 ENCOUNTER — Ambulatory Visit (HOSPITAL_BASED_OUTPATIENT_CLINIC_OR_DEPARTMENT_OTHER)
Admission: RE | Admit: 2021-08-02 | Discharge: 2021-08-02 | Disposition: A | Discharge status: Home / Self Care | Payer: Medicare HMO | Source: Ambulatory Visit | Attending: Orthopaedic Surgery | Admitting: Orthopaedic Surgery

## 2021-08-02 ENCOUNTER — Ambulatory Visit (HOSPITAL_BASED_OUTPATIENT_CLINIC_OR_DEPARTMENT_OTHER): Payer: Medicare HMO | Admitting: Orthopaedic Surgery

## 2021-08-02 ENCOUNTER — Other Ambulatory Visit: Payer: Self-pay

## 2021-08-02 DIAGNOSIS — S82141A Displaced bicondylar fracture of right tibia, initial encounter for closed fracture: Secondary | ICD-10-CM | POA: Diagnosis not present

## 2021-08-02 DIAGNOSIS — M25561 Pain in right knee: Secondary | ICD-10-CM | POA: Diagnosis present

## 2021-08-02 NOTE — Progress Notes (Signed)
Chief Complaint: Right knee pain     History of Present Illness:    Jessica Fry is a 66 y.o. female presents for right knee pain for 10 days.  She previously had a heavy dog run into the knee.  Dog park.  She said since that time she had swelling and stiffness in the leg.  Overall she is slowly feeling better and the swelling is gone down.  She is using an Ace wrap for compression.  She is using a ski pole for ambulation.  She is taking ambulation which also helps.  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    Surgical History:   None  PMH/PSH/Family History/Social History/Meds/Allergies:    Past Medical History:  Diagnosis Date   Alcohol addiction (HCC)    Allergy    Chicken pox    Depression    Eczema    GERD (gastroesophageal reflux disease)    Hyperlipidemia    Thyroid disease    Hypothyroid   Urine incontinence    Past Surgical History:  Procedure Laterality Date   nose-deviated septum injury  1972   Social History   Socioeconomic History   Marital status: Divorced    Spouse name: Not on file   Number of children: 2   Years of education: Not on file   Highest education level: Doctorate  Occupational History   Not on file  Tobacco Use   Smoking status: Never   Smokeless tobacco: Never  Vaping Use   Vaping Use: Never used  Substance and Sexual Activity   Alcohol use: No    Comment: 7 years sober   Drug use: No   Sexual activity: Never    Comment: both previously  Other Topics Concern   Not on file  Social History Narrative   Not on file   Social Determinants of Health   Financial Resource Strain: Low Risk    Difficulty of Paying Living Expenses: Not hard at all  Food Insecurity: No Food Insecurity   Worried About Programme researcher, broadcasting/film/video in the Last Year: Never true   Ran Out of Food in the Last Year: Never true  Transportation Needs: No Transportation Needs   Lack of Transportation (Medical):  No   Lack of Transportation (Non-Medical): No  Physical Activity: Insufficiently Active   Days of Exercise per Week: 2 days   Minutes of Exercise per Session: 30 min  Stress: No Stress Concern Present   Feeling of Stress : Not at all  Social Connections: Moderately Integrated   Frequency of Communication with Friends and Family: More than three times a week   Frequency of Social Gatherings with Friends and Family: Twice a week   Attends Religious Services: More than 4 times per year   Active Member of [employer] or Organizations: Yes   Attends Engineer, structural: More than 4 times per year   Marital Status: Divorced   Family History  Problem Relation Age of Onset   Early death Mother    Miscarriages / [place] Mother    Alcohol abuse Father    Depression Father    Hearing loss Father    Stroke Father    Early death Brother    Heart attack Maternal Grandmother    Stroke Paternal Grandmother  Allergies  Allergen Reactions   Penicillins Hives    Did it involve swelling of the face/tongue/throat, SOB, or low BP? No Did it involve sudden or severe rash/hives, skin peeling, or any reaction on the inside of your mouth or nose? Yes Did you need to seek medical attention at a hospital or doctor's office? No When did it last happen?unk       If all above answers are NO, may proceed with cephalosporin use.    Current Outpatient Medications  Medication Sig Dispense Refill   BABY ASPIRIN PO Take 81 mg by mouth daily.      Cholecalciferol (VITAMIN D3 PO) Take 1 tablet by mouth daily.      clotrimazole-betamethasone (LOTRISONE) cream Apply 1 application topically daily as needed. 45 g 0   doxycycline (VIBRA-TABS) 100 MG tablet Take 1 tablet (100 mg total) by mouth 2 (two) times daily for 10 days. 20 tablet 0   fluticasone (FLONASE) 50 MCG/ACT nasal spray Place 1 spray into both nostrils 2 (two) times daily. 16 g 3   levothyroxine (SYNTHROID) 100 MCG tablet TAKE 1 AND 1/2  TABLETS BY MOUTH MONDAY AND WEDNESDAY AND 1 TABLET BY MOUTH ALL OTHER DAYS 135 tablet 1   nystatin (MYCOSTATIN/NYSTOP) powder Apply 1 application topically 2 (two) times daily as needed. 60 g 3   simvastatin (ZOCOR) 40 MG tablet TAKE 1 TABLET BY MOUTH EVERYDAY AT BEDTIME 90 tablet 2   venlafaxine XR (EFFEXOR-XR) 75 MG 24 hr capsule TAKE 1 CAPSULE BY MOUTH DAILY WITH BREAKFAST. 90 capsule 0   No current facility-administered medications for this visit.   No results found.  Review of Systems:   A ROS was performed including pertinent positives and negatives as documented in the HPI.  Physical Exam :   Constitutional: NAD and appears stated age Neurological: Alert and oriented Psych: Appropriate affect and cooperative There were no vitals taken for this visit.   Comprehensive Musculoskeletal Exam:      Musculoskeletal Exam  Gait Normal  Alignment Normal   Right Left  Inspection Normal Normal  Palpation    Tenderness Lateral joint line none   Crepitus None None  Effusion Trace None  Range of Motion    Extension 0 0  Flexion 135 135  Strength    Extension 5/5 5/5  Flexion 5/5 5/5  Ligament Exam     Generalized Laxity No No  Lachman Negative Negative   Pivot Shift Negative Negative  Anterior Drawer Negative Negative  Valgus at 0 Negative Negative  Valgus at 20 Negative Negative  Varus at 0 0 0  Varus at 20   0 0  Posterior Drawer at 90 0 0  Vascular/Lymphatic Exam    Edema None None  Venous Stasis Changes No No  Distal Circulation Normal Normal  Neurologic    Light Touch Sensation Intact Intact  Special Tests:      Imaging:   Xray (4 views right knee): Lateral tibial plateau fracture with some depression but no split    I personally reviewed and interpreted the radiographs.   Assessment:   C 66-year-old female with a right lateral tibial plateau fracture after dog ran into the knee.  At this time I have advised that given that there is not a split and this  is involving the anterior aspect of the plateau of these typically tend to do well with activity as tolerated and motion as tolerated.  She will continue to advance her activity.  I would also  like to provide a knee stabilizing brace to give her added support.  This would also provide an element of compression.  She will see me back in 4 weeks at which point we will check her clinically and likely return her to full activity  Plan :    -Return to clinic in 4 weeks     I personally saw and evaluated the patient, and participated in the management and treatment plan.  Huel Cote, MD Attending Physician, Orthopedic Surgery  This document was dictated using Dragon voice recognition software. A reasonable attempt at proof reading has been made to minimize errors.

## 2021-08-30 ENCOUNTER — Ambulatory Visit (HOSPITAL_BASED_OUTPATIENT_CLINIC_OR_DEPARTMENT_OTHER): Payer: Medicare HMO | Admitting: Orthopaedic Surgery

## 2021-08-30 ENCOUNTER — Other Ambulatory Visit: Payer: Self-pay

## 2021-08-30 DIAGNOSIS — S82141A Displaced bicondylar fracture of right tibia, initial encounter for closed fracture: Secondary | ICD-10-CM

## 2021-08-30 NOTE — Progress Notes (Signed)
Chief Complaint: Right knee pain     History of Present Illness:   08/30/2021: Presents for follow-up of her right knee.  Overall she is feeling much better with no pain at today's visit.  She has been walking without any assistive devices  Jessica Fry is a 66 y.o. female presents for right knee pain for 10 days.  She previously had a heavy dog run into the knee.  Dog park.  She said since that time she had swelling and stiffness in the leg.  Overall she is slowly feeling better and the swelling is gone down.  She is using an Ace wrap for compression.  She is using a ski pole for ambulation.  She is taking ambulation which also helps.  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    Surgical History:   None  PMH/PSH/Family History/Social History/Meds/Allergies:    Past Medical History:  Diagnosis Date   Alcohol addiction (HCC)    Allergy    Chicken pox    Depression    Eczema    GERD (gastroesophageal reflux disease)    Hyperlipidemia    Thyroid disease    Hypothyroid   Urine incontinence    Past Surgical History:  Procedure Laterality Date   nose-deviated septum injury  1972   Social History   Socioeconomic History   Marital status: Divorced    Spouse name: Not on file   Number of children: 2   Years of education: Not on file   Highest education level: Doctorate  Occupational History   Not on file  Tobacco Use   Smoking status: Never   Smokeless tobacco: Never  Vaping Use   Vaping Use: Never used  Substance and Sexual Activity   Alcohol use: No    Comment: 7 years sober   Drug use: No   Sexual activity: Never    Comment: both previously  Other Topics Concern   Not on file  Social History Narrative   Not on file   Social Determinants of Health   Financial Resource Strain: Low Risk    Difficulty of Paying Living Expenses: Not hard at all  Food Insecurity: No Food Insecurity   Worried About Community education officer in the Last Year: Never true   Ran Out of Food in the Last Year: Never true  Transportation Needs: No Transportation Needs   Lack of Transportation (Medical): No   Lack of Transportation (Non-Medical): No  Physical Activity: Insufficiently Active   Days of Exercise per Week: 2 days   Minutes of Exercise per Session: 30 min  Stress: No Stress Concern Present   Feeling of Stress : Not at all  Social Connections: Moderately Integrated   Frequency of Communication with Friends and Family: More than three times a week   Frequency of Social Gatherings with Friends and Family: Twice a week   Attends Religious Services: More than 4 times per year   Active Member of [employer] or Organizations: Yes   Attends Engineer, structural: More than 4 times per year   Marital Status: Divorced   Family History  Problem Relation Age of Onset   Early death Mother    Miscarriages / [place] Mother    Alcohol abuse Father    Depression Father  Hearing loss Father    Stroke Father    Early death Brother    Heart attack Maternal Grandmother    Stroke Paternal Grandmother    Allergies  Allergen Reactions   Penicillins Hives    Did it involve swelling of the face/tongue/throat, SOB, or low BP? No Did it involve sudden or severe rash/hives, skin peeling, or any reaction on the inside of your mouth or nose? Yes Did you need to seek medical attention at a hospital or doctor's office? No When did it last happen?unk       If all above answers are NO, may proceed with cephalosporin use.    Current Outpatient Medications  Medication Sig Dispense Refill   BABY ASPIRIN PO Take 81 mg by mouth daily.      Cholecalciferol (VITAMIN D3 PO) Take 1 tablet by mouth daily.      clotrimazole-betamethasone (LOTRISONE) cream Apply 1 application topically daily as needed. 45 g 0   fluticasone (FLONASE) 50 MCG/ACT nasal spray Place 1 spray into both nostrils 2 (two) times daily. 16 g 3    levothyroxine (SYNTHROID) 100 MCG tablet TAKE 1 AND 1/2 TABLETS BY MOUTH MONDAY AND WEDNESDAY AND 1 TABLET BY MOUTH ALL OTHER DAYS 135 tablet 1   nystatin (MYCOSTATIN/NYSTOP) powder Apply 1 application topically 2 (two) times daily as needed. 60 g 3   simvastatin (ZOCOR) 40 MG tablet TAKE 1 TABLET BY MOUTH EVERYDAY AT BEDTIME 90 tablet 2   venlafaxine XR (EFFEXOR-XR) 75 MG 24 hr capsule TAKE 1 CAPSULE BY MOUTH DAILY WITH BREAKFAST. 90 capsule 0   No current facility-administered medications for this visit.   No results found.  Review of Systems:   A ROS was performed including pertinent positives and negatives as documented in the HPI.  Physical Exam :   Constitutional: NAD and appears stated age Neurological: Alert and oriented Psych: Appropriate affect and cooperative There were no vitals taken for this visit.   Comprehensive Musculoskeletal Exam:      Musculoskeletal Exam  Gait Normal  Alignment Normal   Right Left  Inspection Normal Normal  Palpation    Tenderness Lateral joint line none   Crepitus None None  Effusion Trace None  Range of Motion    Extension 0 0  Flexion 135 135  Strength    Extension 5/5 5/5  Flexion 5/5 5/5  Ligament Exam     Generalized Laxity No No  Lachman Negative Negative   Pivot Shift Negative Negative  Anterior Drawer Negative Negative  Valgus at 0 Negative Negative  Valgus at 20 Negative Negative  Varus at 0 0 0  Varus at 20   0 0  Posterior Drawer at 90 0 0  Vascular/Lymphatic Exam    Edema None None  Venous Stasis Changes No No  Distal Circulation Normal Normal  Neurologic    Light Touch Sensation Intact Intact  Special Tests:      Imaging:   Xray (4 views right knee): Lateral tibial plateau fracture with some depression but no split    I personally reviewed and interpreted the radiographs.   Assessment:   66 year old female with a right lateral tibial plateau fracture after dog ran into the knee.  At today's visit she  has no pain and is overall feeling much better.  I would like her to be activity as tolerated at this time.  She will follow-up with me as needed  Plan :    -Return to clinic as needed  I personally saw and evaluated the patient, and participated in the management and treatment plan.  Jessica Mulders, MD Attending Physician, Orthopedic Surgery  This document was dictated using Dragon voice recognition software. A reasonable attempt at proof reading has been made to minimize errors.

## 2021-09-21 ENCOUNTER — Other Ambulatory Visit: Payer: Self-pay | Admitting: Family Medicine

## 2021-10-06 ENCOUNTER — Other Ambulatory Visit: Payer: Self-pay

## 2021-10-06 DIAGNOSIS — F330 Major depressive disorder, recurrent, mild: Secondary | ICD-10-CM

## 2021-10-06 MED ORDER — VENLAFAXINE HCL ER 75 MG PO CP24: 75.0000 mg | ORAL_CAPSULE | Freq: Every day | ORAL | 1 refills | Status: DC

## 2021-11-22 ENCOUNTER — Other Ambulatory Visit: Payer: Self-pay | Admitting: Family Medicine

## 2021-11-22 DIAGNOSIS — E039 Hypothyroidism, unspecified: Secondary | ICD-10-CM

## 2022-01-03 ENCOUNTER — Telehealth: Payer: Self-pay | Admitting: Family Medicine

## 2022-01-03 NOTE — Telephone Encounter (Signed)
Pt returned my call  She stated she is traveling and will call me back

## 2022-01-03 NOTE — Telephone Encounter (Signed)
Left message for patient to call back and schedule Medicare Annual Wellness Visit (AWV) either virtually or in office. Left  my Jessica Fry number [phone]   Last WTM 06/08/20 please schedule at anytime with [facility] Nurse Health Advisor 1 or 2   This should be a 45 minute visit.

## 2022-01-08 ENCOUNTER — Ambulatory Visit (INDEPENDENT_AMBULATORY_CARE_PROVIDER_SITE_OTHER): Payer: Medicare HMO

## 2022-01-08 ENCOUNTER — Ambulatory Visit (INDEPENDENT_AMBULATORY_CARE_PROVIDER_SITE_OTHER): Payer: Medicare HMO | Admitting: Orthopaedic Surgery

## 2022-01-08 DIAGNOSIS — M170 Bilateral primary osteoarthritis of knee: Secondary | ICD-10-CM | POA: Diagnosis not present

## 2022-01-08 DIAGNOSIS — M25561 Pain in right knee: Secondary | ICD-10-CM

## 2022-01-08 DIAGNOSIS — G8929 Other chronic pain: Secondary | ICD-10-CM | POA: Diagnosis not present

## 2022-01-08 DIAGNOSIS — M25562 Pain in left knee: Secondary | ICD-10-CM | POA: Diagnosis not present

## 2022-01-08 MED ORDER — DICLOFENAC SODIUM 1 % EX GEL: 4.0000 g | Freq: Four times a day (QID) | CUTANEOUS | 3 refills | Status: DC

## 2022-01-08 NOTE — Progress Notes (Signed)
Chief Complaint: Bilateral knee pain     History of Present Illness:   01/08/2022: Presents today for bilateral knee pain.  She states that she has been experiencing swelling which she will occasionally experience a soreness in the back of the knee.  She will occasionally feel popping in the knee and feels like it is bone-on-bone medially.  She is taking ibuprofen orally for this this helps somewhat.  Here today for further assessment  Jessica Fry is a 66 y.o. female presents for right knee pain for 10 days.  She previously had a heavy dog run into the knee.  Dog park.  She said since that time she had swelling and stiffness in the leg.  Overall she is slowly feeling better and the swelling is gone down.  She is using an Ace wrap for compression.  She is using a ski pole for ambulation.  She is taking ambulation which also helps.  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    Surgical History:   None  PMH/PSH/Family History/Social History/Meds/Allergies:    Past Medical History:  Diagnosis Date   Alcohol addiction (HCC)    Allergy    Chicken pox    Depression    Eczema    GERD (gastroesophageal reflux disease)    Hyperlipidemia    Thyroid disease    Hypothyroid   Urine incontinence    Past Surgical History:  Procedure Laterality Date   nose-deviated septum injury  1972   Social History   Socioeconomic History   Marital status: Divorced    Spouse name: Not on file   Number of children: 2   Years of education: Not on file   Highest education level: Doctorate  Occupational History   Not on file  Tobacco Use   Smoking status: Never   Smokeless tobacco: Never  Vaping Use   Vaping Use: Never used  Substance and Sexual Activity   Alcohol use: No    Comment: 7 years sober   Drug use: No   Sexual activity: Never    Comment: both previously  Other Topics Concern   Not on file  Social History Narrative   Not on  file   Social Determinants of Health   Financial Resource Strain: Low Risk  (07/20/2021)   Overall Financial Resource Strain (CARDIA)    Difficulty of Paying Living Expenses: Not hard at all  Food Insecurity: No Food Insecurity (07/20/2021)   Hunger Vital Sign    Worried About Running Out of Food in the Last Year: Never true    Ran Out of Food in the Last Year: Never true  Transportation Needs: No Transportation Needs (07/20/2021)   PRAPARE - Administrator, Civil Service (Medical): No    Lack of Transportation (Non-Medical): No  Physical Activity: Insufficiently Active (07/20/2021)   Exercise Vital Sign    Days of Exercise per Week: 2 days    Minutes of Exercise per Session: 30 min  Stress: No Stress Concern Present (07/20/2021)   [employer] of Occupational Health - Occupational Stress Questionnaire    Feeling of Stress : Not at all  Social Connections: Moderately Integrated (07/20/2021)   Social Connection and Isolation Panel [NHANES]    Frequency of Communication with Friends and Family: More than three  times a week    Frequency of Social Gatherings with Friends and Family: Twice a week    Attends Religious Services: More than 4 times per year    Active Member of [employer] or Organizations: Yes    Attends Music therapist: More than 4 times per year    Marital Status: Divorced   Family History  Problem Relation Age of Onset   Early death Mother    Miscarriages / [place] Mother    Alcohol abuse Father    Depression Father    Hearing loss Father    Stroke Father    Early death Brother    Heart attack Maternal Grandmother    Stroke Paternal Grandmother    Allergies  Allergen Reactions   Penicillins Hives    Did it involve swelling of the face/tongue/throat, SOB, or low BP? No Did it involve sudden or severe rash/hives, skin peeling, or any reaction on the inside of your mouth or nose? Yes Did you need to seek medical attention at a hospital or  doctor's office? No When did it last happen?unk       If all above answers are "NO", may proceed with cephalosporin use.    Current Outpatient Medications  Medication Sig Dispense Refill   diclofenac Sodium (VOLTAREN) 1 % GEL Apply 4 g topically 4 (four) times daily. 50 g 3   BABY ASPIRIN PO Take 81 mg by mouth daily.      Cholecalciferol (VITAMIN D3 PO) Take 1 tablet by mouth daily.      clotrimazole-betamethasone (LOTRISONE) cream Apply 1 application topically daily as needed. 45 g 0   levothyroxine (SYNTHROID) 100 MCG tablet TAKE 1 AND 1/2 TABLETS BY MOUTH MONDAY AND WEDNESDAY AND 1 TABLET BY MOUTH ALL OTHER DAYS 135 tablet 1   nystatin (MYCOSTATIN/NYSTOP) powder Apply 1 application topically 2 (two) times daily as needed. 60 g 3   simvastatin (ZOCOR) 40 MG tablet TAKE 1 TABLET BY MOUTH EVERYDAY AT BEDTIME 90 tablet 2   venlafaxine XR (EFFEXOR-XR) 75 MG 24 hr capsule Take 1 capsule (75 mg total) by mouth daily with breakfast. 90 capsule 1   No current facility-administered medications for this visit.   No results found.  Review of Systems:   A ROS was performed including pertinent positives and negatives as documented in the HPI.  Physical Exam :   Constitutional: NAD and appears stated age Neurological: Alert and oriented Psych: Appropriate affect and cooperative There were no vitals taken for this visit.   Comprehensive Musculoskeletal Exam:      Musculoskeletal Exam  Gait Normal  Alignment Normal   Right Left  Inspection Normal Normal  Palpation    Tenderness Medial joint line and posterior knee Medial joint line posterior knee  Crepitus None None  Effusion Trace None  Range of Motion    Extension 0 0  Flexion 135 135  Strength    Extension 5/5 5/5  Flexion 5/5 5/5  Ligament Exam     Generalized Laxity No No  Lachman Negative Negative   Pivot Shift Negative Negative  Anterior Drawer Negative Negative  Valgus at 0 Negative Negative  Valgus at 20 Negative  Negative  Varus at 0 0 0  Varus at 20   0 0  Posterior Drawer at 90 0 0  Vascular/Lymphatic Exam    Edema None None  Venous Stasis Changes No No  Distal Circulation Normal Normal  Neurologic    Light Touch Sensation Intact Intact  Special  Tests:      Imaging:   Xray (4 views right knee, left knee 4 views): Bilateral mild arthritis predominantly involving the medial tibiofemoral joint space    I personally reviewed and interpreted the radiographs.   Assessment:   66 year old female with bilateral very mild osteoarthritis.  At today's visit I did give her specific recommendations and we will plan to have her begin physical therapy for strengthening of both knees.  I will plan to provide her with over-the-counter Voltaren gel.  She will see me back as needed should she wish to pursue intra-articular knee injections  Plan :    -Return to clinic as needed     I personally saw and evaluated the patient, and participated in the management and treatment plan.  Huel Cote, MD Attending Physician, Orthopedic Surgery  This document was dictated using Dragon voice recognition software. A reasonable attempt at proof reading has been made to minimize errors.

## 2022-01-24 ENCOUNTER — Encounter: Payer: Self-pay | Admitting: Physical Therapy

## 2022-01-24 ENCOUNTER — Ambulatory Visit: Payer: Medicare HMO | Attending: Orthopaedic Surgery | Admitting: Physical Therapy

## 2022-01-24 DIAGNOSIS — M6281 Muscle weakness (generalized): Secondary | ICD-10-CM | POA: Diagnosis present

## 2022-01-24 DIAGNOSIS — M170 Bilateral primary osteoarthritis of knee: Secondary | ICD-10-CM | POA: Diagnosis present

## 2022-01-24 DIAGNOSIS — R2681 Unsteadiness on feet: Secondary | ICD-10-CM | POA: Insufficient documentation

## 2022-01-24 DIAGNOSIS — R262 Difficulty in walking, not elsewhere classified: Secondary | ICD-10-CM | POA: Insufficient documentation

## 2022-01-24 NOTE — Therapy (Signed)
OUTPATIENT PHYSICAL THERAPY LOWER EXTREMITY EVALUATION   Patient Name: Jessica Fry MRN: [id number] DOB:Feb 23, 1956, 66 y.o., female Today's Date: 01/24/2022   PT End of Session - 01/24/22 1320     Visit Number 1    Number of Visits 12    Date for PT Re-Evaluation 03/07/22    Authorization Type [employer] Medicare    Progress Note Due on Visit 10    PT Start Time 1321    PT Stop Time 1402    PT Time Calculation (min) 41 min    Activity Tolerance Patient tolerated treatment well    Behavior During Therapy WFL for tasks assessed/performed             Past Medical History:  Diagnosis Date   Alcohol addiction (HCC)    Allergy    Chicken pox    Depression    Eczema    GERD (gastroesophageal reflux disease)    Hyperlipidemia    Thyroid disease    Hypothyroid   Urine incontinence    Past Surgical History:  Procedure Laterality Date   nose-deviated septum injury  1972   Patient Active Problem List   Diagnosis Date Noted   Allergic rhinitis 10/26/2019   Alcohol dependence, in remission (HCC) 02/03/2019   Hyperlipidemia 08/26/2017   Major depression, recurrent (HCC) 08/26/2017   Primary hypothyroidism 08/26/2017    PCP: [place], Betty G, MD  REFERRING PROVIDER: Huel Cote MD  REFERRING DIAG: M17.0 (ICD-10-CM) - Primary osteoarthritis of both knees  THERAPY DIAG:  Unsteadiness on feet  Primary osteoarthritis of both knees  Difficulty in walking, not elsewhere classified  Muscle weakness (generalized)  Rationale for Evaluation and Treatment Rehabilitation  ONSET DATE:  Injured January 2023 knee pain since that episode.   SUBJECTIVE:   SUBJECTIVE STATEMENT: Patient enjoys tent camping and would like to be able to go on trails.  She reports she had a knee injury in January where 3 dogs ran into her, since then if she does a lot of work (I.e. up and down, stairs, etc) she has more pain in both knees, so hasn't been as active.  She had PT in the past  for weakness in glute muscles, so was trying to do some of her old exercises.  Doesn't feel real stable on her feet and feels pull from L hip to knee, and getting up from low toilet also hurts knees.    PERTINENT HISTORY: 08/02/21- right lateral tibial plateau fracture, hypothyroidism. Depression.  PAIN:  Are you having pain? Yes: NPRS scale: 0/10 Pain location: bil knees, 3/10 with standing up from low surface Pain description: aches Aggravating factors: stairs, standing up from low surface Relieving factors: rest  PRECAUTIONS: None  WEIGHT BEARING RESTRICTIONS No  FALLS:  Has patient fallen in last 6 months? No but feels unsteady  LIVING ENVIRONMENT: Lives with: lives alone Lives in: House/apartment Stairs: Yes: External: 3 steps; on right going up and no HR for back steps Has following equipment at home: None  OCCUPATION: retired  PLOF: Independent  PATIENT GOALS be able to hike, improve balance, be able to use exercise equipment has at home.    OBJECTIVE:   DIAGNOSTIC FINDINGS: 01/08/2022 X rays bilateral knees 4 views IMPRESSION: Mild degenerative joint changes of bilateral knees.  PATIENT SURVEYS:  LEFS 43/80= 54% ability or 46% disability  COGNITION:  Overall cognitive status: Within functional limits for tasks assessed     SENSATION: WFL  EDEMA:  NA  MUSCLE LENGTH: Hamstrings: Right -45  deg; Left -30 deg measured through popliteal angle Elys Test: Right 115 deg; Left 115 deg  noted femoral tension on R side.   POSTURE: forward head  PALPATION: Tenderness bil patellar tendons  LOWER EXTREMITY MMT:  MMT Right eval Left eval  Hip flexion 4+ 5  Hip extension 4 4+  Hip abduction 5 5  Hip adduction 5 5  Gluteus medius 4 4+  Knee flexion 5 5  Knee extension 5 5  Ankle dorsiflexion 5 5  Ankle plantarflexion     (Blank rows = not tested)  LOWER EXTREMITY ROM:  ROM Right eval Left eval  Knee flexion 122 122  Knee extension 0 0   (Blank rows =  not tested)  LOWER EXTREMITY SPECIAL TESTS:  Knee special tests: Anterior drawer test: negative, Posterior drawer test: negative, and Lachman Test: negative  FUNCTIONAL TESTS:  5 times sit to stand: 17.5 seconds without UE assist, reports increased R knee pain.    Single leg stance : L 7 sec, R 3 seconds.    FGA: 24/30   GAIT: Distance walked: 300' Assistive device utilized: None Level of assistance: Complete Independence Comments: lands very heavily on midfoot, wide BOS, feet in slight ER, gait speed 1.36m/s, fast walk 1.55 m/s, reciprocal step ascending stairs, no HR, descending reciprocal step with HR, reports increased knee pain bil at patellar tendon descending stairs.     TODAY'S TREATMENT: 01/24/2022 Self Care: see patient education.  Prone leg extensions x 10 bil    PATIENT EDUCATION:  Education details: education on findings, POC, goals, initial HEP, importance of hip strengthening and improving balance to prevent falls.  Person educated: Patient Education method: Explanation, Demonstration, Verbal cues, and Handouts Education comprehension: verbalized understanding   HOME EXERCISE PROGRAM: Access Code: [email or web address]   ASSESSMENT:  CLINICAL IMPRESSION: Patient is a 66 y.o. female who was seen today for physical therapy evaluation and treatment for bil knee OA.  She reports pain in bil patellar tendons primarily with movements that require increased load including descending stairs and getting up from low position. In addition to bil knee pain she demonstrates weakness in bil glut med and hip extensors, tightness in bil hamstrings, and impaired balance.  RLE slightly weaker, tighter and more painful than LLE.  She was unable to maintain SLS on R for more than 3 seconds and needed modA to prevent fall.  Her dynamic balance was good, scoring 24/30 on FGA, but overall she reports a feeling of unsteadiness.  Further balance testing is needed.  She would benefit from skilled physical  therapy to improve LE strength, balance, and safety in order to attain goal of hiking.    OBJECTIVE IMPAIRMENTS Abnormal gait, decreased activity tolerance, decreased balance, decreased mobility, difficulty walking, decreased strength, decreased safety awareness, increased fascial restrictions, impaired perceived functional ability, increased muscle spasms, impaired flexibility, and pain.   ACTIVITY LIMITATIONS carrying, lifting, bending, standing, squatting, stairs, transfers, and locomotion level  PARTICIPATION LIMITATIONS: cleaning, community activity, yard work, and hiking  PERSONAL FACTORS 1-2 comorbidities: bil knee OA, depression  are also affecting patient's functional outcome.   REHAB POTENTIAL: Good  CLINICAL DECISION MAKING: Evolving/moderate complexity  EVALUATION COMPLEXITY: Moderate   GOALS: Goals reviewed with patient? Yes  SHORT TERM GOALS: Target date: 02/07/2022   Patient will be independent with initial HEP. Baseline: given Goal status: INITIAL  2.  Complete Berg and mCTSIB Baseline: needs assessment Goal status: INITIAL   LONG TERM GOALS: Target date: 03/07/2022   Patient will be independent  with advanced/ongoing HEP to improve outcomes and carryover.  Baseline: needs progression Goal status: INITIAL  2.  Patient will report no pain in knees with sit to stands or stairs. Baseline: 3/10 bil knee pain with stairs and transfers Goal status: INITIAL  3.  Patient will demonstrate decreased hamstring tightness by 15 deg bil for normal gait.  Baseline: see objective, unable to extend knees in sitting fully Goal status: INITIAL  4.  Patient will demonstrate 5/5 bil LE strength for safety with gait.  Baseline: see objective, glut weakness bil Goal status: INITIAL  5.  Patient will be able to maintain SLS x 15 second bil to decrease risk of falls.  Baseline: L 7 sec, R 3 seconds Goal status: INITIAL  6. Patient will be able to ascend/descend 2 flights of  stairs without HR safely without increased pain to be able to go on hikes.  Baseline: increased pain bil knees, decreased eccentric control descending Goal status: INITIAL  7.  Patient will report >52/80 on LEFS to demonstrate improved functional ability. Baseline: 43/80 = 46% disability  Goal status: INITIAL  8.  Patient will demonstrate at least 50/56 on Berg to decrease risk of falls.  Baseline: TBD Goal status: INITIAL    PLAN: PT FREQUENCY: 1-2x/week  PT DURATION: 6 weeks  PLANNED INTERVENTIONS: Therapeutic exercises, Therapeutic activity, Neuromuscular re-education, Balance training, Gait training, Patient/Family education, Self Care, Joint mobilization, Stair training, Dry Needling, Electrical stimulation, Cryotherapy, Moist heat, Vasopneumatic device, Ultrasound, Ionotophoresis 4mg /ml Dexamethasone, and Manual therapy  PLAN FOR NEXT SESSION: HEP for hip strengthening and HS stretches, perform BERG and mCTSIB, manual therapy/modalities PRN.    , PT, DPT  01/24/2022, 3:57 PM

## 2022-01-31 ENCOUNTER — Ambulatory Visit: Payer: Medicare HMO

## 2022-01-31 DIAGNOSIS — M6281 Muscle weakness (generalized): Secondary | ICD-10-CM

## 2022-01-31 DIAGNOSIS — R2681 Unsteadiness on feet: Secondary | ICD-10-CM

## 2022-01-31 DIAGNOSIS — M170 Bilateral primary osteoarthritis of knee: Secondary | ICD-10-CM

## 2022-01-31 DIAGNOSIS — R262 Difficulty in walking, not elsewhere classified: Secondary | ICD-10-CM

## 2022-01-31 NOTE — Therapy (Signed)
OUTPATIENT PHYSICAL THERAPY TREATMENT   Patient Name: Jessica Fry MRN: [id number] DOB:05-19-1956, 66 y.o., female Today's Date: 01/31/2022   PT End of Session - 01/31/22 1353     Visit Number 2    Number of Visits 12    Date for PT Re-Evaluation 03/07/22    Authorization Type [employer]    Progress Note Due on Visit 10    PT Start Time 2620    PT Stop Time 1400    PT Time Calculation (min) 43 min    Activity Tolerance Patient tolerated treatment well    Behavior During Therapy [facility] for tasks assessed/performed              Past Medical History:  Diagnosis Date   Alcohol addiction ([place])    Allergy    Chicken pox    Depression    Eczema    GERD (gastroesophageal reflux disease)    Hyperlipidemia    Thyroid disease    Hypothyroid   Urine incontinence    Past Surgical History:  Procedure Laterality Date   nose-deviated septum injury  1972   Patient Active Problem List   Diagnosis Date Noted   Allergic rhinitis 10/26/2019   Alcohol dependence, in remission ([place]) 02/03/2019   Hyperlipidemia 08/26/2017   Major depression, recurrent ([place]) 08/26/2017   Primary hypothyroidism 08/26/2017    PCP: [place], Betty G, MD  REFERRING PROVIDER: Vanetta Mulders MD  REFERRING DIAG: M17.0 (ICD-10-CM) - Primary osteoarthritis of both knees  THERAPY DIAG:  Primary osteoarthritis of both knees  Unsteadiness on feet  Difficulty in walking, not elsewhere classified  Muscle weakness (generalized)  Rationale for Evaluation and Treatment Rehabilitation  ONSET DATE:  Injured January 2023 knee pain since that episode.   SUBJECTIVE:   SUBJECTIVE STATEMENT: Pt reports feeling "clunky" with walking, having no pain right now and doing HEP not every day but as much as she can.  PERTINENT HISTORY: 08/02/21- right lateral tibial plateau fracture, hypothyroidism. Depression.  PAIN:  Are you having pain? Yes: NPRS scale: 0/10 Pain location: bil knees, 3/10 with  standing up from low surface Pain description: aches Aggravating factors: stairs, standing up from low surface Relieving factors: rest  PRECAUTIONS: None  WEIGHT BEARING RESTRICTIONS No  FALLS:  Has patient fallen in last 6 months? No but feels unsteady  LIVING ENVIRONMENT: Lives with: lives alone Lives in: House/apartment Stairs: Yes: External: 3 steps; on right going up and no HR for back steps Has following equipment at home: None  OCCUPATION: retired  PLOF: [place] be able to hike, improve balance, be able to use exercise equipment has at home.    OBJECTIVE:   DIAGNOSTIC FINDINGS: 01/08/2022 X rays bilateral knees 4 views IMPRESSION: Mild degenerative joint changes of bilateral knees.  PATIENT SURVEYS:  LEFS 43/80= 54% ability or 46% disability  COGNITION:  Overall cognitive status: Within functional limits for tasks assessed     SENSATION: WFL  EDEMA:  NA  MUSCLE LENGTH: Hamstrings: Right -45 deg; Left -30 deg measured through popliteal angle Elys Test: Right 115 deg; Left 115 deg  noted femoral tension on R side.   POSTURE: forward head  PALPATION: Tenderness bil patellar tendons  LOWER EXTREMITY MMT:  MMT Right eval Left eval  Hip flexion 4+ 5  Hip extension 4 4+  Hip abduction 5 5  Hip adduction 5 5  Gluteus medius 4 4+  Knee flexion 5 5  Knee extension 5 5  Ankle dorsiflexion 5 5  Ankle plantarflexion     (Blank rows = not tested)  LOWER EXTREMITY ROM:  ROM Right eval Left eval  Knee flexion 122 122  Knee extension 0 0   (Blank rows = not tested)  LOWER EXTREMITY SPECIAL TESTS:  Knee special tests: Anterior drawer test: negative, Posterior drawer test: negative, and Lachman Test: negative  FUNCTIONAL TESTS:  5 times sit to stand: 17.5 seconds without UE assist, reports increased R knee pain.    Single leg stance : L 7 sec, R 3 seconds.    FGA: 24/30    BERG: 50/54 (low fall risk) GAIT: Distance walked:  300' Assistive device utilized: None Level of assistance: Complete Independence Comments: lands very heavily on midfoot, wide BOS, feet in slight ER, gait speed 1.78ms, fast walk 1.55 m/s, reciprocal step ascending stairs, no HR, descending reciprocal step with HR, reports increased knee pain bil at patellar tendon descending stairs.     TODAY'S TREATMENT: 01/31/22 Therapeutic Exercise: Nustep L2x618m STS x 10 LAQ 10x3" bil Standing L hip flexion x 10 RTB Standing L hip abduction x 10 RTB Standing L hip ext x 10 RTB Standing L hip ADD x 10 RTB  01/24/2022 Self Care: see patient education.  Prone leg extensions x 10 bil    PATIENT EDUCATION:  Education details: education on findings, POC, goals, initial HEP, importance of hip strengthening and improving balance to prevent falls.  Person educated: Patient Education method: Explanation, Demonstration, Verbal cues, and Handouts Education comprehension: verbalized understanding   HOME EXERCISE PROGRAM: Access Code: [email or web address]   ASSESSMENT:  CLINICAL IMPRESSION: Assessed BERG during session and found mod fall risk with patient scoring 50/56. Progressed hip/knee strengthening and focused on HEP progression as pt will be out of town for the next 2 weeks. Cues given throughout session to correct form and to target the correct muscles. She did demonstrate weakness and low muscle endurance with exercises.    OBJECTIVE IMPAIRMENTS Abnormal gait, decreased activity tolerance, decreased balance, decreased mobility, difficulty walking, decreased strength, decreased safety awareness, increased fascial restrictions, impaired perceived functional ability, increased muscle spasms, impaired flexibility, and pain.   ACTIVITY LIMITATIONS carrying, lifting, bending, standing, squatting, stairs, transfers, and locomotion level  PARTICIPATION LIMITATIONS: cleaning, community activity, yard work, and hiking  PERSONAL FACTORS 1-2 comorbidities: bil knee  OA, depression  are also affecting patient's functional outcome.   REHAB POTENTIAL: Good  CLINICAL DECISION MAKING: Evolving/moderate complexity  EVALUATION COMPLEXITY: Moderate   GOALS: Goals reviewed with patient? Yes  SHORT TERM GOALS: Target date: 02/07/2022   Patient will be independent with initial HEP. Baseline: given Goal status: MET - 01/31/22  2.  Complete Berg and mCTSIB Baseline: needs assessment Goal status: INITIAL   LONG TERM GOALS: Target date: 03/07/2022   Patient will be independent with advanced/ongoing HEP to improve outcomes and carryover.  Baseline: needs progression Goal status: INITIAL  2.  Patient will report no pain in knees with sit to stands or stairs. Baseline: 3/10 bil knee pain with stairs and transfers Goal status: INITIAL  3.  Patient will demonstrate decreased hamstring tightness by 15 deg bil for normal gait.  Baseline: see objective, unable to extend knees in sitting fully Goal status: INITIAL  4.  Patient will demonstrate 5/5 bil LE strength for safety with gait.  Baseline: see objective, glut weakness bil Goal status: INITIAL  5.  Patient will be able to maintain SLS x 15 second bil to decrease risk of falls.  Baseline: L 7 sec, R 3 seconds  Goal status: INITIAL  6. Patient will be able to ascend/descend 2 flights of stairs without HR safely without increased pain to be able to go on hikes.  Baseline: increased pain bil knees, decreased eccentric control descending Goal status: INITIAL  7.  Patient will report >52/80 on LEFS to demonstrate improved functional ability. Baseline: 43/80 = 46% disability  Goal status: INITIAL  8.  Patient will demonstrate at least 50/56 on Berg to decrease risk of falls.  Baseline: TBD Goal status: INITIAL    PLAN: PT FREQUENCY: 1-2x/week  PT DURATION: 6 weeks  PLANNED INTERVENTIONS: Therapeutic exercises, Therapeutic activity, Neuromuscular re-education, Balance training, Gait training,  Patient/Family education, Self Care, Joint mobilization, Stair training, Dry Needling, Electrical stimulation, Cryotherapy, Moist heat, Vasopneumatic device, Ultrasound, Ionotophoresis 36m/ml Dexamethasone, and Manual therapy  PLAN FOR NEXT SESSION: HEP for hip strengthening and HS stretches, perform BERG and mCTSIB, manual therapy/modalities PRN.    BArtist Pais PTA, DPT  01/31/2022, 2:02 PM

## 2022-02-05 ENCOUNTER — Emergency Department: Admit: 2022-02-05 | Payer: MEDICARE

## 2022-02-05 ENCOUNTER — Inpatient Hospital Stay
Admit: 2022-02-05 | Discharge: 2022-02-05 | Disposition: A | Discharge status: Home / Self Care | Payer: MEDICARE | Attending: Emergency Medicine

## 2022-02-05 DIAGNOSIS — S20212A Contusion of left front wall of thorax, initial encounter: Secondary | ICD-10-CM

## 2022-02-05 MED ORDER — IBUPROFEN 600 MG PO TABS
600 MG | ORAL_TABLET | Freq: Four times a day (QID) | ORAL | 0 refills | Status: AC | PRN
Start: 2022-02-05 — End: ?

## 2022-02-05 MED ORDER — KETOROLAC TROMETHAMINE 30 MG/ML IJ SOLN
30 MG/ML | Freq: Once | INTRAMUSCULAR | Status: AC
Start: 2022-02-05 — End: 2022-02-05
  Administered 2022-02-05: 17:00:00 30 mg via INTRAMUSCULAR

## 2022-02-05 MED ORDER — HYDROCODONE-ACETAMINOPHEN 5-325 MG PO TABS
5-325 MG | ORAL_TABLET | Freq: Four times a day (QID) | ORAL | 0 refills | Status: AC | PRN
Start: 2022-02-05 — End: 2022-02-08

## 2022-02-05 MED FILL — KETOROLAC TROMETHAMINE 30 MG/ML IJ SOLN: 30 MG/ML | INTRAMUSCULAR | Qty: 1

## 2022-02-05 NOTE — ED Notes (Signed)
Pt to xray      Driscilla Moats, RN  02/05/22 [phone]

## 2022-02-05 NOTE — ED Provider Notes (Signed)
eMERGENCY dEPARTMENT eNCOUnter   Independent Attestation     Pt Name: Ashley Burgess  MRN: [id number]  Birthdate 1955/10/28  Date of evaluation: 02/05/22     MAELA TAKEDA is a 66 y.o. female with CC: Rib Pain (injury) (Hit left side of a wall 3am, states at the store, sneeze felt a pop)        This visit was performed by both a physician and an APC. I performed all aspects of the MDM as documented.      Cherie Ouch, MD  Attending Emergency Physician           Cherie Ouch, MD  02/05/22 [phone]

## 2022-02-05 NOTE — ED Notes (Signed)
Pt ambulated back to bed with little assistance      Driscilla Moats, RN  02/05/22 1328

## 2022-02-05 NOTE — ED Notes (Signed)
Spoke with son about POC.      Driscilla Moats, RN  02/05/22 1322

## 2022-02-05 NOTE — ED Provider Notes (Cosign Needed)
[facility]  [facility] ED  eMERGENCY dEPARTMENT eNCOUnter      Pt Name: Ashley Burgess  MRN: [id number]  Birthdate 03/03/1956  Date of evaluation: 02/05/2022  Provider: Eudelia Bunch, APRN - CNP    CHIEF COMPLAINT       Chief Complaint   Patient presents with    Rib Pain (injury)     Hit left side of a wall 3am, states at the store, sneeze felt a pop         HISTORY OF PRESENT ILLNESS  (Location/Symptom, Timing/Onset, Context/Setting, Quality, Duration, Modifying Factors, Severity.)   Ashley Burgess is a 66 y.o. female who presents to the emergency department for evaluation of left rib injury after she hit her ribs on the edge of a counter when she got out of bed around 3:00 in the morning.  Patient states after that when she was at the store this morning she sneezed and felt a pop in her left rib area.  Pain is a 4-10 and is aggravated with certain movement.  No history of asthma, COPD or tobacco use.      Nursing Notes were reviewed.    ALLERGIES     Other    CURRENT MEDICATIONS       Discharge Medication List as of 02/05/2022  2:14 PM        CONTINUE these medications which have NOT CHANGED    Details   aspirin 81 MG tablet Take 81 mg by mouth daily.      escitalopram (LEXAPRO) 20 MG tablet Take 10 mg by mouth daily.      simvastatin (ZOCOR) 20 MG tablet Take 20 mg by mouth nightly.      levothyroxine (SYNTHROID) 88 MCG tablet Take 88 mcg by mouth Daily.      methylphenidate (CONCERTA) 36 MG CR tablet Take 36 mg by mouth every morning.             PAST MEDICAL HISTORY         Diagnosis Date    Depression     Hyperlipidemia     Hypothyroidism     Thyroid disease     TIA (transient ischemic attack)     Unspecified sleep apnea        SURGICAL HISTORY           Procedure Laterality Date    NASAL SEPTUM SURGERY Bilateral          FAMILY HISTORY           Problem Relation Age of Onset    Stroke Father     Stroke Paternal Aunt      Family Status   Relation Name Status    Father  (Not Specified)    PAunt  (Not  Specified)        SOCIAL HISTORY      reports that she has never smoked. She has never used smokeless tobacco. She reports current alcohol use of about 1.0 standard drink per week. She reports that she does not use drugs.    REVIEW OF SYSTEMS    (2-9 systems for level 4, 10 or more for level 5)   Review of Systems   Respiratory:  Negative for shortness of breath.    Cardiovascular:  Negative for chest pain.   Musculoskeletal:  Positive for arthralgias.   Skin:  Positive for color change.   All other systems reviewed and are negative.     Except as noted above  the remainder of the review of systems was reviewed and negative.     PHYSICAL EXAM    (up to 7 for level 4, 8 or more for level 5)     ED Triage Vitals   BP Temp Temp Source Pulse Respirations SpO2 Height Weight - Scale   02/05/22 1240 02/05/22 1237 02/05/22 1237 02/05/22 1237 02/05/22 1237 02/05/22 1237 02/05/22 1237 02/05/22 1237   108/77 97.9 F (36.6 C) Oral 66 16 97 % 5\' 7"  (1.702 m) 220 lb (99.8 kg)     Physical Exam  Constitutional:       Appearance: Normal appearance. She is normal weight.   HENT:      Head: Normocephalic.      Right Ear: External ear normal.      Left Ear: External ear normal.      Nose: Nose normal.   Eyes:      Conjunctiva/sclera: Conjunctivae normal.   Pulmonary:      Effort: Pulmonary effort is normal.      Breath sounds: Normal breath sounds and air entry.   Chest:      Chest wall: Tenderness present. No crepitus.          Comments: There is small area of ecchymosis and tenderness to the left rib area.  No crepitus.  Musculoskeletal:         General: Normal range of motion.      Cervical back: Normal range of motion.   Skin:     General: Skin is warm and dry.      Findings: Bruising present.   Neurological:      Mental Status: She is alert and oriented to person, place, and time.           DIAGNOSTIC RESULTS     EKG: All EKG's are interpreted by the Emergency Department Physician who either signs or Co-signs this chart in the  absence of a cardiologist.      RADIOLOGY:   Non-plain film images such as CT, Ultrasound and MRI are read by the radiologist. Plain radiographic images are visualized and preliminarily interpreted by the emergency physician with the below findings:    Interpretation per the Radiologist below, if available at the time of this note:    XR RIBS LEFT INCLUDE CHEST (MIN 3 VIEWS)    Result Date: 02/05/2022  EXAMINATION: 4 XRAY VIEWS OF THE LEFT RIBS WITH FRONTAL XRAY VIEW OF THE CHEST 02/05/2022 1:01 pm COMPARISON: Chest radiograph dated 03/02/2012 HISTORY: ORDERING SYSTEM PROVIDED HISTORY: Left rib pain after hitting ribs on edge of counter today TECHNOLOGIST PROVIDED HISTORY: Left rib pain after hitting ribs on edge of counter today Reason for Exam: Pt c/o pain to left ribs, s/p injury this am. FINDINGS: Medical devices: None. Mediastinum/Heart: The mediastinal contours are unchanged compared to prior exam. The heart appears normal in size. Lungs: The lungs are clear. Pleura: No findings to suggest pneumothorax or large pleural effusion. Bones/Soft tissues: Nothing acute.     No rib fracture detected. Clear lungs.         LABS:  Labs Reviewed - No data to display    All other labs were within normal range or not returned as of this dictation.    EMERGENCY DEPARTMENT COURSE and DIFFERENTIAL DIAGNOSIS/MDM:   Vitals:    Vitals:    02/05/22 1237 02/05/22 1240   BP:  108/77   Pulse: 66    Resp: 16    Temp: 97.9 F (36.6 C)  TempSrc: Oral    SpO2: 97%    Weight: 220 lb (99.8 kg)    Height: 5\' 7"  (1.702 m)          MEDICATIONS GIVEN IN THE ED:  Medications   ketorolac (TORADOL) injection 30 mg (30 mg IntraMUSCular Given 02/05/22 1303)       CLINICAL DECISION MAKING:  The patient presented alert with a nontoxic appearance and was seen in conjunction with Dr. Derinda Sis.     DDx include rib contusion, rib fracture      I will order   Orders Placed This Encounter   Procedures    XR RIBS LEFT INCLUDE CHEST (MIN 3 VIEWS)      Standing Status:   Standing     Number of Occurrences:   1     Order Specific Question:   Reason for exam:     Answer:   Left rib pain after hitting ribs on edge of counter today      The patient was involved in his/her plan of care through shared decision making. The testing that was ordered was discussed with the patient. Any medications that may have been ordered were discussed with the patient.       I have reviewed the patient's previous medical records using the electronic health record that we have available.      imaging were reviewed.  Imaging was reviewed and reported by the radiologist. Results showed x-ray left ribs and chest negative for acute findings.  Damage shows rib contusion.  Given Toradol in ED and was discharged without spirometer.  Will discharge on pain medicine.  OARRS reviewed.  Discussed imaging results diagnosis and follow-up care with the patient.      Evaluation and treatment course in the ED, and plan of care upon discharge was discussed in length with the patient. Patient had no further questions prior to being discharged and was instructed to return to the ED for new or worsening symptoms.      CONSULTS:  None    PROCEDURES:  Procedures    FINAL IMPRESSION      1. Contusion of rib on left side, initial encounter            Problem List  There is no problem list on file for this patient.        DISPOSITION/PLAN   DISPOSITION Decision To Discharge 02/05/2022 02:11:50 PM      PATIENT REFERRED TO:   [facility] ED  [address] [place] [address]  [phone]    If symptoms worsen      DISCHARGE MEDICATIONS:     Discharge Medication List as of 02/05/2022  2:14 PM        START taking these medications    Details   ibuprofen (IBU) 600 MG tablet Take 1 tablet by mouth every 6 hours as needed for Pain, Disp-30 tablet, R-0Print      HYDROcodone-acetaminophen (NORCO) 5-325 MG per tablet Take 1 tablet by mouth every 6 hours as needed for Pain for up to 3 days. Intended supply: 3  days. Take lowest dose possible to manage pain Max Daily Amount: 4 tablets, Disp-10 tablet, R-0Print                 (Please note that portions of this note were completed with a voice recognition program.  Efforts were made to edit the dictations but occasionally words are mis-transcribed.)    Eudelia Bunch, APRN - CNP  Eudelia Bunch, APRN - CNP  02/05/22 [phone]

## 2022-02-05 NOTE — ED Notes (Signed)
Pt resting on cot in no acute distress. All safety measures met. RR even and non labored. Pt denies any needs at this time.      Youlanda Mighty, RN  02/05/22 1248

## 2022-02-05 NOTE — Discharge Instructions (Signed)
Take medication prescribed.  Norco can cause drowsiness.  Practice deep breathing exercises as described in instructions daily with incentive spirometer provided to help prevent pneumonia.  Follow-up with your doctor when you get back into town.  Return to emergency department if symptoms worsen.

## 2022-02-07 ENCOUNTER — Telehealth: Payer: Self-pay | Admitting: Family Medicine

## 2022-02-07 ENCOUNTER — Encounter: Payer: Medicare HMO | Admitting: Physical Therapy

## 2022-02-07 DIAGNOSIS — F330 Major depressive disorder, recurrent, mild: Secondary | ICD-10-CM

## 2022-02-07 MED ORDER — VENLAFAXINE HCL ER 75 MG PO CP24: 75.0000 mg | ORAL_CAPSULE | Freq: Every day | ORAL | 0 refills | Status: DC

## 2022-02-07 NOTE — Telephone Encounter (Signed)
Pt is out of town and  accidentally left her medication at home and will need a new Rx  venlafaxine XR (EFFEXOR-XR) 75 MG 24 hr capsule  # 14  [id number] [place], [place] - [address] AT [facility] OF [address] Phone:  [phone]  Fax:  [phone]

## 2022-02-07 NOTE — Telephone Encounter (Signed)
Rx sent in as requested. 

## 2022-02-14 ENCOUNTER — Encounter: Payer: Medicare HMO | Admitting: Physical Therapy

## 2022-02-18 ENCOUNTER — Encounter: Payer: Self-pay | Admitting: Physical Therapy

## 2022-02-19 ENCOUNTER — Ambulatory Visit: Payer: Medicare HMO

## 2022-02-20 ENCOUNTER — Telehealth: Payer: Self-pay

## 2022-02-20 ENCOUNTER — Emergency Department (HOSPITAL_BASED_OUTPATIENT_CLINIC_OR_DEPARTMENT_OTHER): Payer: Medicare HMO

## 2022-02-20 ENCOUNTER — Encounter (HOSPITAL_BASED_OUTPATIENT_CLINIC_OR_DEPARTMENT_OTHER): Payer: Self-pay | Admitting: Urology

## 2022-02-20 ENCOUNTER — Ambulatory Visit: Payer: Medicare HMO | Admitting: Family Medicine

## 2022-02-20 ENCOUNTER — Emergency Department (HOSPITAL_BASED_OUTPATIENT_CLINIC_OR_DEPARTMENT_OTHER)
Admission: EM | Admit: 2022-02-20 | Discharge: 2022-02-20 | Disposition: A | Discharge status: Home / Self Care | Payer: Medicare HMO | Attending: Emergency Medicine | Admitting: Emergency Medicine

## 2022-02-20 DIAGNOSIS — U071 COVID-19: Secondary | ICD-10-CM | POA: Diagnosis not present

## 2022-02-20 DIAGNOSIS — R0602 Shortness of breath: Secondary | ICD-10-CM

## 2022-02-20 DIAGNOSIS — Z7982 Long term (current) use of aspirin: Secondary | ICD-10-CM | POA: Diagnosis not present

## 2022-02-20 LAB — CBC WITH DIFFERENTIAL/PLATELET
Abs Immature Granulocytes: 0.04 10*3/uL (ref 0.00–0.07)
Basophils Absolute: 0 10*3/uL (ref 0.0–0.1)
Basophils Relative: 0 %
Eosinophils Absolute: 0.1 10*3/uL (ref 0.0–0.5)
Eosinophils Relative: 1 %
HCT: 38 % (ref 36.0–46.0)
Hemoglobin: 12.7 g/dL (ref 12.0–15.0)
Immature Granulocytes: 1 %
Lymphocytes Relative: 20 %
Lymphs Abs: 1.5 10*3/uL (ref 0.7–4.0)
MCH: 28.3 pg (ref 26.0–34.0)
MCHC: 33.4 g/dL (ref 30.0–36.0)
MCV: 84.8 fL (ref 80.0–100.0)
Monocytes Absolute: 0.5 10*3/uL (ref 0.1–1.0)
Monocytes Relative: 7 %
Neutro Abs: 5.5 10*3/uL (ref 1.7–7.7)
Neutrophils Relative %: 71 %
Platelets: 328 10*3/uL (ref 150–400)
RBC: 4.48 MIL/uL (ref 3.87–5.11)
RDW: 13.3 % (ref 11.5–15.5)
WBC: 7.6 10*3/uL (ref 4.0–10.5)
nRBC: 0 % (ref 0.0–0.2)

## 2022-02-20 LAB — COMPREHENSIVE METABOLIC PANEL
ALT: 22 U/L (ref 0–44)
AST: 24 U/L (ref 15–41)
Albumin: 3.4 g/dL — ABNORMAL LOW (ref 3.5–5.0)
Alkaline Phosphatase: 75 U/L (ref 38–126)
Anion gap: 6 (ref 5–15)
BUN: 17 mg/dL (ref 8–23)
CO2: 24 mmol/L (ref 22–32)
Calcium: 8.4 mg/dL — ABNORMAL LOW (ref 8.9–10.3)
Chloride: 105 mmol/L (ref 98–111)
Creatinine, Ser: 0.84 mg/dL (ref 0.44–1.00)
GFR, Estimated: >60 mL/min (ref >60)
Glucose, Bld: 133 mg/dL — ABNORMAL HIGH (ref 70–99)
Potassium: 3.6 mmol/L (ref 3.5–5.1)
Sodium: 135 mmol/L (ref 135–145)
Total Bilirubin: 0.5 mg/dL (ref 0.3–1.2)
Total Protein: 6.8 g/dL (ref 6.5–8.1)

## 2022-02-20 LAB — PROCALCITONIN: Procalcitonin: <0.1 ng/mL

## 2022-02-20 LAB — LACTATE DEHYDROGENASE: LDH: 198 U/L — ABNORMAL HIGH (ref 98–192)

## 2022-02-20 LAB — LACTIC ACID, PLASMA: Lactic Acid, Venous: 1.1 mmol/L (ref 0.5–1.9)

## 2022-02-20 LAB — C-REACTIVE PROTEIN: CRP: 1.1 mg/dL — ABNORMAL HIGH (ref <1.0)

## 2022-02-20 LAB — FIBRINOGEN: Fibrinogen: 510 mg/dL — ABNORMAL HIGH (ref 210–475)

## 2022-02-20 LAB — SARS CORONAVIRUS 2 BY RT PCR: SARS Coronavirus 2 by RT PCR: POSITIVE — AB

## 2022-02-20 LAB — TRIGLYCERIDES: Triglycerides: 206 mg/dL — ABNORMAL HIGH (ref <150)

## 2022-02-20 LAB — D-DIMER, QUANTITATIVE: D-Dimer, Quant: 0.49 ug/mL-FEU (ref 0.00–0.50)

## 2022-02-20 LAB — FERRITIN: Ferritin: 106 ng/mL (ref 11–307)

## 2022-02-20 NOTE — ED Notes (Addendum)
Pt ambulated to BR oxygen sats 96%

## 2022-02-20 NOTE — Discharge Instructions (Addendum)
You were seen in the emergency department for worsening shortness of breath in the setting of a COVID diagnosis.  Your chest x-ray was clear and your oxygen level stable.  The lab work that had resulted at time of discharge was fairly unremarkable.  Please follow-up with your primary care doctor.  Rest drink plenty of fluids.  Return to the emergency department if any worsening or concerning symptoms

## 2022-02-20 NOTE — ED Provider Notes (Signed)
[facility] EMERGENCY DEPARTMENT Provider Note   CSN: [id number] Arrival date & time: 02/20/22  1739     History {Add pertinent medical, surgical, social history, OB history to HPI:1} Chief Complaint  Patient presents with   Covid Positive    Jessica Fry is a 66 y.o. female.  She is here with a complaint of shortness of breath dyspnea on exertion its been going on for a few days.  She said she actually got sick about 12 days ago while she was visiting in [place] and had high fevers.  Most of her symptoms have improved but she feels her breathing is getting worse.  She feels like she is laboring for her breathing even when she is talking.  She denies any chest pain abdominal pain nausea vomiting diarrhea.  She has decreased appetite.  Had some headache and body aches earlier but none now.  She has tried nothing for her symptoms.  She does not have any pulmonary history.  She is vaccinated and had one booster.  The history is provided by the patient.  Shortness of Breath Severity:  Moderate Onset quality:  Gradual Duration:  1 week Timing:  Intermittent Progression:  Worsening Chronicity:  New Relieved by:  Nothing Worsened by:  Activity Ineffective treatments:  Rest Associated symptoms: cough and fever   Associated symptoms: no abdominal pain, no chest pain, no hemoptysis, no rash, no sore throat, no sputum production and no vomiting   Risk factors: no tobacco use        Home Medications Prior to Admission medications   Medication Sig Start Date End Date Taking? Authorizing Provider  BABY ASPIRIN PO Take 81 mg by mouth daily.     [provider]  Cholecalciferol (VITAMIN D3 PO) Take 1 tablet by mouth daily.     [provider]  clotrimazole-betamethasone (LOTRISONE) cream Apply 1 application topically daily as needed. Patient not taking: Reported on 01/24/2022 02/15/21   [place], Betty G, MD  diclofenac Sodium (VOLTAREN) 1 % GEL Apply 4 g  topically 4 (four) times daily. 01/08/22   Huel Cote, MD  levothyroxine (SYNTHROID) 100 MCG tablet TAKE 1 AND 1/2 TABLETS BY MOUTH MONDAY AND WEDNESDAY AND 1 TABLET BY MOUTH ALL OTHER DAYS 11/22/21   [place], Betty G, MD  nystatin (MYCOSTATIN/NYSTOP) powder Apply 1 application topically 2 (two) times daily as needed. Patient not taking: Reported on 01/24/2022 02/15/21   [place], Betty G, MD  simvastatin (ZOCOR) 40 MG tablet TAKE 1 TABLET BY MOUTH EVERYDAY AT BEDTIME 03/22/21   [place], Betty G, MD  venlafaxine XR (EFFEXOR-XR) 75 MG 24 hr capsule Take 1 capsule (75 mg total) by mouth daily with breakfast. 02/07/22   [place], Betty G, MD      Allergies    Penicillins    Review of Systems   Review of Systems  Constitutional:  Positive for fever.  HENT:  Negative for sore throat.   Eyes:  Negative for visual disturbance.  Respiratory:  Positive for cough and shortness of breath. Negative for hemoptysis and sputum production.   Cardiovascular:  Negative for chest pain.  Gastrointestinal:  Negative for abdominal pain and vomiting.  Genitourinary:  Negative for dysuria.  Skin:  Negative for rash.  Neurological:  Negative for syncope.    Physical Exam Updated Vital Signs BP 131/65 (BP Location: Left Arm)   Pulse 80   Temp 99.1 F (37.3 C) (Oral)   Resp 20   Ht 5\' 7"  (1.702 m)  Wt 93 kg   SpO2 95%   BMI 32.11 kg/m  Physical Exam Vitals and nursing note reviewed.  Constitutional:      General: She is not in acute distress.    Appearance: Normal appearance. She is well-developed.  HENT:     Head: Normocephalic and atraumatic.  Eyes:     Conjunctiva/sclera: Conjunctivae normal.  Cardiovascular:     Rate and Rhythm: Normal rate and regular rhythm.     Heart sounds: No murmur heard. Pulmonary:     Effort: Pulmonary effort is normal. No respiratory distress.     Breath sounds: Normal breath sounds.  Abdominal:     Palpations: Abdomen is soft.     Tenderness: There is no abdominal  tenderness. There is no guarding or rebound.  Musculoskeletal:     Cervical back: Neck supple.     Right lower leg: No edema.     Left lower leg: No edema.  Skin:    General: Skin is warm and dry.     Capillary Refill: Capillary refill takes less than 2 seconds.  Neurological:     General: No focal deficit present.     Mental Status: She is alert.     ED Results / Procedures / Treatments   Labs (all labs ordered are listed, but only abnormal results are displayed) Labs Reviewed  SARS CORONAVIRUS 2 BY RT PCR  CULTURE, BLOOD (ROUTINE X 2)  CULTURE, BLOOD (ROUTINE X 2)  LACTIC ACID, PLASMA  LACTIC ACID, PLASMA  CBC WITH DIFFERENTIAL/PLATELET  COMPREHENSIVE METABOLIC PANEL  D-DIMER, QUANTITATIVE  PROCALCITONIN  LACTATE DEHYDROGENASE  FERRITIN  TRIGLYCERIDES  FIBRINOGEN  C-REACTIVE PROTEIN    EKG None  Radiology No results found.  Procedures Procedures  {Document cardiac monitor, telemetry assessment procedure when appropriate:1}  Medications Ordered in ED Medications - No data to display  ED Course/ Medical Decision Making/ A&P                           Medical Decision Making Amount and/or Complexity of Data Reviewed Labs: ordered. Radiology: ordered. ECG/medicine tests: ordered.  Jessica Fry was evaluated in Emergency Department on 02/20/2022 for the symptoms described in the history of present illness. She was evaluated in the context of the global COVID-19 pandemic, which necessitated consideration that the patient might be at risk for infection with the SARS-CoV-2 virus that causes COVID-19. Institutional protocols and algorithms that pertain to the evaluation of patients at risk for COVID-19 are in a state of rapid change based on information released by regulatory bodies including the CDC and federal and state organizations. These policies and algorithms were followed during the patient's care in the ED.  This patient complains of ***; this  involves an extensive number of treatment Options and is a complaint that carries with it a high risk of complications and morbidity. The differential includes ***  I ordered, reviewed and interpreted labs, which included *** I ordered medication *** and reviewed PMP when indicated. I ordered imaging studies which included *** and I independently    visualized and interpreted imaging which showed *** Additional history obtained from *** Previous records obtained and reviewed *** I consulted *** and discussed lab and imaging findings and discussed disposition.  Cardiac monitoring reviewed, *** Social determinants considered, *** Critical Interventions: ***  After the interventions stated above, I reevaluated the patient and found *** Admission and further testing considered, ***    {Document critical care  time when appropriate:1} {Document review of labs and clinical decision tools ie heart score, Chads2Vasc2 etc:1}  {Document your independent review of radiology images, and any outside records:1} {Document your discussion with family members, caretakers, and with consultants:1} {Document social determinants of health affecting pt's care:1} {Document your decision making why or why not admission, treatments were needed:1} Final Clinical Impression(s) / ED Diagnoses Final diagnoses:  None    Rx / DC Orders ED Discharge Orders     None

## 2022-02-20 NOTE — ED Triage Notes (Signed)
Pt states COVID pos test Sunday at home States cough, SOB and fatigue

## 2022-02-20 NOTE — Telephone Encounter (Signed)
Caller states she tested positive for COVID19 on sunday. Pts symptoms started 12 days ago. Her sx are tiredness, not sleeping well, mild cough, and decreased appetite. Pt has been taking OTC medications which has helped.   02/20/2022 2:24:33 PM Call PCP within 24 Hours Iona Coach, RN, [employer]  Referrals REFERRED TO PCP OFFICE

## 2022-02-21 ENCOUNTER — Encounter: Payer: Medicare HMO | Admitting: Physical Therapy

## 2022-02-22 ENCOUNTER — Other Ambulatory Visit: Payer: Self-pay | Admitting: Family Medicine

## 2022-02-25 LAB — CULTURE, BLOOD (ROUTINE X 2)
Culture: NO GROWTH
Culture: NO GROWTH
Special Requests: ADEQUATE
Special Requests: ADEQUATE

## 2022-02-26 ENCOUNTER — Ambulatory Visit: Payer: Medicare HMO | Attending: Orthopaedic Surgery | Admitting: Physical Therapy

## 2022-02-26 ENCOUNTER — Encounter: Payer: Self-pay | Admitting: Physical Therapy

## 2022-02-26 DIAGNOSIS — R2681 Unsteadiness on feet: Secondary | ICD-10-CM | POA: Diagnosis present

## 2022-02-26 DIAGNOSIS — M170 Bilateral primary osteoarthritis of knee: Secondary | ICD-10-CM | POA: Diagnosis present

## 2022-02-26 DIAGNOSIS — M6281 Muscle weakness (generalized): Secondary | ICD-10-CM | POA: Diagnosis present

## 2022-02-26 DIAGNOSIS — R262 Difficulty in walking, not elsewhere classified: Secondary | ICD-10-CM | POA: Diagnosis present

## 2022-02-26 NOTE — Therapy (Signed)
OUTPATIENT PHYSICAL THERAPY TREATMENT   Patient Name: Jessica Fry MRN: [id number] DOB:07-08-1956, 66 y.o., female Today's Date: 02/26/2022   PT End of Session - 02/26/22 1403     Visit Number 3    Number of Visits 12    Date for PT Re-Evaluation 04/06/22    Authorization Type [employer]    Progress Note Due on Visit 10    PT Start Time 1401    PT Stop Time 1439    PT Time Calculation (min) 38 min    Activity Tolerance Patient tolerated treatment well    Behavior During Therapy WFL for tasks assessed/performed              Past Medical History:  Diagnosis Date   Alcohol addiction ([place])    Allergy    Chicken pox    Depression    Eczema    GERD (gastroesophageal reflux disease)    Hyperlipidemia    Thyroid disease    Hypothyroid   Urine incontinence    Past Surgical History:  Procedure Laterality Date   nose-deviated septum injury  1972   Patient Active Problem List   Diagnosis Date Noted   Allergic rhinitis 10/26/2019   Alcohol dependence, in remission ([place]) 02/03/2019   Hyperlipidemia 08/26/2017   Major depression, recurrent ([place]) 08/26/2017   Primary hypothyroidism 08/26/2017    PCP: [place], Betty G, MD  REFERRING PROVIDER: Vanetta Mulders MD  REFERRING DIAG: M17.0 (ICD-10-CM) - Primary osteoarthritis of both knees  THERAPY DIAG:  Primary osteoarthritis of both knees  Unsteadiness on feet  Difficulty in walking, not elsewhere classified  Muscle weakness (generalized)  Rationale for Evaluation and Treatment Rehabilitation  ONSET DATE:  Injured January 2023 knee pain since that episode.   SUBJECTIVE:   SUBJECTIVE STATEMENT: PT reports she missed visits after she had to go out of town then got COVID couple weeks ago so has not been doing much.  Back of L knee was hurting but looked up and has been massaging popliteus which has helped.    PERTINENT HISTORY: 08/02/21- right lateral tibial plateau fracture, hypothyroidism.  Depression.  PAIN:  Are you having pain? Yes: NPRS scale: 0/10 Pain location: bil knees, 3/10 with standing up from low surface Pain description: aches Aggravating factors: stairs, standing up from low surface Relieving factors: rest  PRECAUTIONS: None  WEIGHT BEARING RESTRICTIONS No  FALLS:  Has patient fallen in last 6 months? No but feels unsteady  LIVING ENVIRONMENT: Lives with: lives alone Lives in: House/apartment Stairs: Yes: External: 3 steps; on right going up and no HR for back steps Has following equipment at home: None  OCCUPATION: retired  PLOF: [place] be able to hike, improve balance, be able to use exercise equipment has at home.    OBJECTIVE:   DIAGNOSTIC FINDINGS: 01/08/2022 X rays bilateral knees 4 views IMPRESSION: Mild degenerative joint changes of bilateral knees.  PATIENT SURVEYS:  LEFS 43/80= 54% ability or 46% disability  COGNITION:  Overall cognitive status: Within functional limits for tasks assessed     SENSATION: WFL  EDEMA:  NA  MUSCLE LENGTH: Hamstrings: Right -45 deg; Left -30 deg measured through popliteal angle Elys Test: Right 115 deg; Left 115 deg  noted femoral tension on R side.   POSTURE: forward head  PALPATION: Tenderness bil patellar tendons  LOWER EXTREMITY MMT:  MMT Right eval Left eval  Hip flexion 4+ 5  Hip extension 4 4+  Hip abduction 5 5  Hip adduction  5 5  Gluteus medius 4 4+  Knee flexion 5 5  Knee extension 5 5  Ankle dorsiflexion 5 5  Ankle plantarflexion     (Blank rows = not tested)  LOWER EXTREMITY ROM:  ROM Right eval Left eval  Knee flexion 122 122  Knee extension 0 0   (Blank rows = not tested)  LOWER EXTREMITY SPECIAL TESTS:  Knee special tests: Anterior drawer test: negative, Posterior drawer test: negative, and Lachman Test: negative  FUNCTIONAL TESTS:  5 times sit to stand: 17.5 seconds without UE assist, reports increased R knee pain.    Single leg  stance : L 7 sec, R 3 seconds.    FGA: 24/30    BERG: 50/56 (low fall risk) GAIT: Distance walked: 300' Assistive device utilized: None Level of assistance: Complete Independence Comments: lands very heavily on midfoot, wide BOS, feet in slight ER, gait speed 1.8ms, fast walk 1.55 m/s, reciprocal step ascending stairs, no HR, descending reciprocal step with HR, reports increased knee pain bil at patellar tendon descending stairs.     TODAY'S TREATMENT: 02/26/22 Therapeutic Exercise: to improve strength and mobility.  Demo, verbal and tactile cues throughout for technique. Nustep L5 x 6 min  At counter for safety: Standing heel raises x 15 Standing toe raises x 10 Standing hip extension x 10 bil  Standing hip abduction x 10 bil  Standing hip flexion x 10 bil  Squats 2 x 10  Step ups (no riser) 2 x 10 bil (started with 1 riser, noted poor eccentric control and pain).  Supine bridges 2 x 10  Prone leg extensions x 10 bil -cues to keep ankle flexed to avoid cramping.    01/31/22 Therapeutic Exercise: Nustep L2x655m STS x 10 LAQ 10x3" bil Standing L hip flexion x 10 RTB Standing L hip abduction x 10 RTB Standing L hip ext x 10 RTB Standing L hip ADD x 10 RTB  01/24/2022 Self Care: see patient education.  Prone leg extensions x 10 bil    PATIENT EDUCATION:  Education details: HEP update  Person educated: Patient Education method: Explanation, Demonstration, Verbal cues, and Handouts Education comprehension: verbalized understanding   HOME EXERCISE PROGRAM: Access Code: [email or web address]  update 02/26/2022  ASSESSMENT:  CLINICAL IMPRESSION: Jessica Fry returned today after almost month absence due to travel followed by COVID infection.  She tolerated review/progression of HEP for LE strengthening, but fatigued quickly and needed frequent rest breaks.  She reported increased pain and weakness with step down off 4" step.  Jessica Fry to demonstrate  potential for improvement and would benefit from continued skilled therapy to address impairments.  Due to absence extending POC to 04/06/22.      OBJECTIVE IMPAIRMENTS Abnormal gait, decreased activity tolerance, decreased balance, decreased mobility, difficulty walking, decreased strength, decreased safety awareness, increased fascial restrictions, impaired perceived functional ability, increased muscle spasms, impaired flexibility, and pain.   ACTIVITY LIMITATIONS carrying, lifting, bending, standing, squatting, stairs, transfers, and locomotion level  PARTICIPATION LIMITATIONS: cleaning, community activity, yard work, and hiking  PERSONAL FACTORS 1-2 comorbidities: bil knee OA, depression  are also affecting patient's functional outcome.   REHAB POTENTIAL: Good  CLINICAL DECISION MAKING: Evolving/moderate complexity  EVALUATION COMPLEXITY: Moderate   GOALS: Goals reviewed with patient? Yes  SHORT TERM GOALS: Target date: 02/07/2022   Patient will be independent with initial HEP. Baseline: given Goal status: MET - 01/31/22  2.  Complete Berg and mCTSIB Baseline: needs assessment Goal status: MET   LONG  TERM GOALS: Target date: 04/06/2022   Patient will be independent with advanced/ongoing HEP to improve outcomes and carryover.  Baseline: needs progression Goal status: IN PROGRESS  2.  Patient will report no pain in knees with sit to stands or stairs. Baseline: 3/10 bil knee pain with stairs and transfers Goal status: IN PROGRESS  3.  Patient will demonstrate decreased hamstring tightness by 15 deg bil for normal gait.  Baseline: see objective, unable to extend knees in sitting fully Goal status: IN PROGRESS  4.  Patient will demonstrate 5/5 bil LE strength for safety with gait.  Baseline: see objective, glut weakness bil Goal status: IN PROGRESS  5.  Patient will be able to maintain SLS x 15 second bil to decrease risk of falls.  Baseline: L 7 sec, R 3 seconds Goal  status: IN PROGRESS  6. Patient will be able to ascend/descend 2 flights of stairs without HR safely without increased pain to be able to go on hikes.  Baseline: increased pain bil knees, decreased eccentric control descending Goal status: IN PROGRESS  7.  Patient will report >52/80 on LEFS to demonstrate improved functional ability. Baseline: 43/80 = 46% disability  Goal status: IN PROGRESS  8.  Patient will demonstrate at least 50/56 on Berg to decrease risk of falls.  Baseline: 01/31/22- 50/56 Goal status: MET    PLAN: PT FREQUENCY: 1-2x/week  PT DURATION: 6 weeks extended to 04/06/2022   PLANNED INTERVENTIONS: Therapeutic exercises, Therapeutic activity, Neuromuscular re-education, Balance training, Gait training, Patient/Family education, Self Care, Joint mobilization, Stair training, Dry Needling, Electrical stimulation, Cryotherapy, Moist heat, Vasopneumatic device, Ultrasound, Ionotophoresis 85m/ml Dexamethasone, and Manual therapy  PLAN FOR NEXT SESSION: HEP for hip strengthening and HS stretches, manual therapy/modalities PRN.    ERennie Natter PT, DPT  02/26/2022, 3:48 PM

## 2022-02-28 ENCOUNTER — Ambulatory Visit: Payer: Medicare HMO

## 2022-03-06 ENCOUNTER — Ambulatory Visit: Payer: Medicare HMO | Admitting: Physical Therapy

## 2022-03-06 ENCOUNTER — Encounter: Payer: Self-pay | Admitting: Physical Therapy

## 2022-03-06 DIAGNOSIS — R262 Difficulty in walking, not elsewhere classified: Secondary | ICD-10-CM

## 2022-03-06 DIAGNOSIS — R2681 Unsteadiness on feet: Secondary | ICD-10-CM

## 2022-03-06 DIAGNOSIS — M170 Bilateral primary osteoarthritis of knee: Secondary | ICD-10-CM

## 2022-03-06 DIAGNOSIS — M6281 Muscle weakness (generalized): Secondary | ICD-10-CM

## 2022-03-06 NOTE — Therapy (Addendum)
PHYSICAL THERAPY DISCHARGE SUMMARY  Visits from Start of Care: 4  Current functional level related to goals / functional outcomes: Decreased knee pain.  Berg 50/56   Remaining deficits: Difficulty with SLS   Education / Equipment: HEP  Plan: Patient agrees to discharge.  Patient is being discharged due by request to HEP.    Jessica Fry, PT, DPT 9:42 AM 03/27/2022   OUTPATIENT PHYSICAL THERAPY TREATMENT   Patient Name: Jessica Fry MRN: [id number] DOB:April 22, 1956, 66 y.o., female Today's Date: 03/06/2022   PT End of Session - 03/06/22 1358     Visit Number 4    Number of Visits 12    Date for PT Re-Evaluation 04/06/22    Authorization Type [employer]    Progress Note Due on Visit 10    PT Start Time 1400    PT Stop Time 1445    PT Time Calculation (min) 45 min    Activity Tolerance Patient tolerated treatment well    Behavior During Therapy [facility] for tasks assessed/performed              Past Medical History:  Diagnosis Date   Alcohol addiction ([place])    Allergy    Chicken pox    Depression    Eczema    GERD (gastroesophageal reflux disease)    Hyperlipidemia    Thyroid disease    Hypothyroid   Urine incontinence    Past Surgical History:  Procedure Laterality Date   nose-deviated septum injury  1972   Patient Active Problem List   Diagnosis Date Noted   Allergic rhinitis 10/26/2019   Alcohol dependence, in remission ([place]) 02/03/2019   Hyperlipidemia 08/26/2017   Major depression, recurrent ([place]) 08/26/2017   Primary hypothyroidism 08/26/2017    PCP: Martinique, Betty G, MD  REFERRING PROVIDER: Vanetta Mulders MD  REFERRING DIAG: M17.0 (ICD-10-CM) - Primary osteoarthritis of both knees  THERAPY DIAG:  Primary osteoarthritis of both knees  Unsteadiness on feet  Difficulty walking, not elsewhere classified  Muscle Weakness (generalized)  Rationale for Evaluation and Treatment Rehabilitation  ONSET DATE:  Injured January  2023 knee pain since that episode.   SUBJECTIVE:   SUBJECTIVE STATEMENT: Jessica Fry reports knees are doing better, the one with the tight muscle is still a little tight but less.  Reports good compliance with exercises, brought her RTB with her today.    PERTINENT HISTORY: 08/02/21- right lateral tibial plateau fracture, hypothyroidism. Depression.  PAIN:  Are you having pain? Yes: NPRS scale: 0/10 Pain location: bil knees  PRECAUTIONS: None  WEIGHT BEARING RESTRICTIONS No  FALLS:  Has patient fallen in last 6 months? No but feels unsteady  LIVING ENVIRONMENT: Lives with: lives alone Lives in: House/apartment Stairs: Yes: External: 3 steps; on right going up and no HR for back steps Has following equipment at home: None  OCCUPATION: retired  PLOF: [place] be able to hike, improve balance, be able to use exercise equipment has at home.    OBJECTIVE:   DIAGNOSTIC FINDINGS: 01/08/2022 X rays bilateral knees 4 views IMPRESSION: Mild degenerative joint changes of bilateral knees.  PATIENT SURVEYS:  LEFS 43/80= 54% ability or 46% disability  COGNITION:  Overall cognitive status: Within functional limits for tasks assessed     SENSATION: WFL  EDEMA:  NA  MUSCLE LENGTH: Hamstrings: Right -45 deg; Left -30 deg measured through popliteal angle Elys Test: Right 115 deg; Left 115 deg  noted femoral tension on R side.  POSTURE: forward head  PALPATION: Tenderness bil patellar tendons  LOWER EXTREMITY MMT:  MMT Right eval Left eval  Hip flexion 4+ 5  Hip extension 4 4+  Hip abduction 5 5  Hip adduction 5 5  Gluteus medius 4 4+  Knee flexion 5 5  Knee extension 5 5  Ankle dorsiflexion 5 5  Ankle plantarflexion     (Blank rows = not tested)  LOWER EXTREMITY ROM:  ROM Right eval Left eval  Knee flexion 122 122  Knee extension 0 0   (Blank rows = not tested)  LOWER EXTREMITY SPECIAL TESTS:  Knee special tests: Anterior  drawer test: negative, Posterior drawer test: negative, and Lachman Test: negative  FUNCTIONAL TESTS:  5 times sit to stand: 17.5 seconds without UE assist, reports increased R knee pain.    Single leg stance : L 7 sec, R 3 seconds.    FGA: 24/30    BERG: 50/56 (low fall risk) GAIT: Distance walked: 300' Assistive device utilized: None Level of assistance: Complete Independence Comments: lands very heavily on midfoot, wide BOS, feet in slight ER, gait speed 1.63ms, fast walk 1.55 m/s, reciprocal step ascending stairs, no HR, descending reciprocal step with HR, reports increased knee pain bil at patellar tendon descending stairs.     TODAY'S TREATMENT: 03/06/2022 Therapeutic Exercise: to improve strength and mobility.  Demo, verbal and tactile cues throughout for technique. Nustep L5 x 6 min  Against wall-  -Toe raises 2 x 10  -wall squat with isometric hold 5 x 10 sec   Church pews x 20 - 2 posterior LOB   Calf stretches 3 x 30 sec each on step   Eccentric calf raises x 10   Single leg RDLS (forward T's) x 10 bil with 1UE support on counter Neuromuscular Reeducation: to improve balance and stability. SBA for safety throughout.  Star excursion pattern -toe taps to promote SLS 180 deg 12 to 6 o'clock, x 5 each side -forward step and reach with opposite arm raise 2 x 10 bil  Manual Therapy: to decrease muscle spasm and pain and improve mobility IASTM with massage stick to bil quads and calves.    02/26/22 Therapeutic Exercise: to improve strength and mobility.  Demo, verbal and tactile cues throughout for technique. Nustep L5 x 6 min  At counter for safety: Standing heel raises x 15 Standing toe raises x 10 Standing hip extension x 10 bil  Standing hip abduction x 10 bil  Standing hip flexion x 10 bil  Squats 2 x 10  Step ups (no riser) 2 x 10 bil (started with 1 riser, noted poor eccentric control and pain).  Supine bridges 2 x 10  Prone leg extensions x 10 bil -cues to keep  ankle flexed to avoid cramping.    01/31/22 Therapeutic Exercise: Nustep L2x612m STS x 10 LAQ 10x3" bil Standing L hip flexion x 10 RTB Standing L hip abduction x 10 RTB Standing L hip ext x 10 RTB Standing L hip ADD x 10 RTB  01/24/2022 Self Care: see patient education.  Prone leg extensions x 10 bil    PATIENT EDUCATION:  Education details: HEP update  Person educated: Patient Education method: Explanation, Demonstration, Verbal cues, and Handouts Education comprehension: verbalized understanding   HOME EXERCISE PROGRAM: Access Code: [email or web address]  update 02/26/2022  ASSESSMENT:  CLINICAL IMPRESSION: SuJustiss Gerbinourviance reports good compliance with HEP and decreased knee pain, primarily reporting tightness in calves today.  Noted more difficulty with L SLS with  forward stepping and forward T's.  Progressed HEP including calf stretch.  No report of knee pain with activities, brief IASTM at end of session to decrease muscle soreness.   Norell Brisbin continues to demonstrate potential for improvement and would benefit from continued skilled therapy to address impairments.        OBJECTIVE IMPAIRMENTS Abnormal gait, decreased activity tolerance, decreased balance, decreased mobility, difficulty walking, decreased strength, decreased safety awareness, increased fascial restrictions, impaired perceived functional ability, increased muscle spasms, impaired flexibility, and pain.   ACTIVITY LIMITATIONS carrying, lifting, bending, standing, squatting, stairs, transfers, and locomotion level  PARTICIPATION LIMITATIONS: cleaning, community activity, yard work, and hiking  PERSONAL FACTORS 1-2 comorbidities: bil knee OA, depression  are also affecting patient's functional outcome.   REHAB POTENTIAL: Good  CLINICAL DECISION MAKING: Evolving/moderate complexity  EVALUATION COMPLEXITY: Moderate   GOALS: Goals reviewed with patient? Yes  SHORT TERM GOALS: Target date: 02/07/2022    Patient will be independent with initial HEP. Baseline: given Goal status: MET - 01/31/22  2.  Complete Berg and mCTSIB Baseline: needs assessment Goal status: MET   LONG TERM GOALS: Target date: 04/06/2022   Patient will be independent with advanced/ongoing HEP to improve outcomes and carryover.  Baseline: needs progression Goal status: IN PROGRESS  2.  Patient will report no pain in knees with sit to stands or stairs. Baseline: 3/10 bil knee pain with stairs and transfers Goal status: IN PROGRESS  3.  Patient will demonstrate decreased hamstring tightness by 15 deg bil for normal gait.  Baseline: see objective, unable to extend knees in sitting fully Goal status: IN PROGRESS  4.  Patient will demonstrate 5/5 bil LE strength for safety with gait.  Baseline: see objective, glut weakness bil Goal status: IN PROGRESS  5.  Patient will be able to maintain SLS x 15 second bil to decrease risk of falls.  Baseline: L 7 sec, R 3 seconds Goal status: IN PROGRESS  6. Patient will be able to ascend/descend 2 flights of stairs without HR safely without increased pain to be able to go on hikes.  Baseline: increased pain bil knees, decreased eccentric control descending Goal status: IN PROGRESS  7.  Patient will report >52/80 on LEFS to demonstrate improved functional ability. Baseline: 43/80 = 46% disability  Goal status: IN PROGRESS  8.  Patient will demonstrate at least 50/56 on Berg to decrease risk of falls.  Baseline: 01/31/22- 50/56 Goal status: MET    PLAN: PT FREQUENCY: 1-2x/week  PT DURATION: 6 weeks extended to 04/06/2022   PLANNED INTERVENTIONS: Therapeutic exercises, Therapeutic activity, Neuromuscular re-education, Balance training, Gait training, Patient/Family education, Self Care, Joint mobilization, Stair training, Dry Needling, Electrical stimulation, Cryotherapy, Moist heat, Vasopneumatic device, Ultrasound, Ionotophoresis 33m/ml Dexamethasone, and Manual  therapy  PLAN FOR NEXT SESSION: continue to focus on balance and LE strengthening, stairs, manual therapy/modalities PRN.    ERennie Fry PT, DPT  03/06/2022, 3:04 PM

## 2022-03-14 ENCOUNTER — Ambulatory Visit: Payer: Medicare HMO

## 2022-03-18 ENCOUNTER — Other Ambulatory Visit: Payer: Self-pay | Admitting: Family Medicine

## 2022-03-20 ENCOUNTER — Encounter: Payer: Medicare HMO | Admitting: Physical Therapy

## 2022-03-27 ENCOUNTER — Telehealth: Payer: Self-pay | Admitting: Family Medicine

## 2022-03-27 ENCOUNTER — Other Ambulatory Visit: Payer: Self-pay | Admitting: Family Medicine

## 2022-03-27 MED ORDER — SIMVASTATIN 40 MG PO TABS: 40.0000 mg | ORAL_TABLET | Freq: Every day | ORAL | 0 refills | Status: DC

## 2022-03-27 NOTE — Telephone Encounter (Signed)
Pt called to request a refill of the following:  simvastatin (ZOCOR) 40 MG tablet  LVV:  07/24/21 LOV:  10/25/20  Pt has been scheduled for an OV on 04/02/22.  Please advise.  [id number] - [place],  - [address] Phone:  [phone]  Fax:  [phone]

## 2022-03-27 NOTE — Telephone Encounter (Signed)
Rx sent in

## 2022-03-29 ENCOUNTER — Encounter: Payer: Medicare HMO | Admitting: Physical Therapy

## 2022-04-02 ENCOUNTER — Ambulatory Visit: Payer: Medicare HMO | Admitting: Family Medicine

## 2022-04-04 NOTE — Progress Notes (Unsigned)
HPI: Jessica Fry is a 66 y.o. female, who is here today for follow up. Last visit, virtual,was 07/24/21.  Since her last visit she has been evaluated in the ED after fall on 02/05/22, left rib contusion; and 02/20/22 for COVID 19 infection. Completed PT for fall prevention and still doing exercises at home.  Depression on Effexor XR  75 mg daily, she still feels like med is helping. She is now working part time. Hx of alcohol dependency , has not had alcohol in years. Attends AA meetings.     04/06/2022   11:42 AM 05/10/2020    2:14 PM 02/09/2020    1:07 PM  Depression screen PHQ 2/9  Decreased Interest 1 1 1   Down, Depressed, Hopeless 0 2 1  PHQ - 2 Score 1 3 2   Altered sleeping 0 2 0  Tired, decreased energy 0 2 0  Change in appetite 1 1 1   Feeling bad or failure about yourself  0 1 0  Trouble concentrating 0 1 0  Moving slowly or fidgety/restless 0 1 1  Suicidal thoughts 0 0 0  PHQ-9 Score 2 11 4   Difficult doing work/chores Not difficult at all Very difficult Not difficult at all   HLD on Simvastatin 40 mg daily. She has tolerated medications well. She cooks at home, tries to avoid fried foods. She loves sweets. Component     Latest Ref Rng 02/09/2020  Cholesterol     0 - 200 mg/dL   Triglycerides     0.0 - 149.0 mg/dL (H)   HDL Cholesterol     >39.00 mg/dL 60   Total CHOL/HDL Ratio 3.1   LDL Cholesterol (Calc)     mg/dL (calc) 99   Non-HDL Cholesterol (Calc)     <130 mg/dL (calc)     Glucose elevated at 133 in 02/2022. No hx of diabetes. Denies abdominal pain, nausea,vomiting, polydipsia,polyuria, or polyphagia.  Today she is c/o 3 months of puffiness of lower eye lids, R>L, inner aspect. When she applies pressure on nasal angle tears come out and edema improves temporarily. She has not identified exacerbating factors. Conjunctival erythema and pruritus. Negative for eye pain,visual changes,or purulent drainage.  Hypothyroidism: She is  on Levothyroxine 100 mcg daily. Last TSH 1.4 in 02/2020.  Review of Systems  Constitutional:  Negative for activity change, appetite change and fever.  HENT:  Negative for mouth sores, nosebleeds and trouble swallowing.   Respiratory:  Negative for cough, shortness of breath and wheezing.   Cardiovascular:  Negative for chest pain, palpitations and leg swelling.  Gastrointestinal:        Negative for changes in bowel habits.  Endocrine: Negative for cold intolerance and heat intolerance.  Allergic/Immunologic: Positive for environmental allergies.  Neurological:  Negative for syncope, weakness and headaches.  Psychiatric/Behavioral:  Negative for confusion and hallucinations.   Rest see pertinent positives and negatives per HPI.  Current Outpatient Medications on File Prior to Visit  Medication Sig Dispense Refill   BABY ASPIRIN PO Take 81 mg by mouth daily.      Cholecalciferol (VITAMIN D3 PO) Take 1 tablet by mouth daily.      clotrimazole-betamethasone (LOTRISONE) cream Apply 1 application topically daily as needed. (Patient not taking: Reported on 01/24/2022) 45 g 0   diclofenac Sodium (VOLTAREN) 1 % GEL Apply 4 g topically 4 (four) times daily. 50 g 3   levothyroxine (SYNTHROID) 100 MCG tablet TAKE 1 AND 1/2 TABLETS BY MOUTH MONDAY AND WEDNESDAY  AND 1 TABLET BY MOUTH ALL OTHER DAYS 135 tablet 1   nystatin (MYCOSTATIN/NYSTOP) powder Apply 1 application topically 2 (two) times daily as needed. (Patient not taking: Reported on 01/24/2022) 60 g 3   No current facility-administered medications on file prior to visit.   Past Medical History:  Diagnosis Date   Alcohol addiction (HCC)    Allergy    Chicken pox    Depression    Eczema    GERD (gastroesophageal reflux disease)    Hyperlipidemia    Thyroid disease    Hypothyroid   Urine incontinence    Allergies  Allergen Reactions   Penicillins Hives    Did it involve swelling of the face/tongue/throat, SOB, or low BP? No Did it  involve sudden or severe rash/hives, skin peeling, or any reaction on the inside of your mouth or nose? Yes Did you need to seek medical attention at a hospital or doctor's office? No When did it last happen?unk       If all above answers are "NO", may proceed with cephalosporin use.     Social History   Socioeconomic History   Marital status: Divorced    Spouse name: Not on file   Number of children: 2   Years of education: Not on file   Highest education level: Doctorate  Occupational History   Not on file  Tobacco Use   Smoking status: Never   Smokeless tobacco: Never  Vaping Use   Vaping Use: Never used  Substance and Sexual Activity   Alcohol use: No    Comment: 7 years sober   Drug use: No   Sexual activity: Never    Comment: both previously  Other Topics Concern   Not on file  Social History Narrative   Not on file   Social Determinants of Health   Financial Resource Strain: Low Risk  (07/20/2021)   Overall Financial Resource Strain (CARDIA)    Difficulty of Paying Living Expenses: Not hard at all  Food Insecurity: No Food Insecurity (07/20/2021)   Hunger Vital Sign    Worried About Running Out of Food in the Last Year: Never true    Ran Out of Food in the Last Year: Never true  Transportation Needs: No Transportation Needs (07/20/2021)   PRAPARE - Administrator, Civil Service (Medical): No    Lack of Transportation (Non-Medical): No  Physical Activity: Insufficiently Active (07/20/2021)   Exercise Vital Sign    Days of Exercise per Week: 2 days    Minutes of Exercise per Session: 30 min  Stress: No Stress Concern Present (07/20/2021)   [employer] of Occupational Health - Occupational Stress Questionnaire    Feeling of Stress : Not at all  Social Connections: Moderately Integrated (07/20/2021)   Social Connection and Isolation Panel [NHANES]    Frequency of Communication with Friends and Family: More than three times a week    Frequency of  Social Gatherings with Friends and Family: Twice a week    Attends Religious Services: More than 4 times per year    Active Member of [employer] or Organizations: Yes    Attends Banker Meetings: More than 4 times per year    Marital Status: Divorced   Vitals:   04/06/22 1140  BP: 128/88  Pulse: 67  Resp: 12  Temp: 97.9 F (36.6 C)  SpO2: 95%   Wt Readings from Last 3 Encounters:  04/06/22 214 lb (97.1 kg)  02/20/22 205 lb (93  kg)  10/25/20 210 lb (95.3 kg)   Body mass index is 33.52 kg/m.  Physical Exam Vitals and nursing note reviewed.  Constitutional:      General: She is not in acute distress.    Appearance: She is well-developed and well-groomed.  HENT:     Head: Normocephalic and atraumatic.     Mouth/Throat:     Mouth: Mucous membranes are moist.     Pharynx: Oropharynx is clear.  Eyes:     General:        Right eye: No foreign body or hordeolum.        Left eye: No foreign body or hordeolum.     Extraocular Movements:     Right eye: Normal extraocular motion.     Left eye: Normal extraocular motion.     Conjunctiva/sclera: Conjunctivae normal.      Comments: Lower eye lid mild edema bilateral. + Epiphora.  Cardiovascular:     Rate and Rhythm: Normal rate and regular rhythm.     Pulses:          Posterior tibial pulses are 2+ on the right side and 2+ on the left side.     Heart sounds: No murmur heard. Pulmonary:     Effort: Pulmonary effort is normal. No respiratory distress.     Breath sounds: Normal breath sounds.  Abdominal:     Palpations: Abdomen is soft. There is no hepatomegaly or mass.     Tenderness: There is no abdominal tenderness.  Lymphadenopathy:     Cervical: No cervical adenopathy.  Skin:    General: Skin is warm.     Findings: No erythema or rash.  Neurological:     General: No focal deficit present.     Mental Status: She is alert and oriented to person, place, and time.     Cranial Nerves: No cranial nerve deficit.      Gait: Gait normal.  Psychiatric:        Mood and Affect: Mood and affect normal.   ASSESSMENT AND PLAN:  Ms.Delesia was seen today for medication refill.  Diagnoses and all orders for this visit: Orders Placed This Encounter  Procedures   Hemoglobin A1c   TSH   Lipid panel   Lab Results  Component Value Date   CHOL 166 04/06/2022   HDL 55.00 04/06/2022   LDLCALC 78 04/06/2022   TRIG 164.0 (H) 04/06/2022   CHOLHDL 3 04/06/2022   Lab Results  Component Value Date   TSH 1.31 04/06/2022   Lab Results  Component Value Date   HGBA1C 5.8 04/06/2022   Right epiphora We discussed possible etiologies. She has had some conjunctival erythema and pruritus ,so could be allergic conjunctivitis. Lacrimonasal duct obstruction also to be considered. She agrees with trying topical antihistaminic. Recommend arranging an appt with eye care provider.  -     Olopatadine HCl (PAZEO) 0.7 % SOLN; Apply 1 drop to eye daily.  Hyperglycemia Consistency with a healthy life style encouraged for diabetes prevention. Further recommendations according to HgA1C result.  Primary hypothyroidism Stable. No changes in Levothyroxine dose. F/U in 12 months.  Hyperlipidemia, unspecified hyperlipidemia type Continue Simvastatin same dose and low fat diet. Further recommendations according to lipid panel.  -     simvastatin (ZOCOR) 40 MG tablet; Take 1 tablet (40 mg total) by mouth daily at 6 PM.  Mild episode of recurrent major depressive disorder (HCC) Well controlled. Continue same dose Effexor XR.  -  venlafaxine XR (EFFEXOR-XR) 75 MG 24 hr capsule; Take 1 capsule (75 mg total) by mouth daily with breakfast.  Alcohol dependence, in remission ([place]) Continue avoiding alcohol intake and attending [place] meetings.  Class 1 obesity due to excess calories with serious comorbidity and body mass index (BMI) of 33.0 to 33.9 in adult She understands the benefits of wt loss as well as adverse effects of  obesity. Consistency with healthy diet and physical activity encouraged.  Return in about 1 year (around 04/07/2023) for CPE and f/u.  Nastasha Reising G. Martinique, MD  [facility]. [place] office.

## 2022-04-05 ENCOUNTER — Encounter: Payer: Medicare HMO | Admitting: Physical Therapy

## 2022-04-06 ENCOUNTER — Encounter: Payer: Self-pay | Admitting: Family Medicine

## 2022-04-06 ENCOUNTER — Ambulatory Visit (INDEPENDENT_AMBULATORY_CARE_PROVIDER_SITE_OTHER): Payer: Medicare HMO | Admitting: Family Medicine

## 2022-04-06 VITALS — BP 128/88 | HR 67 | Temp 97.9°F | Resp 12 | Ht 67.0 in | Wt 214.0 lb

## 2022-04-06 DIAGNOSIS — H04201 Unspecified epiphora, right lacrimal gland: Secondary | ICD-10-CM | POA: Diagnosis not present

## 2022-04-06 DIAGNOSIS — E039 Hypothyroidism, unspecified: Secondary | ICD-10-CM | POA: Diagnosis not present

## 2022-04-06 DIAGNOSIS — E785 Hyperlipidemia, unspecified: Secondary | ICD-10-CM

## 2022-04-06 DIAGNOSIS — E6609 Other obesity due to excess calories: Secondary | ICD-10-CM

## 2022-04-06 DIAGNOSIS — F1021 Alcohol dependence, in remission: Secondary | ICD-10-CM

## 2022-04-06 DIAGNOSIS — F330 Major depressive disorder, recurrent, mild: Secondary | ICD-10-CM

## 2022-04-06 DIAGNOSIS — E669 Obesity, unspecified: Secondary | ICD-10-CM | POA: Insufficient documentation

## 2022-04-06 DIAGNOSIS — Z6833 Body mass index (BMI) 33.0-33.9, adult: Secondary | ICD-10-CM

## 2022-04-06 DIAGNOSIS — R739 Hyperglycemia, unspecified: Secondary | ICD-10-CM

## 2022-04-06 DIAGNOSIS — H04203 Unspecified epiphora, bilateral lacrimal glands: Secondary | ICD-10-CM

## 2022-04-06 DIAGNOSIS — E66811 Obesity, class 1: Secondary | ICD-10-CM | POA: Insufficient documentation

## 2022-04-06 LAB — LIPID PANEL
Cholesterol: 166 mg/dL (ref 0–200)
HDL: 55 mg/dL (ref >39.00)
LDL Cholesterol: 78 mg/dL (ref 0–99)
NonHDL: 111.03
Total CHOL/HDL Ratio: 3
Triglycerides: 164 mg/dL — ABNORMAL HIGH (ref 0.0–149.0)
VLDL: 32.8 mg/dL (ref 0.0–40.0)

## 2022-04-06 LAB — HEMOGLOBIN A1C: Hgb A1c MFr Bld: 5.8 % (ref 4.6–6.5)

## 2022-04-06 LAB — TSH: TSH: 1.31 u[IU]/mL (ref 0.35–5.50)

## 2022-04-06 MED ORDER — VENLAFAXINE HCL ER 75 MG PO CP24: 75.0000 mg | ORAL_CAPSULE | Freq: Every day | ORAL | 3 refills | Status: DC

## 2022-04-06 MED ORDER — SIMVASTATIN 40 MG PO TABS: 40.0000 mg | ORAL_TABLET | Freq: Every day | ORAL | 3 refills | Status: DC

## 2022-04-06 MED ORDER — PAZEO 0.7 % OP SOLN: 1.0000 [drp] | Freq: Every day | OPHTHALMIC | 2 refills | Status: DC

## 2022-04-06 NOTE — Patient Instructions (Addendum)
A few things to remember from today's visit:  Hyperglycemia - Plan: Hemoglobin A1c  Primary hypothyroidism - Plan: TSH  Hyperlipidemia, unspecified hyperlipidemia type - Plan: Lipid panel  Mild episode of recurrent major depressive disorder ([place]) - Plan: venlafaxine XR (EFFEXOR-XR) 75 MG 24 hr capsule  Watery eyes  Watery eyes could be allergies. Apply eye drops once daily. Please arrange appt with eye care provider. Collect cologuard sample.  No changes in rest today. Caution with sweets intake, moderation.  If you need refills for medications you take chronically, please call your pharmacy. Do not use My Chart to request refills or for acute issues that need immediate attention. If you send a my chart message, it may take a few days to be addressed, specially if I am not in the office.  Please be sure medication list is accurate. If a new problem present, please set up appointment sooner than planned today.

## 2022-05-03 ENCOUNTER — Telehealth: Payer: Self-pay | Admitting: Family Medicine

## 2022-05-03 NOTE — Telephone Encounter (Signed)
Spoke with patient to schedule Medicare Annual Wellness Visit (AWV) either virtually or in office.  She stated her schedule changed and would like a call back around 11/10  Last WTM 06/08/20  due for AWVI  please schedule with Nurse Health Adviser   45 min for awv-i and in office appointments 30 min for awv-s  phone/virtual appointments

## 2022-05-18 ENCOUNTER — Telehealth: Payer: Self-pay | Admitting: Family Medicine

## 2022-05-18 NOTE — Telephone Encounter (Signed)
Left message for patient to call back and schedule Medicare Annual Wellness Visit (AWV) either virtually or in office. Left  my jabber number [phone]   Last WTM 06/08/20 please schedule with Nurse Health Adviser   45 min for awv-i and in office appointments 30 min for awv-s  phone/virtual appointments  

## 2022-06-13 ENCOUNTER — Telehealth: Payer: Medicare HMO | Admitting: Family Medicine

## 2022-06-18 ENCOUNTER — Telehealth: Payer: Self-pay | Admitting: Family Medicine

## 2022-06-18 NOTE — Telephone Encounter (Signed)
Left message for patient to call back and schedule Medicare Annual Wellness Visit (AWV) either virtually or in office. Left  my Jessica Fry number [phone]   Last WTM 06/08/20 please schedule with Nurse Health Adviser   45 min for awv-i and in office appointments 30 min for awv-s  phone/virtual appointments

## 2022-07-06 NOTE — Progress Notes (Unsigned)
ACUTE VISIT No chief complaint on file.  HPI: Jessica Fry is a 66 y.o. female, who is here today complaining of *** HPI  Review of Systems See other pertinent positives and negatives in HPI.  Current Outpatient Medications on File Prior to Visit  Medication Sig Dispense Refill   BABY ASPIRIN PO Take 81 mg by mouth daily.      Cholecalciferol (VITAMIN D3 PO) Take 1 tablet by mouth daily.      clotrimazole-betamethasone (LOTRISONE) cream Apply 1 application topically daily as needed. (Patient not taking: Reported on 01/24/2022) 45 g 0   diclofenac Sodium (VOLTAREN) 1 % GEL Apply 4 g topically 4 (four) times daily. 50 g 3   levothyroxine (SYNTHROID) 100 MCG tablet TAKE 1 AND 1/2 TABLETS BY MOUTH MONDAY AND WEDNESDAY AND 1 TABLET BY MOUTH ALL OTHER DAYS 135 tablet 1   nystatin (MYCOSTATIN/NYSTOP) powder Apply 1 application topically 2 (two) times daily as needed. (Patient not taking: Reported on 01/24/2022) 60 g 3   Olopatadine HCl (PAZEO) 0.7 % SOLN Apply 1 drop to eye daily. 2.5 mL 2   simvastatin (ZOCOR) 40 MG tablet Take 1 tablet (40 mg total) by mouth daily at 6 PM. 90 tablet 3   venlafaxine XR (EFFEXOR-XR) 75 MG 24 hr capsule Take 1 capsule (75 mg total) by mouth daily with breakfast. 90 capsule 3   No current facility-administered medications on file prior to visit.    Past Medical History:  Diagnosis Date   Alcohol addiction (HCC)    Allergy    Chicken pox    Depression    Eczema    GERD (gastroesophageal reflux disease)    Hyperlipidemia    Thyroid disease    Hypothyroid   Urine incontinence    Allergies  Allergen Reactions   Penicillins Hives    Did it involve swelling of the face/tongue/throat, SOB, or low BP? No Did it involve sudden or severe rash/hives, skin peeling, or any reaction on the inside of your mouth or nose? Yes Did you need to seek medical attention at a hospital or doctor's office? No When did it last happen?unk       If all above  answers are "NO", may proceed with cephalosporin use.     Social History   Socioeconomic History   Marital status: Divorced    Spouse name: Not on file   Number of children: 2   Years of education: Not on file   Highest education level: Doctorate  Occupational History   Not on file  Tobacco Use   Smoking status: Never   Smokeless tobacco: Never  Vaping Use   Vaping Use: Never used  Substance and Sexual Activity   Alcohol use: No    Comment: 7 years sober   Drug use: No   Sexual activity: Never    Comment: both previously  Other Topics Concern   Not on file  Social History Narrative   Not on file   Social Determinants of Health   Financial Resource Strain: Low Risk  (07/20/2021)   Overall Financial Resource Strain (CARDIA)    Difficulty of Paying Living Expenses: Not hard at all  Food Insecurity: No Food Insecurity (07/20/2021)   Hunger Vital Sign    Worried About Running Out of Food in the Last Year: Never true    Ran Out of Food in the Last Year: Never true  Transportation Needs: No Transportation Needs (07/20/2021)   PRAPARE - Transportation    Lack  of Transportation (Medical): No    Lack of Transportation (Non-Medical): No  Physical Activity: Insufficiently Active (07/20/2021)   Exercise Vital Sign    Days of Exercise per Week: 2 days    Minutes of Exercise per Session: 30 min  Stress: No Stress Concern Present (07/20/2021)   [employer] of Occupational Health - Occupational Stress Questionnaire    Feeling of Stress : Not at all  Social Connections: Moderately Integrated (07/20/2021)   Social Connection and Isolation Panel [NHANES]    Frequency of Communication with Friends and Family: More than three times a week    Frequency of Social Gatherings with Friends and Family: Twice a week    Attends Religious Services: More than 4 times per year    Active Member of [employer] or Organizations: Yes    Attends Engineer, structural: More than 4 times per year     Marital Status: Divorced    There were no vitals filed for this visit. There is no height or weight on file to calculate BMI.  Physical Exam  ASSESSMENT AND PLAN: There are no diagnoses linked to this encounter.  No follow-ups on file.  Betty G. [place], MD  [facility]. [place] office.  Discharge Instructions   None

## 2022-07-08 ENCOUNTER — Encounter: Payer: Self-pay | Admitting: Family Medicine

## 2022-07-10 ENCOUNTER — Ambulatory Visit: Payer: Medicare HMO

## 2022-07-10 ENCOUNTER — Encounter: Payer: Self-pay | Admitting: Family Medicine

## 2022-07-10 ENCOUNTER — Ambulatory Visit (INDEPENDENT_AMBULATORY_CARE_PROVIDER_SITE_OTHER): Payer: Medicare HMO | Admitting: Family Medicine

## 2022-07-10 VITALS — BP 132/80 | HR 70 | Temp 97.6°F | Resp 16 | Ht 67.0 in | Wt 212.0 lb

## 2022-07-10 DIAGNOSIS — M545 Low back pain, unspecified: Secondary | ICD-10-CM | POA: Diagnosis not present

## 2022-07-10 DIAGNOSIS — E785 Hyperlipidemia, unspecified: Secondary | ICD-10-CM

## 2022-07-10 DIAGNOSIS — M79605 Pain in left leg: Secondary | ICD-10-CM | POA: Diagnosis not present

## 2022-07-10 DIAGNOSIS — Z23 Encounter for immunization: Secondary | ICD-10-CM

## 2022-07-10 LAB — BASIC METABOLIC PANEL
BUN: 14 mg/dL (ref 6–23)
CO2: 24 mEq/L (ref 19–32)
Calcium: 10.1 mg/dL (ref 8.4–10.5)
Chloride: 104 mEq/L (ref 96–112)
Creatinine, Ser: 0.89 mg/dL (ref 0.40–1.20)
GFR: 66.8 mL/min (ref >60.00)
Glucose, Bld: 100 mg/dL — ABNORMAL HIGH (ref 70–99)
Potassium: 4.2 mEq/L (ref 3.5–5.1)
Sodium: 139 mEq/L (ref 135–145)

## 2022-07-10 NOTE — Telephone Encounter (Signed)
Has appt today

## 2022-07-10 NOTE — Patient Instructions (Signed)
A few things to remember from today's visit:  Pain of left lower extremity - Plan: VAS [place] ABI WITH/WO TBI, Basic metabolic panel  Bilateral low back pain, unspecified chronicity, unspecified whether sciatica present - Plan: DG Lumbar Spine Complete  Leg pain could be muscular, pinched nerve in ypour lower back, spinal stenosis,or vascular. Today I ordered X ray of your lower back and circulation test. We are also checking electrolytes. Remember to arrange mammogram and send cologuard.  If you need refills for medications you take chronically, please call your pharmacy. Do not use My Chart to request refills or for acute issues that need immediate attention. If you send a my chart message, it may take a few days to be addressed, specially if I am not in the office.  Please be sure medication list is accurate. If a new problem present, please set up appointment sooner than planned today.

## 2022-07-10 NOTE — Telephone Encounter (Signed)
Okay to order? Last OV 03/2022

## 2022-07-12 ENCOUNTER — Ambulatory Visit (HOSPITAL_COMMUNITY)
Admission: RE | Admit: 2022-07-12 | Discharge: 2022-07-12 | Disposition: A | Discharge status: Home / Self Care | Payer: Medicare HMO | Source: Ambulatory Visit | Attending: Family Medicine | Admitting: Family Medicine

## 2022-07-12 ENCOUNTER — Ambulatory Visit (HOSPITAL_BASED_OUTPATIENT_CLINIC_OR_DEPARTMENT_OTHER): Payer: Medicare HMO | Admitting: Orthopaedic Surgery

## 2022-07-12 DIAGNOSIS — M79605 Pain in left leg: Secondary | ICD-10-CM | POA: Diagnosis not present

## 2022-07-19 ENCOUNTER — Other Ambulatory Visit: Payer: Medicare HMO

## 2022-07-19 ENCOUNTER — Ambulatory Visit (INDEPENDENT_AMBULATORY_CARE_PROVIDER_SITE_OTHER): Payer: Medicare HMO

## 2022-07-19 DIAGNOSIS — M545 Low back pain, unspecified: Secondary | ICD-10-CM | POA: Diagnosis not present

## 2022-07-22 ENCOUNTER — Encounter: Payer: Self-pay | Admitting: Family Medicine

## 2022-07-22 DIAGNOSIS — M79605 Pain in left leg: Secondary | ICD-10-CM

## 2022-07-22 DIAGNOSIS — M545 Low back pain, unspecified: Secondary | ICD-10-CM

## 2022-08-16 ENCOUNTER — Telehealth (INDEPENDENT_AMBULATORY_CARE_PROVIDER_SITE_OTHER): Payer: Medicare HMO | Admitting: Family Medicine

## 2022-08-16 ENCOUNTER — Other Ambulatory Visit: Payer: Self-pay

## 2022-08-16 DIAGNOSIS — Z0000 Encounter for general adult medical examination without abnormal findings: Secondary | ICD-10-CM

## 2022-08-16 DIAGNOSIS — Z1231 Encounter for screening mammogram for malignant neoplasm of breast: Secondary | ICD-10-CM

## 2022-08-16 NOTE — Progress Notes (Signed)
PATIENT CHECK-IN and HEALTH RISK ASSESSMENT QUESTIONNAIRE:  -completed by phone/video for upcoming Medicare Preventive Visit  Pre-Visit Check-in: 1)Vitals (height, wt, BP, etc) - record in vitals section for visit on day of visit 2)Review and Update Medications, Allergies PMH, Surgeries, Social history in Epic 3)Hospitalizations in the last year with date/reason?  No 4)Review and Update Care Team (patient's specialists) in Epic 5) Complete PHQ9 in Epic  6) Complete Fall Screening in Epic 7)Review all Health Maintenance Due and order under PCP if not done.  8)Medicare Wellness Questionnaire: Answer theses question about your habits: Do you drink alcohol? No If yes, how many drinks do you have a day?No Have you ever smoked?No  Quit date if applicable?  n/a How many packs a day do/did you smoke? n/a Do you use smokeless tobacco?no Do you use an illicit drugs?no Do you exercises? Yes IF so, what type and how many days/minutes per week?2 times per week with physical therapy 40 mins and is doing some exercise at home. She grows a garden in the summer.  Are you sexually active? No Number of partners? 0 She admits diet is not fresh - but lately she has been trying to get more fresh veggies and fruits, grows some of her own veggies, is cutting down on the amount of meat she is eating Typical breakfast:Cereal with milk,toast Typical lunch:Tv dinner Typical dinner: Tv dinner, vegs  Typical snacks:cookies, fruit  Beverages: coffee, water, tea  Answer theses question about you: Can you perform most household chores?yes Do you find it hard to follow a conversation in a noisy room?Sometimes Do you often ask people to speak up or repeat themselves?No  Do you feel that you have a problem with memory?No Do you balance your checkbook and or bank acounts?No, look at online account Do you feel safe at home?Yes Last dentist visit?Jan 2024 Do you need assistance with any of the following: Please note  if so No assistane  Driving?  Feeding yourself?  Getting from bed to chair?  Getting to the toilet?  Bathing or showering?  Dressing yourself?  Managing money?  Climbing a flight of stairs  Preparing meals?  Do you have Advanced Directives in place (Living Will, Healthcare Power or Attorney)? No   Last eye Exam and location?Jan 2024, [place] could not remember name of office   Do you currently use prescribed or non-prescribed narcotic or opioid pain medications?Advil, prn. Diclofenac Sodium Gel 1%  Do you have a history or close family history of breast, ovarian, tubal or peritoneal cancer or a family member with BRCA (breast cancer susceptibility 1 and 2) gene mutations?  Nurse/Assistant Credentials/time stamp:   ----------------------------------------------------------------------------------------------------------------------------------------------------------------------------------------------------------------------   MEDICARE ANNUAL PREVENTIVE VISIT WITH PROVIDER: (Welcome to [employer], initial annual wellness or annual wellness exam)  Virtual Visit via Video Note  I connected with Jessica Fry  on 08/16/22 by a video enabled telemedicine application and verified that I am speaking with the correct person using two identifiers.  Location patient: home Location provider:work or home office Persons participating in the virtual visit: patient, provider  Concerns and/or follow up today: none, doing PT and is improving.    See HM section in Epic for other details of completed HM.    ROS: negative for report of fevers, unintentional weight loss, vision changes, vision loss, hearing loss or change, chest pain, sob, hemoptysis, melena, hematochezia, hematuria, genital discharge or lesions, falls, bleeding or bruising, loc, thoughts of suicide or self harm, memory loss  Patient-completed extensive health risk  assessment - reviewed and discussed with the patient: See Health Risk  Assessment completed with patient prior to the visit either above or in recent phone note. This was reviewed in detailed with the patient today and appropriate recommendations, orders and referrals were placed as needed per Summary below and patient instructions.   Review of Medical History: -PMH, PSH, Family History and current specialty and care providers reviewed and updated and listed below   Patient Care Team: [place], Betty G, MD as PCP - General (Family Medicine)   Past Medical History:  Diagnosis Date   Alcohol addiction ([place])    Allergy    Chicken pox    Depression    Eczema    GERD (gastroesophageal reflux disease)    Hyperlipidemia    Thyroid disease    Hypothyroid   Urine incontinence     Past Surgical History:  Procedure Laterality Date   nose-deviated septum injury  1972    Social History   Socioeconomic History   Marital status: Divorced    Spouse name: Not on file   Number of children: 2   Years of education: Not on file   Highest education level: Doctorate  Occupational History   Not on file  Tobacco Use   Smoking status: Never   Smokeless tobacco: Never  Vaping Use   Vaping Use: Never used  Substance and Sexual Activity   Alcohol use: No    Comment: 7 years sober   Drug use: No   Sexual activity: Never    Comment: both previously  Other Topics Concern   Not on file  Social History Narrative   Not on file   Social Determinants of Health   Financial Resource Strain: Low Risk  (07/20/2021)   Overall Financial Resource Strain (CARDIA)    Difficulty of Paying Living Expenses: Not hard at all  Food Insecurity: No Food Insecurity (07/20/2021)   Hunger Vital Sign    Worried About Running Out of Food in the Last Year: Never true    [place] in the Last Year: Never true  Transportation Needs: No Transportation Needs (07/20/2021)   PRAPARE - Hydrologist (Medical): No    Lack of Transportation (Non-Medical): No   Physical Activity: Insufficiently Active (07/20/2021)   Exercise Vital Sign    Days of Exercise per Week: 2 days    Minutes of Exercise per Session: 30 min  Stress: No Stress Concern Present (07/20/2021)   [place]    Feeling of Stress : Not at all  Social Connections: Moderately Integrated (07/20/2021)   Social Connection and Isolation Panel [NHANES]    Frequency of Communication with Friends and Family: More than three times a week    Frequency of Social Gatherings with Friends and Family: Twice a week    Attends Religious Services: More than 4 times per year    Active Member of [employer] or Organizations: Yes    Attends Music therapist: More than 4 times per year    Marital Status: Divorced  Human resources officer Violence: Not on file    Family History  Problem Relation Age of Onset   Early death Mother    Miscarriages / [place] Mother    Alcohol abuse Father    Depression Father    Hearing loss Father    Stroke Father    Early death Brother    Heart attack Maternal Grandmother  Stroke Paternal Grandmother     Current Outpatient Medications on File Prior to Visit  Medication Sig Dispense Refill   BABY ASPIRIN PO Take 81 mg by mouth daily.      Cholecalciferol (VITAMIN D3 PO) Take 1 tablet by mouth daily.      diclofenac Sodium (VOLTAREN) 1 % GEL Apply 4 g topically 4 (four) times daily. 50 g 3   levothyroxine (SYNTHROID) 100 MCG tablet TAKE 1 AND 1/2 TABLETS BY MOUTH MONDAY AND WEDNESDAY AND 1 TABLET BY MOUTH ALL OTHER DAYS 135 tablet 1   Olopatadine HCl (PAZEO) 0.7 % SOLN Apply 1 drop to eye daily. 2.5 mL 2   simvastatin (ZOCOR) 40 MG tablet Take 1 tablet (40 mg total) by mouth daily at 6 PM. 90 tablet 3   venlafaxine XR (EFFEXOR-XR) 75 MG 24 hr capsule Take 1 capsule (75 mg total) by mouth daily with breakfast. 90 capsule 3   No current facility-administered medications on file prior to visit.     No Active Allergies     Physical Exam There were no vitals filed for this visit. Estimated body mass index is 33.2 kg/m as calculated from the following:   Height as of 07/10/22: 5\' 7"  (1.702 m).   Weight as of 07/10/22: 212 lb (96.2 kg).  EKG (optional): deferred due to virtual visit  GENERAL: alert, oriented, no acute distress detected, full vision exam deferred due to pandemic and/or virtual encounter  HEENT: atraumatic, conjunttiva clear, no obvious abnormalities on inspection of external nose and ears  NECK: normal movements of the head and neck  LUNGS: on inspection no signs of respiratory distress, breathing rate appears normal, no obvious gross SOB, gasping or wheezing  CV: no obvious cyanosis  MS: moves all visible extremities without noticeable abnormality  PSYCH/NEURO: pleasant and cooperative, no obvious depression or anxiety, speech and thought processing grossly intact, Cognitive function grossly intact  Flowsheet Row Video Visit from 08/16/2022 in [place] at [place]  PHQ-9 Total Score 1           08/16/2022    3:15 PM 07/10/2022   12:07 PM 04/06/2022   11:42 AM 05/10/2020    2:14 PM 02/09/2020    1:07 PM  Depression screen PHQ 2/9  Decreased Interest 1 2 1 1 1   Down, Depressed, Hopeless 0 0 0 2 1  PHQ - 2 Score 1 2 1 3 2   Altered sleeping 0 0 0 2 0  Tired, decreased energy 0 0 0 2 0  Change in appetite 0 2 1 1 1   Feeling bad or failure about yourself  0 1 0 1 0  Trouble concentrating 0 1 0 1 0  Moving slowly or fidgety/restless 0 0 0 1 1  Suicidal thoughts 0 0 0 0 0  PHQ-9 Score 1 6 2 11 4   Difficult doing work/chores Not difficult at all Somewhat difficult Not difficult at all Very difficult Not difficult at all       07/20/2021    8:51 AM 02/20/2022    5:46 PM 04/06/2022   11:44 AM 07/10/2022   12:08 PM 08/16/2022    3:12 PM  Fall Risk  Falls in the past year? 0  1 0 1  Was there an injury with Fall?   1 0 0  Fall Risk  Category Calculator   3 0 1  Fall Risk Category (Retired)   High Low   (RETIRED) Patient Fall Risk Level  Low fall risk  Low fall risk   Patient at Risk for Falls Due to    Other (Comment)   Fall risk Follow up    Falls evaluation completed      SUMMARY AND PLAN:  Medicare annual wellness visit, subsequent    Discussed applicable health maintenance/preventive health measures and advised and referred or ordered per patient preferences:  Health Maintenance  Topic Date Due   DTaP/Tdap/Td (1 - Tdap) Never done, reports had it done in the last 10 years but doesn't have the record   Fecal DNA (Cologuard)  Never done, reports has the kit at home and plans to complete   Zoster Vaccines- Shingrix (1 of 2) Thinks had but does not have record   MAMMOGRAM  01/15/2020, sent message to staff to order through PCP, advised pt to contact office in 1 week if has not been contacted regarding this.   COVID-19 Vaccine (4 - 2023-24 season) 03/09/2022, she reports she plans to do this   INFLUENZA VACCINE  10/07/2022 (Originally 02/06/2022)   DEXA SCAN  08/17/2023 (Originally 07/03/2019), declined, reports she is doing weight bearing exercise, take vit D and consumes adequate calcium   Medicare Annual Wellness (AWV)  08/17/2023   Pneumonia Vaccine 85+ Years old  Completed   Hepatitis C Screening  Completed   HPV VACCINES  Aged Out  Education and counseling on the following was provided based on the above review of health and a plan/checklist for the patient, along with additional information discussed, was provided for the patient in the patient instructions :  -Advised on importance of and resources for completing advanced directives - info provided in pt instructions as well -Advised and counseled on maintaining ealthy lifestyle - including the importance of a healthy diet, regular physical activity -Advised and counseled on a whole foods based healthy diet and regular exercise: discussed a heart healthy  whole foods based diet at length. A summary of a healthy diet was provided in the Patient Instructions. Recommended regular exercise and discussed guidelines and provided info for options at home and within the community.  -Advise yearly dental visits at minimum and regular eye exams   Follow up: see patient instructions     Patient Instructions  I really enjoyed getting to talk with you today! I am available on Tuesdays and Thursdays for virtual visits if you have any questions or concerns, or if I can be of any further assistance.   CHECKLIST FROM ANNUAL WELLNESS VISIT:  -Follow up (please call to schedule if not scheduled after visit):   -yearly for annual wellness visit with primary care office  Here is a list of your preventive care/health maintenance measures and the plan for each if any are due:  Health Maintenance  Topic Date Due   DTaP/Tdap/Td (1 - Tdap) No record, please provide record if possible   Zoster Vaccines- Shingrix (1 of 2) No record, please provide record if posibble   MAMMOGRAM  01/15/2020, sent message to staff to order, please call the office in 1 week if you have not been contacted to schedule.    COVID-19 Vaccine (4 - 2023-24 season) 03/09/2022   INFLUENZA VACCINE  10/07/2022 (Originally 02/06/2022)   DEXA SCAN  08/17/2023 (Originally 07/03/2019), declined today, please let us know if you wish to do this   Cologuard test for colon cancer screening Please complete and mail in test asap   Medicare Annual Wellness (AWV)  08/17/2023   Pneumonia Vaccine 98+ Years old  Completed   Hepatitis  C Screening  Completed   HPV VACCINES  Aged Out    -See a dentist at least yearly  -Get your eyes checked and then per your eye specialist's recommendations  -Other issues addressed today:  -I have included below further information regarding a healthy whole foods based diet, physical activity guidelines for adults, stress management and opportunities for social  connections. I hope you find this information useful.   -----------------------------------------------------------------------------------------------------------------------------------------------------------------------------------------------------------------------------------------------------------  NUTRITION: -eat real food: lots of colorful vegetables (half the plate) and fruits -5-7 servings of vegetables and fruits per day (fresh or steamed is best), exp. 2 servings of vegetables with lunch and dinner and 2 servings of fruit per day. Berries and greens such as kale and collards are great choices.  -consume on a regular basis: whole grains (make sure first ingredient on label contains the word "whole"), fresh fruits, fish, nuts, seeds, healthy oils (such as olive oil, avocado oil, grape seed oil) -may eat small amounts of dairy and lean meat on occasion, but avoid processed meats such as ham, bacon, lunch meat, etc. -drink water -try to avoid fast food and pre-packaged foods, processed meat -most experts advise limiting sodium to < 2300mg  per day, should limit further is any chronic conditions such as high blood pressure, heart disease, diabetes, etc. The American Heart Association advised that < 1500mg  is is ideal -try to avoid foods that contain any ingredients with names you do not recognize  -try to avoid sugar/sweets (except for the natural sugar that occurs in fresh fruit) -try to avoid sweet drinks -try to avoid white rice, white bread, pasta (unless whole grain), white or yellow potatoes  EXERCISE GUIDELINES FOR ADULTS: -if you wish to increase your physical activity, do so gradually and with the approval of your doctor -STOP and seek medical care immediately if you have any chest pain, chest discomfort or trouble breathing when starting or increasing exercise  -move and stretch your body, legs, feet and arms when sitting for long periods -Physical activity guidelines for  optimal health in adults: -least 150 minutes per week of aerobic exercise (can talk, but not sing) once approved by your doctor, 20-30 minutes of sustained activity or two 10 minute episodes of sustained activity every day.  -resistance training at least 2 days per week if approved by your doctor -balance exercises 3+ days per week:   Stand somewhere where you have something sturdy to hold onto if you lose balance.    1) lift up on toes, start with 5x per day and work up to 20x   2) stand and lift on leg straight out to the side so that foot is a few inches of the floor, start with 5x each side and work up to 20x each side   3) stand on one foot, start with 5 seconds each side and work up to 20 seconds on each side  If you need ideas or help with getting more active:  -Silver sneakers https://tools.silversneakers.com  -Walk with a Doc:  -try to include resistance (weight lifting/strength building) and balance exercises twice per week: or the following link for ideas:  http://www.duncan-williams.com/  STRESS MANAGEMENT: -can try meditating, or just sitting quietly with deep breathing while intentionally relaxing all parts of your body for 5 minutes daily -if you need further help with stress, anxiety or depression please follow up with your primary doctor or contact the wonderful folks at http://[email or web address]-[email or web address]/ Health: [phone]  SOCIAL CONNECTIONS: -options in [place] if you wish  to engage in more social and exercise related activities:  -Silver sneakers https://tools.silversneakers.com  -Walk with a Doc: http://stephens-thompson.biz/  -Check out the [place] 50+ section on the [place] [employer] (hiking clubs, book clubs, cards and games, chess, exercise classes, aquatic classes and much more) - see the website for  details: https://www.[place].gov/departments/parks-recreation/active-adults50  -YouTube has lots of exercise videos for different ages and abilities as well  -[place] (a variety of indoor and outdoor inperson activities for adults). [phone]. [address]  -Virtual Online Classes (a variety of topics): see seniorplanet.org or call [phone]  -consider volunteering at a school, hospice center, church, senior center or elsewhere   ADVANCED HEALTHCARE DIRECTIVES:  Everyone should have advanced health care directives in place. This is so that you get the care you want, should you ever be in a situation where you are unable to make your own medical decisions.   From the [place] Advanced Directive Website: "[place] are legal documents in which you give written instructions about your health care if, in the future, you cannot speak for yourself.   A health care power of attorney allows you to name a person you trust to make your health care decisions if you cannot make them yourself. A declaration of a desire for a natural death (or living will) is document, which states that you desire not to have your life prolonged by extraordinary measures if you have a terminal or incurable illness or if you are in a vegetative state. An advance instruction for mental health treatment makes a declaration of instructions, information and preferences regarding your mental health treatment. It also states that you are aware that the advance instruction authorizes a mental health treatment provider to act according to your wishes. It may also outline your consent or refusal of mental health treatment. A declaration of an anatomical gift allows anyone over the age of 36 to make a gift by will, organ donor card or other document."   Please see the following website or an elder law attorney for forms, FAQs and for completion of advanced directives: [place] Secretary of [place] ([email or web address])  Or copy and paste the following to your web browser: [email or web address]          Lucretia Kern, DO

## 2022-08-16 NOTE — Patient Instructions (Addendum)
I really enjoyed getting to talk with you today! I am available on Tuesdays and Thursdays for virtual visits if you have any questions or concerns, or if I can be of any further assistance.   CHECKLIST FROM ANNUAL WELLNESS VISIT:  -Follow up (please call to schedule if not scheduled after visit):   -yearly for annual wellness visit with primary care office  Here is a list of your preventive care/health maintenance measures and the plan for each if any are due:  Health Maintenance  Topic Date Due   DTaP/Tdap/Td (1 - Tdap) No record, please provide record if possible   Zoster Vaccines- Shingrix (1 of 2) No record, please provide record if posibble   MAMMOGRAM  01/15/2020, sent message to staff to order, please call the office in 1 week if you have not been contacted to schedule.    COVID-19 Vaccine (4 - 2023-24 season) 03/09/2022   INFLUENZA VACCINE  10/07/2022 (Originally 02/06/2022)   DEXA SCAN  08/17/2023 (Originally 07/03/2019), declined today, please let us know if you wish to do this   Cologuard test for colon cancer screening Please complete and mail in test asap   Medicare Annual Wellness (AWV)  08/17/2023   Pneumonia Vaccine 23+ Years old  Completed   Hepatitis C Screening  Completed   HPV VACCINES  Aged Out    -See a dentist at least yearly  -Get your eyes checked and then per your eye specialist's recommendations  -Other issues addressed today:  -I have included below further information regarding a healthy whole foods based diet, physical activity guidelines for adults, stress management and opportunities for social connections. I hope you find this information useful.   -----------------------------------------------------------------------------------------------------------------------------------------------------------------------------------------------------------------------------------------------------------  NUTRITION: -eat real food: lots of colorful vegetables  (half the plate) and fruits -5-7 servings of vegetables and fruits per day (fresh or steamed is best), exp. 2 servings of vegetables with lunch and dinner and 2 servings of fruit per day. Berries and greens such as kale and collards are great choices.  -consume on a regular basis: whole grains (make sure first ingredient on label contains the word "whole"), fresh fruits, fish, nuts, seeds, healthy oils (such as olive oil, avocado oil, grape seed oil) -may eat small amounts of dairy and lean meat on occasion, but avoid processed meats such as ham, bacon, lunch meat, etc. -drink water -try to avoid fast food and pre-packaged foods, processed meat -most experts advise limiting sodium to < 2300mg  per day, should limit further is any chronic conditions such as high blood pressure, heart disease, diabetes, etc. The American Heart Association advised that < 1500mg  is is ideal -try to avoid foods that contain any ingredients with names you do not recognize  -try to avoid sugar/sweets (except for the natural sugar that occurs in fresh fruit) -try to avoid sweet drinks -try to avoid white rice, white bread, pasta (unless whole grain), white or yellow potatoes  EXERCISE GUIDELINES FOR ADULTS: -if you wish to increase your physical activity, do so gradually and with the approval of your doctor -STOP and seek medical care immediately if you have any chest pain, chest discomfort or trouble breathing when starting or increasing exercise  -move and stretch your body, legs, feet and arms when sitting for long periods -Physical activity guidelines for optimal health in adults: -least 150 minutes per week of aerobic exercise (can talk, but not sing) once approved by your doctor, 20-30 minutes of sustained activity or two 10 minute episodes of sustained activity  every day.  -resistance training at least 2 days per week if approved by your doctor -balance exercises 3+ days per week:   Stand somewhere where you have  something sturdy to hold onto if you lose balance.    1) lift up on toes, start with 5x per day and work up to 20x   2) stand and lift on leg straight out to the side so that foot is a few inches of the floor, start with 5x each side and work up to 20x each side   3) stand on one foot, start with 5 seconds each side and work up to 20 seconds on each side  If you need ideas or help with getting more active:  -Silver sneakers https://tools.silversneakers.com  -Walk with a Doc: http://stephens-thompson.biz/  -try to include resistance (weight lifting/strength building) and balance exercises twice per week: or the following link for ideas: ChessContest.fr  UpdateClothing.com.cy  STRESS MANAGEMENT: -can try meditating, or just sitting quietly with deep breathing while intentionally relaxing all parts of your body for 5 minutes daily -if you need further help with stress, anxiety or depression please follow up with your primary doctor or contact the wonderful folks at [place]: [place]: -options in [place] if you wish to engage in more social and exercise related activities:  -Silver sneakers https://tools.silversneakers.com  -Walk with a Doc: http://stephens-thompson.biz/  -Check out the [place] 50+ section on the [employer] [employer] (hiking clubs, book clubs, cards and games, chess, exercise classes, aquatic classes and much more) - see the website for details: https://www.[place].gov/departments/parks-recreation/active-adults50  -YouTube has lots of exercise videos for different ages and abilities as well  -[place] (a variety of indoor and outdoor inperson activities for adults). [phone]. [address].  -Virtual Online Classes (a variety of topics): see seniorplanet.org or call  [phone]  -consider volunteering at a school, hospice center, church, senior center or elsewhere   ADVANCED HEALTHCARE DIRECTIVES:  Everyone should have advanced health care directives in place. This is so that you get the care you want, should you ever be in a situation where you are unable to make your own medical decisions.   From the [place] Advanced Directive Website: "[place] are legal documents in which you give written instructions about your health care if, in the future, you cannot speak for yourself.   A health care power of attorney allows you to name a person you trust to make your health care decisions if you cannot make them yourself. A declaration of a desire for a natural death (or living will) is document, which states that you desire not to have your life prolonged by extraordinary measures if you have a terminal or incurable illness or if you are in a vegetative state. An advance instruction for mental health treatment makes a declaration of instructions, information and preferences regarding your mental health treatment. It also states that you are aware that the advance instruction authorizes a mental health treatment provider to act according to your wishes. It may also outline your consent or refusal of mental health treatment. A declaration of an anatomical gift allows anyone over the age of 18 to make a gift by will, organ donor card or other document."   Please see the following website or an elder law attorney for forms, FAQs and for completion of advanced directives: [place] ([email or web address].no)  Or  copy and paste the following to your web browser: [email or web address].cy

## 2022-11-17 ENCOUNTER — Other Ambulatory Visit: Payer: Self-pay | Admitting: Family Medicine

## 2022-11-17 DIAGNOSIS — E039 Hypothyroidism, unspecified: Secondary | ICD-10-CM
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
## 2022-12-04 ENCOUNTER — Ambulatory Visit (INDEPENDENT_AMBULATORY_CARE_PROVIDER_SITE_OTHER): Payer: Medicare HMO | Admitting: Family Medicine

## 2022-12-04 ENCOUNTER — Encounter: Payer: Self-pay | Admitting: Family Medicine

## 2022-12-04 ENCOUNTER — Telehealth: Payer: Self-pay | Admitting: Family Medicine

## 2022-12-04 VITALS — BP 124/80 | HR 70 | Temp 98.0°F | Resp 16 | Ht 67.0 in | Wt 211.0 lb

## 2022-12-04 DIAGNOSIS — R030 Elevated blood-pressure reading, without diagnosis of hypertension: Secondary | ICD-10-CM

## 2022-12-04 DIAGNOSIS — T23201D Burn of second degree of right hand, unspecified site, subsequent encounter: Secondary | ICD-10-CM

## 2022-12-04 NOTE — Telephone Encounter (Signed)
Pt states she contacted:  [facility]  And was told they will be unable to see her, as they do not work on the hands. Pt is asking for another referral, to a location that does address hand issues.
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
## 2023-03-05 ENCOUNTER — Other Ambulatory Visit: Payer: Self-pay | Admitting: Family Medicine

## 2023-03-05 DIAGNOSIS — E039 Hypothyroidism, unspecified: Secondary | ICD-10-CM

## 2023-03-15 ENCOUNTER — Telehealth: Payer: Self-pay | Admitting: Family Medicine

## 2023-03-15 DIAGNOSIS — F330 Major depressive disorder, recurrent, mild: Secondary | ICD-10-CM

## 2023-03-15 MED ORDER — VENLAFAXINE HCL ER 75 MG PO CP24: 75.0000 mg | ORAL_CAPSULE | Freq: Every day | ORAL | 0 refills | Status: DC

## 2023-03-15 NOTE — Telephone Encounter (Signed)
Rx sent 

## 2023-03-15 NOTE — Telephone Encounter (Signed)
Pt is aware refill up can take up to 3 business day and pt is out of town and would like #4 venlafaxine XR (EFFEXOR-XR) 75 MG 24 hr capsule send to  [facility] [address], [place] - [address] Phone: [phone]  Fax: [phone]

## 2023-04-02 ENCOUNTER — Other Ambulatory Visit: Payer: Self-pay

## 2023-04-02 ENCOUNTER — Encounter: Payer: Self-pay | Admitting: Allergy and Immunology

## 2023-04-02 ENCOUNTER — Ambulatory Visit: Payer: Medicare HMO | Admitting: Allergy and Immunology

## 2023-04-02 VITALS — BP 108/72 | HR 63 | Temp 98.0°F | Resp 16 | Ht 66.5 in | Wt 211.2 lb

## 2023-04-02 DIAGNOSIS — J301 Allergic rhinitis due to pollen: Secondary | ICD-10-CM | POA: Diagnosis not present

## 2023-04-02 DIAGNOSIS — H1010 Acute atopic conjunctivitis, unspecified eye: Secondary | ICD-10-CM

## 2023-04-02 DIAGNOSIS — R238 Other skin changes: Secondary | ICD-10-CM | POA: Diagnosis not present

## 2023-04-02 DIAGNOSIS — J3089 Other allergic rhinitis: Secondary | ICD-10-CM

## 2023-04-02 DIAGNOSIS — H1013 Acute atopic conjunctivitis, bilateral: Secondary | ICD-10-CM | POA: Diagnosis not present

## 2023-04-02 MED ORDER — LOTEPREDNOL ETABONATE 0.5 % OP SUSP: 1.0000 [drp] | Freq: Every day | OPHTHALMIC | 1 refills | Status: DC

## 2023-04-02 MED ORDER — LORATADINE 10 MG PO TABS: 10.0000 mg | ORAL_TABLET | Freq: Every day | ORAL | 1 refills | Status: DC | PRN

## 2023-04-02 NOTE — Patient Instructions (Signed)
  1. Allergen avoidance measures  2. Never, ever rub or touch eye  2. Treat and prevent nasal / eye inflammation:   A. Flonase Sensimist - 1 spray each nostril 1 time per day  B. Pataday - 1 drop each eye 1 time per day  C. Lotemax - 1 drop each eye 1 time per day  3. If needed:   A. Claritin / loratadine 10 mg - 1 tablet 1 time per day  4. Can use head and shoulders shampoo.  5. Return to clinic in 4 weeks or earlier if problem  6. Plan for fall flu vaccine

## 2023-04-02 NOTE — Progress Notes (Unsigned)
[place] - [place] - [place] - [place] - [place]   Dear [place],  Thank you for referring Aniya Milford to the [facility] on 04/02/2023.   Below is a summation of this patient's evaluation and recommendations.  Thank you for your referral. I will keep you informed about this patient's response to treatment.   If you have any questions please do not hesitate to contact me.   Sincerely,  Jessica Priest, MD Allergy / Immunology [facility]   ______________________________________________________________________    NEW PATIENT NOTE  Referring Provider: [place], Betty G, MD Primary Provider: [place], Betty G, MD Date of office visit: 04/02/2023    Subjective:   Chief Complaint:  Jessica Fry (DOB: [birth year]) is a 67 y.o. female who presents to the clinic on 04/02/2023 with a chief complaint of Pruritus and Eye Problem .     HPI: Fannie Knee presents to this clinic in evaluation of several issues.  First, she has recurrent eye swelling and eye itchiness and rubbing of her eyes and blurriness of her eyes and watering of her eyes that appears to occur on a perennial basis and does not really respond to much therapy.  She is not sure if there is an obvious provoking factor giving rise to this issue.  She also has nasal congestion and sneezing but that nasal issue appears to be under pretty good control when she uses Claritin.  Second, she has an itchy dry scaly scalp with lots of dandruff.  She has tried T-Gel.  Past Medical History:  Diagnosis Date  . Alcohol addiction (HCC)   . Allergy   . Chicken pox   . Depression   . Eczema   . GERD (gastroesophageal reflux disease)   . Hyperlipidemia   . Thyroid disease    Hypothyroid  . Urine incontinence     Past Surgical History:  Procedure Laterality Date  . nose-deviated septum injury  1972    Allergies as of  04/02/2023   No Known Allergies      Medication List    BABY ASPIRIN PO Take 81 mg by mouth daily.   diclofenac Sodium 1 % Gel Commonly known as: Voltaren Apply 4 g topically 4 (four) times daily.   levothyroxine 100 MCG tablet Commonly known as: SYNTHROID TAKE 1 AND 1/2 TABLETS BY MOUTH MONDAY AND WEDNESDAY AND 1 TABLET BY MOUTH ALL OTHER DAYS   MAGNESIUM PO Take by mouth daily as needed (constipation relief).   simvastatin 40 MG tablet Commonly known as: ZOCOR Take 1 tablet (40 mg total) by mouth daily at 6 PM.   venlafaxine XR 75 MG 24 hr capsule Commonly known as: EFFEXOR-XR Take 1 capsule (75 mg total) by mouth daily with breakfast.   VITAMIN D3 PO Take 1 tablet by mouth daily.    Review of systems negative except as noted in HPI / PMHx or noted below:  Review of Systems  Constitutional: Negative.   HENT: Negative.    Eyes: Negative.   Respiratory: Negative.    Cardiovascular: Negative.   Gastrointestinal: Negative.   Genitourinary: Negative.   Musculoskeletal: Negative.   Skin: Negative.   Neurological: Negative.   Endo/Heme/Allergies: Negative.   Psychiatric/Behavioral: Negative.      Family History  Problem Relation Age of Onset  . Early death Mother   . Miscarriages / [place] Mother   . Alcohol abuse Father   . Depression Father   .  Hearing loss Father   . Stroke Father   . Early death Brother   . Heart attack Maternal Grandmother   . Stroke Paternal Grandmother     Social History   Socioeconomic History  . Marital status: Divorced    Spouse name: Not on file  . Number of children: 2  . Years of education: Not on file  . Highest education level: Doctorate  Occupational History  . Not on file  Tobacco Use  . Smoking status: Never    Passive exposure: Never  . Smokeless tobacco: Never  Vaping Use  . Vaping status: Never Used  Substance and Sexual Activity  . Alcohol use: No    Comment: 7 years sober  . Drug use: No  .  Sexual activity: Never    Comment: both previously  Other Topics Concern  . Not on file  Social History Narrative  . Not on file   Social Determinants of Health   Financial Resource Strain: Low Risk  (11/30/2022)   Overall Financial Resource Strain (CARDIA)   . Difficulty of Paying Living Expenses: Not hard at all  Food Insecurity: No Food Insecurity (11/30/2022)   Hunger Vital Sign   . Worried About Programme researcher, broadcasting/film/video in the Last Year: Never true   . Ran Out of Food in the Last Year: Never true  Transportation Needs: Unmet Transportation Needs (11/30/2022)   PRAPARE - Transportation   . Lack of Transportation (Medical): No   . Lack of Transportation (Non-Medical): Yes  Physical Activity: Insufficiently Active (11/30/2022)   Exercise Vital Sign   . Days of Exercise per Week: 2 days   . Minutes of Exercise per Session: 20 min  Stress: No Stress Concern Present (11/30/2022)   [employer] of Occupational Health - Occupational Stress Questionnaire   . Feeling of Stress : Not at all  Social Connections: Moderately Integrated (11/30/2022)   Social Connection and Isolation Panel [NHANES]   . Frequency of Communication with Friends and Family: Three times a week   . Frequency of Social Gatherings with Friends and Family: Once a week   . Attends Religious Services: More than 4 times per year   . Active Member of Clubs or Organizations: Yes   . Attends Banker Meetings: More than 4 times per year   . Marital Status: Divorced  Intimate Partner Violence: Unknown (04/15/2022)   Received from [facility]   HITS   . Physically Hurt: Not on file   . Insult or Talk Down To: Not on file   . Threaten Physical Harm: Not on file   . Scream or Curse: Not on file    Environmental and Social history  Lives in a house with a dry environment, cat and dog located inside the household, a damp basement treated with plastic coverings, no carpet in the bedroom, no plastic  on the bed, no plastic on the pillow, no smoking ongoing with inside the household.  Objective:   Vitals:   04/02/23 0959  BP: 108/72  Pulse: 63  Resp: 16  Temp: 98 F (36.7 C)  SpO2: 96%   Height: 5' 6.5" (168.9 cm) Weight: 211 lb 3.2 oz (95.8 kg)  Physical Exam Constitutional:      Appearance: She is not diaphoretic.     Comments: Allergic shiners, rubbing of eyes  HENT:     Head: Normocephalic.     Right Ear: Tympanic membrane, ear canal and external ear normal.  Left Ear: Tympanic membrane, ear canal and external ear normal.     Nose: Nose normal. No mucosal edema or rhinorrhea.     Mouth/Throat:     Pharynx: Uvula midline. No oropharyngeal exudate.  Eyes:     Conjunctiva/sclera: Conjunctivae normal.  Neck:     Thyroid: No thyromegaly.     Trachea: Trachea normal. No tracheal tenderness or tracheal deviation.  Cardiovascular:     Rate and Rhythm: Normal rate and regular rhythm.     Heart sounds: Normal heart sounds, S1 normal and S2 normal. No murmur heard. Pulmonary:     Effort: No respiratory distress.     Breath sounds: Normal breath sounds. No stridor. No wheezing or rales.  Lymphadenopathy:     Head:     Right side of head: No tonsillar adenopathy.     Left side of head: No tonsillar adenopathy.     Cervical: No cervical adenopathy.  Skin:    Findings: No erythema or rash.     Nails: There is no clubbing.  Neurological:     Mental Status: She is alert.    Diagnostics: Allergy skin tests were performed.   Spirometry was performed and demonstrated an FEV1 of *** @ *** % of predicted. FEV1/FVC = ***  The patient had an Asthma Control Test with the following results:  .     Assessment and Plan:    No diagnosis found.  Patient Instructions   1. Allergen avoidance measures  2. Never, ever rub or touch eye  2. Treat and prevent nasal / eye inflammation:   A. Flonase Sensimist - 1 spray each nostril 1 time per day  B. Pataday - 1 drop each  eye 1 time per day  C. Lotemax - 1 drop each eye 1 time per day  3. If needed:   A. Claritin / loratadine 10 mg - 1 tablet 1 time per day  4. Can use head and shoulders shampoo.  5. Return to clinic in 4 weeks or earlier if problem  6. Plan for fall flu vaccine   Jessica Priest, MD Allergy / Immunology [facility]

## 2023-04-03 ENCOUNTER — Encounter: Payer: Self-pay | Admitting: Allergy and Immunology

## 2023-04-05 ENCOUNTER — Other Ambulatory Visit: Payer: Self-pay | Admitting: Family Medicine

## 2023-04-05 DIAGNOSIS — F330 Major depressive disorder, recurrent, mild: Secondary | ICD-10-CM

## 2023-04-05 DIAGNOSIS — E785 Hyperlipidemia, unspecified: Secondary | ICD-10-CM

## 2023-04-09 ENCOUNTER — Encounter: Payer: Self-pay | Admitting: Family Medicine

## 2023-04-09 ENCOUNTER — Telehealth: Payer: Medicare HMO | Admitting: Family Medicine

## 2023-04-09 VITALS — Ht 66.5 in

## 2023-04-09 DIAGNOSIS — L509 Urticaria, unspecified: Secondary | ICD-10-CM

## 2023-04-09 DIAGNOSIS — T63421A Toxic effect of venom of ants, accidental (unintentional), initial encounter: Secondary | ICD-10-CM

## 2023-04-09 MED ORDER — TRIAMCINOLONE ACETONIDE 0.1 % EX CREA
1.0000 | TOPICAL_CREAM | Freq: Two times a day (BID) | CUTANEOUS | 0 refills | Status: AC
Start: 2023-04-09 — End: 2023-04-23

## 2023-04-09 MED ORDER — PREDNISONE 20 MG PO TABS
40.0000 mg | ORAL_TABLET | Freq: Every day | ORAL | 0 refills | Status: AC
Start: 2023-04-09 — End: 2023-04-14

## 2023-04-09 MED ORDER — NYSTATIN-TRIAMCINOLONE 100000-0.1 UNIT/GM-% EX CREA
1.0000 | TOPICAL_CREAM | Freq: Two times a day (BID) | CUTANEOUS | 0 refills | Status: AC | PRN
Start: 2023-04-09 — End: 2023-04-23

## 2023-04-09 NOTE — Progress Notes (Signed)
Virtual Visit via Video Note I connected with Jessica Fry on 04/09/2023 by a video enabled telemedicine application and verified that I am speaking with the correct person using two identifiers. Location patient: home Location provider:work or home office Persons participating in the virtual visit: patient, scribe,provider  I discussed the limitations of evaluation and management by telemedicine and the availability of in person appointments. The patient expressed understanding and agreed to proceed.  Chief Complaint  Patient presents with   fire ant    Bites about a week & a half ago.    Jessica Fry is a 67 y.o. female with a PMHx significant for HLD, hypothyroidism, and depression who is being seen today because of fire ant bites.   She is reporting fire ant bites on her right foot about two weeks ago. She was walking her dog outside, barefoot, accidentally stepped on a and nest. She developed blisters on her foot, and after scratching them, they spread to places where she was not bitten. She feels the blisters are improving in general, but she is still getting some smaller new ones.  States that she went to see an allergist about a week ago, and told them about the ant bites. She had allergy testing done, patches on back and after her appointment, she began developing some hives.  Dx'ed with allergic conjunctivitis: She was prescribed Pataday eye drops 1 drop per eye per day and Loteprednol eye drops 1 drop per eye per day, and advised to take OTC Claritin, and Flonase.  She is not taking Claritin at this time.  3 days ago, she developed swelling, pruritus, and erythema on her right hip. She states the swelling has gone down.  She denies fever, chills, dysphagia, stridor, cough, SOB, wheezing, nausea, changes in bowel habits, or urinary symptoms.   ROS: See pertinent positives and negatives per HPI.  Past Medical History:  Diagnosis Date   Alcohol addiction (HCC)     Allergy    Chicken pox    Depression    Eczema    GERD (gastroesophageal reflux disease)    Hyperlipidemia    Thyroid disease    Hypothyroid   Urine incontinence     Past Surgical History:  Procedure Laterality Date   nose-deviated septum injury  1972    Family History  Problem Relation Age of Onset   Early death Mother    Miscarriages / [place] Mother    Alcohol abuse Father    Depression Father    Hearing loss Father    Stroke Father    Early death Brother    Heart attack Maternal Grandmother    Stroke Paternal Grandmother    Social History   Socioeconomic History   Marital status: Divorced    Spouse name: Not on file   Number of children: 2   Years of education: Not on file   Highest education level: Doctorate  Occupational History   Not on file  Tobacco Use   Smoking status: Never    Passive exposure: Never   Smokeless tobacco: Never  Vaping Use   Vaping status: Never Used  Substance and Sexual Activity   Alcohol use: No    Comment: 7 years sober   Drug use: No   Sexual activity: Never    Comment: both previously  Other Topics Concern   Not on file  Social History Narrative   Not on file   Social Determinants of Health   Financial Resource Strain: Low Risk  (  11/30/2022)   Overall Financial Resource Strain (CARDIA)    Difficulty of Paying Living Expenses: Not hard at all  Food Insecurity: No Food Insecurity (11/30/2022)   Hunger Vital Sign    Worried About Running Out of Food in the Last Year: Never true    Ran Out of Food in the Last Year: Never true  Transportation Needs: Unmet Transportation Needs (11/30/2022)   PRAPARE - Transportation    Lack of Transportation (Medical): No    Lack of Transportation (Non-Medical): Yes  Physical Activity: Insufficiently Active (11/30/2022)   Exercise Vital Sign    Days of Exercise per Week: 2 days    Minutes of Exercise per Session: 20 min  Stress: No Stress Concern Present (11/30/2022)   [employer] of Occupational Health - Occupational Stress Questionnaire    Feeling of Stress : Not at all  Social Connections: Moderately Integrated (11/30/2022)   Social Connection and Isolation Panel [NHANES]    Frequency of Communication with Friends and Family: Three times a week    Frequency of Social Gatherings with Friends and Family: Once a week    Attends Religious Services: More than 4 times per year    Active Member of [employer] or Organizations: Yes    Attends Engineer, structural: More than 4 times per year    Marital Status: Divorced  Intimate Partner Violence: Unknown (04/15/2022)   Received from [employer], [facility]   HITS    Physically Hurt: Not on file    Insult or Talk Down To: Not on file    Threaten Physical Harm: Not on file    Scream or Curse: Not on file    Current Outpatient Medications:    BABY ASPIRIN PO, Take 81 mg by mouth daily. , Disp: , Rfl:    Cholecalciferol (VITAMIN D3 PO), Take 1 tablet by mouth daily. , Disp: , Rfl:    levothyroxine (SYNTHROID) 100 MCG tablet, TAKE 1 AND 1/2 TABLETS BY MOUTH MONDAY AND WEDNESDAY AND 1 TABLET BY MOUTH ALL OTHER DAYS, Disp: 135 tablet, Rfl: 1   loratadine (CLARITIN) 10 MG tablet, Take 1 tablet (10 mg total) by mouth daily as needed for allergies., Disp: 30 tablet, Rfl: 1   loteprednol (LOTEMAX) 0.5 % ophthalmic suspension, Place 1 drop into both eyes daily., Disp: 5 mL, Rfl: 1   MAGNESIUM PO, Take by mouth daily as needed (constipation relief)., Disp: , Rfl:    nystatin-triamcinolone (MYCOLOG II) cream, Apply 1 Application topically 2 (two) times daily as needed for up to 14 days., Disp: 28 g, Rfl: 0   predniSONE (DELTASONE) 20 MG tablet, Take 2 tablets (40 mg total) by mouth daily with breakfast for 5 days., Disp: 10 tablet, Rfl: 0   simvastatin (ZOCOR) 40 MG tablet, TAKE 1 TABLET BY MOUTH DAILY AT 6 PM., Disp: 90 tablet, Rfl: 1   triamcinolone cream (KENALOG) 0.1 %, Apply 1 Application topically 2 (two) times  daily for 14 days., Disp: 28 g, Rfl: 0   venlafaxine XR (EFFEXOR-XR) 75 MG 24 hr capsule, TAKE 1 CAPSULE BY MOUTH DAILY WITH BREAKFAST., Disp: 90 capsule, Rfl: 1  EXAM:  VITALS per patient if applicable:Ht 5' 6.5" (1.689 m)   BMI 33.58 kg/m   GENERAL: alert, oriented, appears well and in no acute distress  HEENT: atraumatic, conjunctiva clear, no obvious abnormalities on inspection of external nose and ears  NECK: normal movements of the head and neck  LUNGS: on inspection no signs of respiratory distress, breathing  rate appears normal, no obvious gross SOB, gasping or wheezing  CV: no obvious cyanosis  MS: moves all visible extremities without noticeable abnormality SKIN: Right lateral hip an erythematous raised, rounded lesion. Left foot with scatter papular lesions with crust, no vesicles appreciated. Right foot papular crusty lesion x 1. There is no surrounded erythema.  PSYCH/NEURO: pleasant and cooperative, no obvious depression or anxiety, speech and thought processing grossly intact  ASSESSMENT AND PLAN:  Discussed the following assessment and plan:  Fire ant bite, accidental or unintentional, initial encounter - Plan: predniSONE (DELTASONE) 20 MG tablet, triamcinolone cream 0.1%. In general lesions are healing well but is still pruritic. She agrees with taking prednisone for 3 to 5 days, she has taken it before and has been well-tolerated. Recommend taking medication with breakfast. Avoid scratching. Monitor for signs of bacterial infection. Claritin 10 mg daily for 2 to 3 weeks. Also recommend triamcinolone cream 0.1% twice daily if needed after completing prednisone treatment and for up to 14 days.  Urticaria - Plan: predniSONE (DELTASONE) 20 MG tablet, nystatin-triamcinolone (MYCOLOG II) cream Developed after allergy testing at her immunologist office. Lesion on right hip seems to be a hive. Prednisone and Claritin 10 mg daily will help. Instructed about warning  signs.   We discussed possible serious and likely etiologies, options for evaluation and workup, limitations of telemedicine visit vs in person visit, treatment, treatment risks and precautions. The patient was advised to call back or seek an in-person evaluation if the symptoms worsen or if the condition fails to improve as anticipated. I discussed the assessment and treatment plan with the patient. The patient was provided an opportunity to ask questions and all were answered. The patient agreed with the plan and demonstrated an understanding of the instructions.  I, Rolla Etienne Wierda, acting as a scribe for Joany Khatib [place], MD., have documented all relevant documentation on the behalf of Kaj Vasil [place], MD, as directed by  Lawanna Cecere [place], MD while in the presence of Tajuana Kniskern [place], MD.   I, Briggs Edelen [place], MD, have reviewed all documentation for this visit. The documentation on 04/09/23 for the exam, diagnosis, procedures, and orders are all accurate and complete.  Return if symptoms worsen or fail to improve, for keep next appointment.  Yesli Vanderhoff [place], MD

## 2023-04-10 ENCOUNTER — Other Ambulatory Visit: Payer: Self-pay | Admitting: Family Medicine

## 2023-04-10 DIAGNOSIS — F330 Major depressive disorder, recurrent, mild: Secondary | ICD-10-CM

## 2023-04-24 ENCOUNTER — Other Ambulatory Visit: Payer: Self-pay | Admitting: Allergy and Immunology

## 2023-05-07 ENCOUNTER — Ambulatory Visit (INDEPENDENT_AMBULATORY_CARE_PROVIDER_SITE_OTHER): Payer: Medicare HMO | Admitting: Allergy and Immunology

## 2023-05-07 ENCOUNTER — Encounter: Payer: Self-pay | Admitting: Allergy and Immunology

## 2023-05-07 VITALS — BP 132/86 | HR 65 | Temp 98.0°F | Resp 14 | Ht 66.14 in | Wt 209.4 lb

## 2023-05-07 DIAGNOSIS — J301 Allergic rhinitis due to pollen: Secondary | ICD-10-CM

## 2023-05-07 DIAGNOSIS — H1010 Acute atopic conjunctivitis, unspecified eye: Secondary | ICD-10-CM

## 2023-05-07 DIAGNOSIS — R238 Other skin changes: Secondary | ICD-10-CM | POA: Diagnosis not present

## 2023-05-07 DIAGNOSIS — H1013 Acute atopic conjunctivitis, bilateral: Secondary | ICD-10-CM

## 2023-05-07 DIAGNOSIS — J3089 Other allergic rhinitis: Secondary | ICD-10-CM

## 2023-05-07 MED ORDER — OLOPATADINE HCL 0.2 % OP SOLN: 1.0000 [drp] | OPHTHALMIC | 1 refills | Status: DC

## 2023-05-07 MED ORDER — LOTEPREDNOL ETABONATE 0.5 % OP SUSP: 1.0000 [drp] | Freq: Every day | OPHTHALMIC | 1 refills | Status: DC

## 2023-05-07 MED ORDER — LORATADINE 10 MG PO TABS: 10.0000 mg | ORAL_TABLET | Freq: Every day | ORAL | 1 refills | Status: DC | PRN

## 2023-05-07 MED ORDER — PYRITHIONE ZINC 1 % EX SHAM: MEDICATED_SHAMPOO | Freq: Every day | CUTANEOUS | 1 refills | Status: DC | PRN

## 2023-05-07 MED ORDER — FLONASE SENSIMIST 27.5 MCG/SPRAY NA SUSP: 2.0000 | Freq: Every day | NASAL | 1 refills | Status: DC

## 2023-05-07 NOTE — Progress Notes (Unsigned)
[place] - [place] - [place] - [place] - [place]   Follow-up Note  Referring Provider: [place], Betty G, MD Primary Provider: [place], Betty G, MD Date of Office Visit: 05/07/2023  Subjective:   Jessica Fry (DOB: [birth year]) is a 67 y.o. female who [facility] on 05[facility]07/2023 in re-evaluation of the following:  HPI: Jessica Fry returns to this clinic in evaluation of allergic rhinoconjunctivitis and dry scalp.  I last saw her in this clinic during her initial evaluation of 02 April 2023.  She is very pleased with the response that she has received on her current therapy especially regarding her eye issue.  She no longer needs to rub her eyes and they are not watering and are not gritty and they feel so much better while she consistently uses Pataday and Lotemax at 1 drop each eye 1 time per day.  And she has had very little problems with her nose while using a nasal steroid 1 time per day.  When I last saw her in this clinic she had itchy dry scaly scalp and we asked her to use head and shoulders and that treatment has taking care of that issue.  She has not really performed any allergen avoidance measures directed against dust mite or the animals inside the house.  Allergies as of 05/07/2023       Reactions   Shellfish-derived Products Other (See Comments)   Patient preference.         Medication List    BABY ASPIRIN PO Take 81 mg by mouth every other day.   levothyroxine 100 MCG tablet Commonly known as: SYNTHROID TAKE 1 AND 1/2 TABLETS BY MOUTH MONDAY AND WEDNESDAY AND 1 TABLET BY MOUTH ALL OTHER DAYS   loratadine 10 MG tablet Commonly known as: CLARITIN TAKE 1 TABLET BY MOUTH EVERY DAY AS NEEDED FOR ALLERGY   loteprednol 0.5 % ophthalmic suspension Commonly known as: Lotemax Place 1 drop into both eyes daily.   MAGNESIUM PO Take by mouth daily as needed (constipation relief).   simvastatin 40 MG tablet Commonly known  as: ZOCOR TAKE 1 TABLET BY MOUTH DAILY AT 6 PM.   venlafaxine XR 75 MG 24 hr capsule Commonly known as: EFFEXOR-XR TAKE 1 CAPSULE BY MOUTH DAILY WITH BREAKFAST.   VITAMIN D3 PO Take 1 tablet by mouth daily.    Past Medical History:  Diagnosis Date   Alcohol addiction (HCC)    Allergy    Chicken pox    Depression    Eczema    GERD (gastroesophageal reflux disease)    Hyperlipidemia    Thyroid disease    Hypothyroid   Urine incontinence     Past Surgical History:  Procedure Laterality Date   nose-deviated septum injury  1972    Review of systems negative except as noted in HPI / PMHx or noted below:  Review of Systems  Constitutional: Negative.   HENT: Negative.    Eyes: Negative.   Respiratory: Negative.    Cardiovascular: Negative.   Gastrointestinal: Negative.   Genitourinary: Negative.   Musculoskeletal: Negative.   Skin: Negative.   Neurological: Negative.   Endo/Heme/Allergies: Negative.   Psychiatric/Behavioral: Negative.       Objective:   Vitals:   05/07/23 1608  BP: 132/86  Pulse: 65  Resp: 14  Temp: 98 F (36.7 C)  SpO2: 95%   Height: 5' 6.14" (168 cm)  Weight: 209 lb 6.4 oz (95 kg)   Physical Exam Constitutional:  Appearance: She is not diaphoretic.  HENT:     Head: Normocephalic.     Right Ear: Tympanic membrane, ear canal and external ear normal.     Left Ear: Tympanic membrane, ear canal and external ear normal.     Nose: Nose normal. No mucosal edema or rhinorrhea.     Mouth/Throat:     Pharynx: Uvula midline. No oropharyngeal exudate.  Eyes:     Conjunctiva/sclera: Conjunctivae normal.  Neck:     Thyroid: No thyromegaly.     Trachea: Trachea normal. No tracheal tenderness or tracheal deviation.  Cardiovascular:     Rate and Rhythm: Normal rate and regular rhythm.     Heart sounds: Normal heart sounds, S1 normal and S2 normal. No murmur heard. Pulmonary:     Effort: No respiratory distress.     Breath sounds: Normal  breath sounds. No stridor. No wheezing or rales.  Lymphadenopathy:     Head:     Right side of head: No tonsillar adenopathy.     Left side of head: No tonsillar adenopathy.     Cervical: No cervical adenopathy.  Skin:    Findings: No erythema or rash.     Nails: There is no clubbing.  Neurological:     Mental Status: She is alert.     Diagnostics: none  Assessment and Plan:   1. Perennial allergic rhinitis   2. Seasonal allergic rhinitis due to pollen   3. Perennial allergic conjunctivitis of both eyes   4. Seasonal allergic conjunctivitis   5. Dry scalp    1. Allergen avoidance measures - house dust mite, cat, dog, trees, grasses, weeds.  2. Never, ever rub or touch eye  3. Treat and prevent nasal / eye inflammation:   A. Flonase Sensimist - 1 spray each nostril 3-7 times per week  B. Pataday - 1 drop each eye 3-7 times per week  C. Lotemax - 1 drop each eye 3-7 times per week  4. If needed:   A. Claritin / loratadine 10 mg - 1 tablet 1 time per day  B. head and shoulders shampoo.  5. Return to clinic in 6 months or earlier if problem  Jessica Fry is doing very well at this point in time and there may be an opportunity to consolidate some of her medical treatment and she can decrease her Flonase and Pataday and Lotemax from 1 time per day to Monday Wednesday, and Friday administration and we will see how she does on this lower dose of medicines.  She will still need to go through this upcoming springtime season and hopefully her plan will keep her from developing significant problems as she moves through pollen season.  Certainly there are other approaches to her atopic disease including immunotherapy if she fails medical treatment.  Will see her back in this clinic in 6 months or earlier if there is a problem.  Laurette Schimke, MD Allergy / Immunology [facility]

## 2023-05-07 NOTE — Patient Instructions (Addendum)
  1. Allergen avoidance measures - house dust mite, cat, dog, trees, grasses, weeds.  2. Never, ever rub or touch eye  3. Treat and prevent nasal / eye inflammation:   A. Flonase Sensimist - 1 spray each nostril 3-7 times per week  B. Pataday - 1 drop each eye 3-7 times per week  C. Lotemax - 1 drop each eye 3-7 times per week  4. If needed:   A. Claritin / loratadine 10 mg - 1 tablet 1 time per day  B. head and shoulders shampoo.  5. Return to clinic in 6 months or earlier if problem

## 2023-05-08 ENCOUNTER — Encounter: Payer: Self-pay | Admitting: Allergy and Immunology

## 2023-06-11 ENCOUNTER — Ambulatory Visit: Payer: Medicare HMO | Admitting: Family Medicine

## 2023-07-03 IMAGING — DX DG KNEE COMPLETE 4+V*R*
4 series · 4 of 4 positions shown · non-contrast
Comparison: None.

CLINICAL DATA: Right knee pain.

EXAM:
RIGHT KNEE - COMPLETE 4+ VIEW

[knee ap]
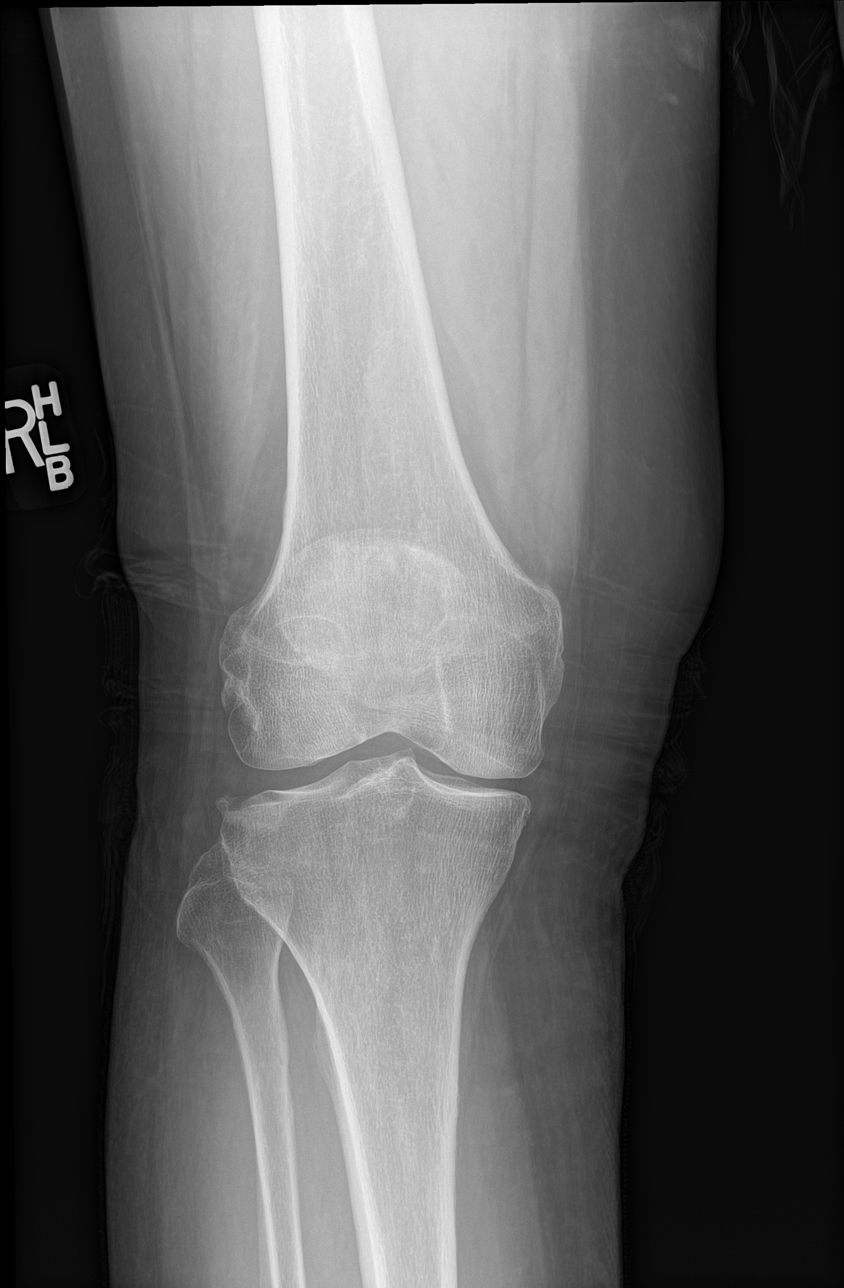

[knee obl (1 of 2)]
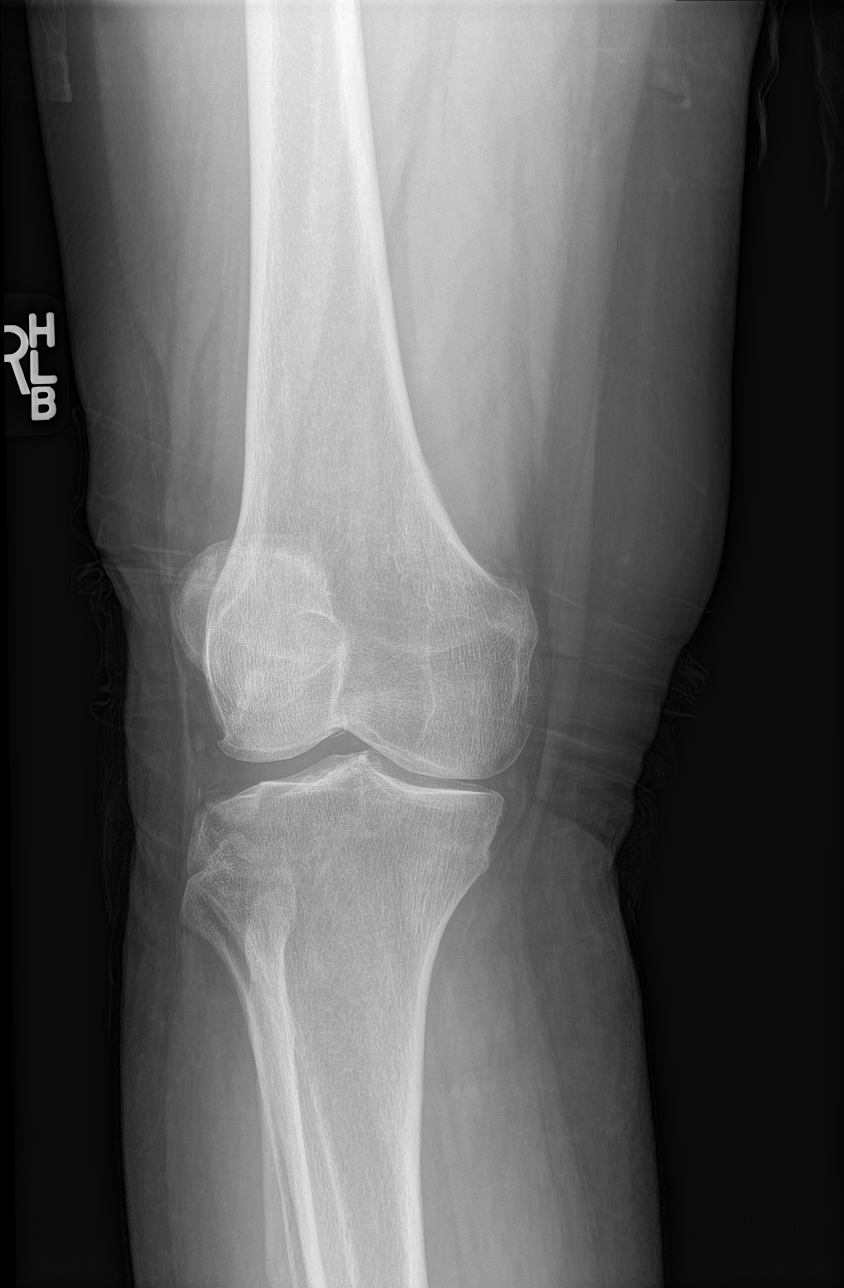

[knee obl (2 of 2)]
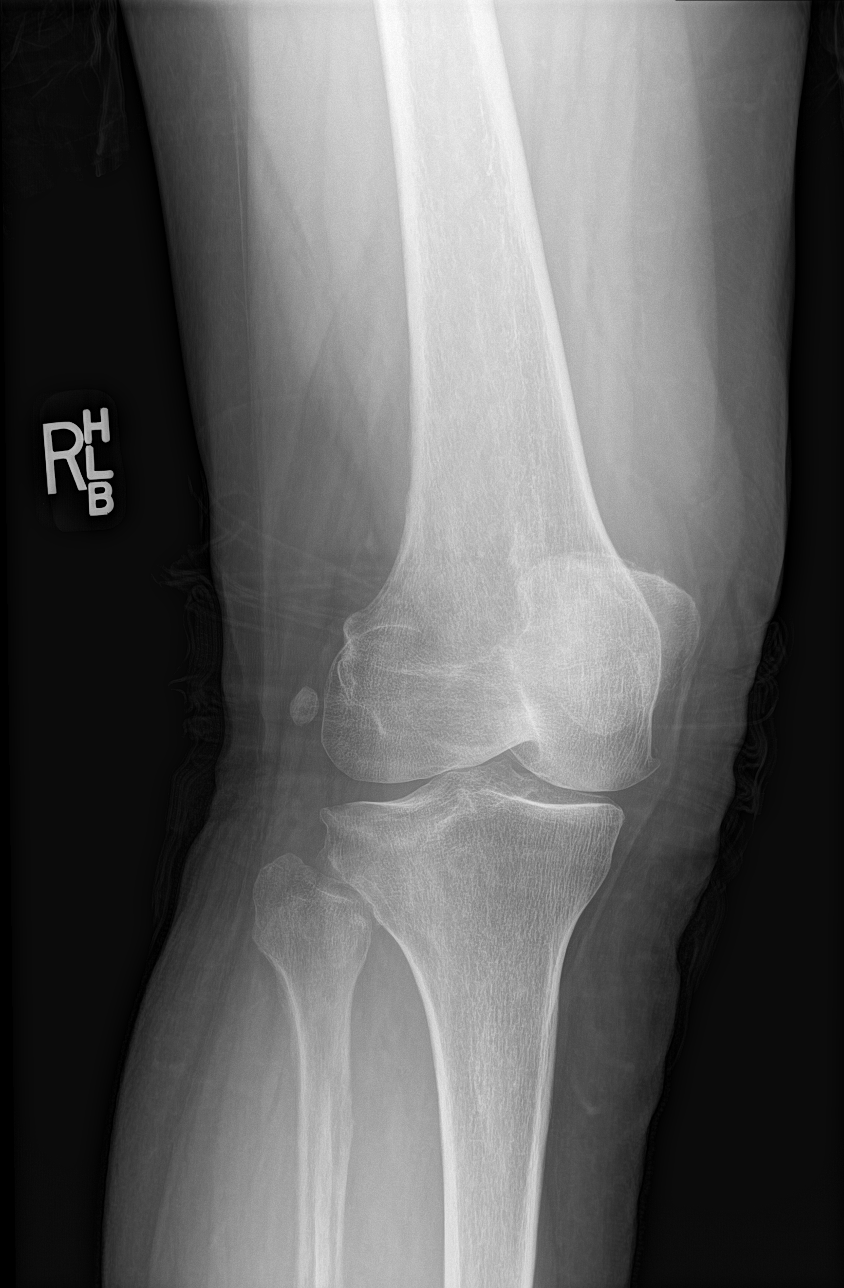

[knee lat]
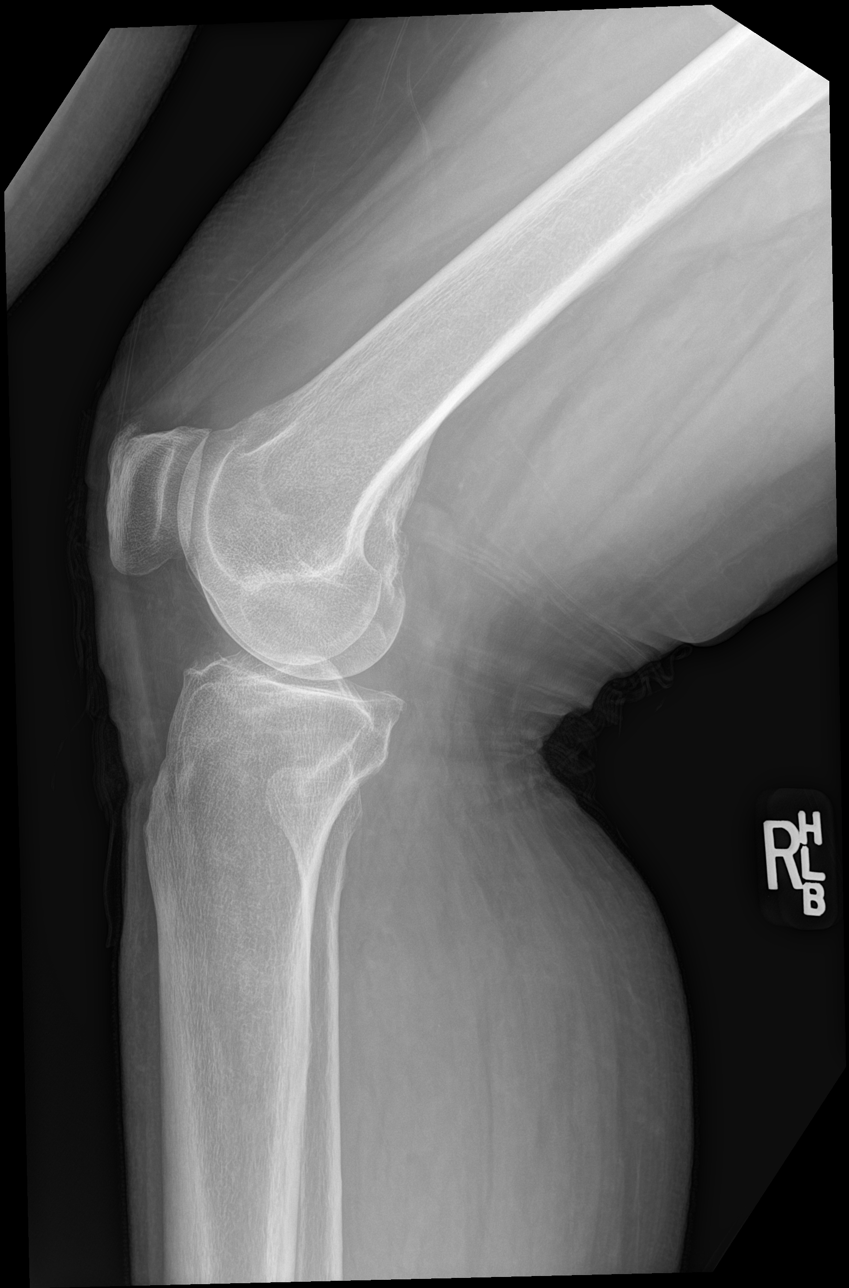

[4 of 4 positions shown; findings below may reference images not displayed]

FINDINGS: Apparent irregularity of the lateral aspect of the lateral tibial
plateau may be chronic but concerning for a depressed fracture. CT
is recommended for better evaluation. No dislocation. There is mild
arthritic changes of the knee with tricompartmental narrowing. There
is a moderate suprapatellar effusion. The soft tissues are
unremarkable.
IMPRESSION: Possible mildly depressed fracture of the lateral tibial plateau. CT
is recommended for better evaluation.

## 2023-07-08 ENCOUNTER — Other Ambulatory Visit: Payer: Self-pay | Admitting: Family Medicine

## 2023-07-08 DIAGNOSIS — F330 Major depressive disorder, recurrent, mild: Secondary | ICD-10-CM

## 2023-07-08 NOTE — Telephone Encounter (Signed)
Copied from CRM #[id number]. Topic: Clinical - Medication Refill >> Jul 08, 2023  3:24 PM Corin V wrote: Most Recent Primary Care Visit:  Provider: [place], BETTY G  Department: [facility]  Visit Type: MYCHART VIDEO VISIT  Date: 04/09/2023  Medication: ***  Has the patient contacted their pharmacy?  (Agent: If no, request that the patient contact the pharmacy for the refill. If patient does not wish to contact the pharmacy document the reason why and proceed with request.) (Agent: If yes, when and what did the pharmacy advise?)  Is this the correct pharmacy for this prescription?  If no, delete pharmacy and type the correct one.  This is the patient's preferred pharmacy:  [id number] - [place], [place] - [address] [place] [address] Phone: [phone] Fax: [phone]  [id number] - [address], [place] - [address] [place] [address] Phone: [phone] Fax: [phone]   Has the prescription been filled recently?   Is the patient out of the medication?   Has the patient been seen for an appointment in the last year OR does the patient have an upcoming appointment?   Can we respond through MyChart?   Agent: Please be advised that Rx refills may take up to 3 business days. We ask that you follow-up with your pharmacy.

## 2023-07-11 ENCOUNTER — Telehealth: Payer: Self-pay

## 2023-07-11 NOTE — Telephone Encounter (Signed)
 Copied from CRM #[id number]. Topic: Clinical - Medication Refill >> Jul 08, 2023  3:24 PM Corin V wrote: Most Recent Primary Care Visit:  Provider: JORDAN, BETTY G  Department: [facility]  Visit Type: MYCHART VIDEO VISIT  Date: 04/09/2023  Medication: venlafaxine  XR (EFFEXOR -XR) 75 MG 24 hr capsule  Has the patient contacted their pharmacy? Yes, no refills remaining (Agent: If no, request that the patient contact the pharmacy for the refill. If patient does not wish to contact the pharmacy document the reason why and proceed with request.) (Agent: If yes, when and what did the pharmacy advise?)  Is this the correct pharmacy for this prescription? Yes If no, delete pharmacy and type the correct one.  This is the patient's preferred pharmacy:  [id number] - [place], [place] - [address] [place] [address] Phone: [phone] Fax: [phone]   Has the prescription been filled recently? No  Is the patient out of the medication? No  Has the patient been seen for an appointment in the last year OR does the patient have an upcoming appointment? Yes  Can we respond through MyChart? No  Agent: Please be advised that Rx refills may take up to 3 business days. We ask that you follow-up with your pharmacy.

## 2023-07-12 ENCOUNTER — Telehealth: Payer: Self-pay | Admitting: *Deleted

## 2023-07-12 DIAGNOSIS — F330 Major depressive disorder, recurrent, mild: Secondary | ICD-10-CM

## 2023-07-12 DIAGNOSIS — E039 Hypothyroidism, unspecified: Secondary | ICD-10-CM

## 2023-07-12 MED ORDER — VENLAFAXINE HCL ER 75 MG PO CP24
75.0000 mg | ORAL_CAPSULE | Freq: Every day | ORAL | 1 refills | Status: DC
Start: 2023-07-12 — End: 2023-07-15

## 2023-07-12 NOTE — Telephone Encounter (Signed)
 Copied from CRM [id number]. Topic: Clinical - Prescription Issue >> Jul 11, 2023  3:44 PM Robinson H wrote: Reason for CRM: Patient calling checking status of medication refill for the venlafaxine  XR (EFFEXOR -XR) 75 MG 24 hr capsule, states pharmacy hasn't received request. Patient has one more day left and states she will start to get depressed.

## 2023-07-15 MED ORDER — VENLAFAXINE HCL ER 75 MG PO CP24: 75.0000 mg | ORAL_CAPSULE | Freq: Every day | ORAL | 1 refills | Status: AC

## 2023-07-15 NOTE — Telephone Encounter (Signed)
 Rx sent

## 2023-07-15 NOTE — Telephone Encounter (Signed)
 Duplicate, see other phone note. Rx has been sent.

## 2023-08-05 MED ORDER — LEVOTHYROXINE SODIUM 100 MCG PO TABS: ORAL_TABLET | ORAL | 1 refills | Status: DC

## 2023-08-05 NOTE — Telephone Encounter (Signed)
Patient would like to get the meds today

## 2023-08-05 NOTE — Telephone Encounter (Signed)
Rx sent

## 2023-08-05 NOTE — Addendum Note (Signed)
Addended by: Weyman Croon E on: 08/05/2023 11:15 AM   Modules accepted: Orders

## 2023-08-05 NOTE — Telephone Encounter (Signed)
levothyroxine (SYNTHROID) 100 MCG tablet patient is needing a prescription sent over to her pharmacy it is showing on the pharmacy side she doesn't have a refil

## 2023-08-21 ENCOUNTER — Encounter: Payer: Self-pay | Admitting: Family Medicine

## 2023-08-21 ENCOUNTER — Telehealth: Payer: Medicare HMO | Admitting: Family Medicine

## 2023-08-21 ENCOUNTER — Ambulatory Visit: Payer: Self-pay | Admitting: Family Medicine

## 2023-08-21 VITALS — Ht 66.14 in

## 2023-08-21 DIAGNOSIS — R062 Wheezing: Secondary | ICD-10-CM

## 2023-08-21 DIAGNOSIS — J069 Acute upper respiratory infection, unspecified: Secondary | ICD-10-CM

## 2023-08-21 NOTE — Progress Notes (Signed)
Virtual Visit via Video Note I connected with Jessica Fry on 08/21/2023 by a video enabled telemedicine application and verified that I am speaking with the correct person using two identifiers. Location patient: home Location provider:work office Persons participating in the virtual visit: patient, provider, scribe  I discussed the limitations of evaluation and management by telemedicine and the availability of in person appointments. The patient expressed understanding and agreed to proceed.  Chief Complaint  Patient presents with   covid symptoms   HPI: [name] is a 68 y.o. female with a PMHx significant for HLD, hypothyroidism, and depression, who is being seen on video today for Covid symptoms.    She complains of rhinorrhea, nasal congestion, sore throat, mild headache, body aches, fatigue, and minimal dry cough. She had some of the symptoms late last night but woke up this morning feeling worse.   Denies any known sick contacts, but she was in the airport yesterday around lots of people.  She has taken some tylenol and used her albuterol this morning because she heard some wheezing.   She hasn't taken a home Covid test yet but went out to get some today.  Pertinent negatives include  fever, chills, SOB,, CP, abdominal pain,nausea, vomiting, diarrhea, urinary symptoms,or skin rash.   ROS: See pertinent positives and negatives per HPI.  Past Medical History:  Diagnosis Date   Alcohol addiction (HCC)    Allergy    Chicken pox    Depression    Eczema    GERD (gastroesophageal reflux disease)    Hyperlipidemia    Thyroid disease    Hypothyroid   Urine incontinence     Past Surgical History:  Procedure Laterality Date   nose-deviated septum injury  1972    Family History  Problem Relation Age of Onset   Early death Mother    Miscarriages / [place] Mother    Alcohol abuse Father    Depression Father    Hearing loss Father    Stroke Father     Early death Brother    Heart attack Maternal Grandmother    Stroke Paternal Grandmother     Social History   Socioeconomic History   Marital status: Divorced    Spouse name: Not on file   Number of children: 2   Years of education: Not on file   Highest education level: Doctorate  Occupational History   Not on file  Tobacco Use   Smoking status: Never    Passive exposure: Never   Smokeless tobacco: Never  Vaping Use   Vaping status: Never Used  Substance and Sexual Activity   Alcohol use: No    Comment: 7 years sober   Drug use: No   Sexual activity: Not Currently    Comment: both previously  Other Topics Concern   Not on file  Social History Narrative   Not on file   Social Drivers of Health   Financial Resource Strain: Low Risk  (08/21/2023)   Overall Financial Resource Strain (CARDIA)    Difficulty of Paying Living Expenses: Not hard at all  Food Insecurity: No Food Insecurity (08/21/2023)   Hunger Vital Sign    Worried About Running Out of Food in the Last Year: Never true    Ran Out of Food in the Last Year: Never true  Transportation Needs: No Transportation Needs (08/21/2023)   PRAPARE - Administrator, Civil Service (Medical): No    Lack of Transportation (Non-Medical): No  Physical Activity: Insufficiently Active (08/21/2023)   Exercise Vital Sign    Days of Exercise per Week: 2 days    Minutes of Exercise per Session: 20 min  Stress: No Stress Concern Present (08/21/2023)   [employer] of Occupational Health - Occupational Stress Questionnaire    Feeling of Stress : Only a little  Social Connections: Moderately Isolated (08/21/2023)   Social Connection and Isolation Panel [NHANES]    Frequency of Communication with Friends and Family: Once a week    Frequency of Social Gatherings with Friends and Family: Once a week    Attends Religious Services: More than 4 times per year    Active Member of [employer] or Organizations: Yes    Attends  Engineer, structural: More than 4 times per year    Marital Status: Divorced  Intimate Partner Violence: Unknown (04/15/2022)   Received from [employer], [facility]   HITS    Physically Hurt: Not on file    Insult or Talk Down To: Not on file    Threaten Physical Harm: Not on file    Scream or Curse: Not on file     Current Outpatient Medications:    BABY ASPIRIN PO, Take 81 mg by mouth every other day., Disp: , Rfl:    Cholecalciferol (VITAMIN D3 PO), Take 1 tablet by mouth daily. , Disp: , Rfl:    fluticasone (FLONASE SENSIMIST) 27.5 MCG/SPRAY nasal spray, Place 2 sprays into the nose daily., Disp: 27.3 mL, Rfl: 1   levothyroxine (SYNTHROID) 100 MCG tablet, TAKE 1 AND 1/2 TABLETS BY MOUTH MONDAY AND WEDNESDAY AND 1 TABLET BY MOUTH ALL OTHER DAYS, Disp: 135 tablet, Rfl: 1   loratadine (CLARITIN) 10 MG tablet, Take 1 tablet (10 mg total) by mouth daily as needed for allergies (Can take an extra dose during flare ups.)., Disp: 180 tablet, Rfl: 1   loteprednol (LOTEMAX) 0.5 % ophthalmic suspension, Place 1 drop into both eyes daily., Disp: 45 mL, Rfl: 1   MAGNESIUM PO, Take by mouth daily as needed (constipation relief)., Disp: , Rfl:    Olopatadine HCl (PATADAY) 0.2 % SOLN, Place 1 drop into both eyes 1 day or 1 dose., Disp: 7.5 mL, Rfl: 1   pyrithione zinc (HEAD AND SHOULDERS) 1 % shampoo, Apply topically daily as needed for itching., Disp: 700 mL, Rfl: 1   simvastatin (ZOCOR) 40 MG tablet, TAKE 1 TABLET BY MOUTH DAILY AT 6 PM., Disp: 90 tablet, Rfl: 1   venlafaxine XR (EFFEXOR-XR) 75 MG 24 hr capsule, Take 1 capsule (75 mg total) by mouth daily with breakfast., Disp: 90 capsule, Rfl: 1  EXAM:  VITALS per patient if applicable:Ht 5' 6.14" (1.68 m)   BMI 33.66 kg/m   GENERAL: alert, oriented, appears well and in no acute distress  HEENT: atraumatic, conjunctiva clear, no obvious abnormalities on inspection of external nose and ears  NECK: normal movements of the head  and neck  LUNGS: on inspection no signs of respiratory distress, breathing rate appears normal, no obvious gross SOB, gasping or wheezing  CV: no obvious cyanosis  MS: moves all visible extremities without noticeable abnormality  PSYCH/NEURO: pleasant and cooperative, no obvious depression or anxiety, speech and thought processing grossly intact  ASSESSMENT AND PLAN:  Discussed the following assessment and plan:  URI, acute Symptoms suggests a viral etiology, sxs just started, so monitor for new symptoms. She has COVID 19 test at home, instructed to check tomorrow and to let me know if positive,  in which case Paxlovid can be sent. Symptomatic treatment is recommended. No clear flu exposure, so prophylaxis is not indicated. Plenty of fluids, rest, Flonase nasal spray and nasal saline irrigations for nasal congestion and rhinorrhea. Throat lozenges can be taken for sore throat. Continue Tylenol 500 mg 3-4 times per day. Cough and nasal congestion can last a few days and sometimes weeks after acute sxs resolved. F/U as needed.  Wheezing Reports having mild episode today in the morning, resolved after using her Albuterol inh. If reoccurs, Albuterol inh 2 puff every 6 hours for a week then as needed for wheezing or shortness of breath.  I do not think imaging is needed. Instructed about warning signs.  We discussed possible serious and likely etiologies, options for evaluation and workup, limitations of telemedicine visit vs in person visit, treatment, treatment risks and precautions. The patient was advised to call back or seek an in-person evaluation if the symptoms worsen or if the condition fails to improve as anticipated. I discussed the assessment and treatment plan with the patient. The patient was provided an opportunity to ask questions and all were answered. The patient agreed with the plan and demonstrated an understanding of the instructions.  Return if symptoms worsen or  fail to improve.  I, Rolla Etienne Wierda, acting as a scribe for Soniyah Mcglory [place], MD., have documented all relevant documentation on the behalf of Mrk Buzby [place], MD, as directed by  Thao Vanover [place], MD while in the presence of Wayne Brunker [place], MD.   I, Yasmene Salomone [place], MD, have reviewed all documentation for this visit. The documentation on 08/21/23 for the exam, diagnosis, procedures, and orders are all accurate and complete.  Marl Seago [place], MD

## 2023-08-21 NOTE — Telephone Encounter (Signed)
Chief Complaint: COVID symptoms Symptoms: headache, runny nose, sore throat Frequency: started last night Pertinent Negatives: Patient denies fever Disposition: [] ED /[] Urgent Care (no appt availability in office) / [x] Appointment(In office/virtual)/ []  [facility]/ [] Home Care/ [] Refused Recommended Disposition /[] [facility]/ []  Follow-up with PCP Additional Notes: patient called with concerns for COVID symptoms. Patient just got back last night from traveling. Patient was in [place]. Patient states she was in different airports and thinks she may have come down with COVID. Patient is unable to complete a home test as her home test is out of date. Patient endorses headache, runny nose and sore throat. Per protocol, an appointment is the recommendation. Video visit appoint with PCP scheduled for 08/21/2023 at 3:30 pm. Patient verbalized understanding of the plan and all questions answered.     Copied From CRM [id number]. Reason for Triage: Thinks she might have COVID, headache, runny nose, sore throat. Was recently traveling.   Jessica Fry [phone]    Reason for Disposition  [1] HIGH RISK patient (e.g., weak immune system, age > 64 years, obesity with BMI 30 or higher, pregnant, chronic lung disease or other chronic medical condition) AND [2] COVID symptoms (e.g., cough, fever)  (Exceptions: Already seen by PCP and no new or worsening symptoms.)  Answer Assessment - Initial Assessment Questions 1. COVID-19 DIAGNOSIS: "How do you know that you have COVID?" (e.g., positive lab test or self-test, diagnosed by doctor or NP/PA, symptoms after exposure).     Patient is suspecting that she is positive after traveling through airports. Returned from traveling last night 2. COVID-19 EXPOSURE: "Was there any known exposure to COVID before the symptoms began?" CDC Definition of close contact: within 6 feet (2 meters) for a total of 15 minutes or more over a 24-hour period.       Traveling and spent time in airports 3. ONSET: "When did the COVID-19 symptoms start?"      Symptoms started last night 4. WORST SYMPTOM: "What is your worst symptom?" (e.g., cough, fever, shortness of breath, muscle aches)     Headache, fatigue 5. COUGH: "Do you have a cough?" If Yes, ask: "How bad is the cough?"       Dry cough 6. FEVER: "Do you have a fever?" If Yes, ask: "What is your temperature, how was it measured, and when did it start?"     Patient doesn't think so 7. RESPIRATORY STATUS: "Describe your breathing?" (e.g., normal; shortness of breath, wheezing, unable to speak)      Breathing is normal 8. BETTER-SAME-WORSE: "Are you getting better, staying the same or getting worse compared to yesterday?"  If getting worse, ask, "In what way?"     Staying the same 9. OTHER SYMPTOMS: "Do you have any other symptoms?"  (e.g., chills, fatigue, headache, loss of smell or taste, muscle pain, sore throat)     Sore throat, runny nose, headache 10. HIGH RISK DISEASE: "Do you have any chronic medical problems?" (e.g., asthma, heart or lung disease, weak immune system, obesity, etc.)       Asthma, hypothyroidism, 11. VACCINE: "Have you had the COVID-19 vaccine?" If Yes, ask: "Which one, how many shots, when did you get it?"       Yes-four 13. O2 SATURATION MONITOR:  "Do you use an oxygen saturation monitor (pulse oximeter) at home?" If Yes, ask "What is your reading (oxygen level) today?" "What is your usual oxygen saturation reading?" (e.g., 95%)       No  Protocols used: Coronavirus (COVID-19) Diagnosed or Suspected-A-AH

## 2023-08-22 ENCOUNTER — Telehealth: Payer: Self-pay | Admitting: *Deleted

## 2023-08-22 NOTE — Telephone Encounter (Signed)
Copied from CRM [id number]. Topic: Clinical - Medication Question >> Aug 22, 2023  3:56 PM Jessica Fry wrote: Reason for CRM:  Pt called to speak with nurse regarding her flu symtoms. Pt is requesting a call back  [phone]

## 2023-08-23 ENCOUNTER — Telehealth: Payer: Medicare HMO | Admitting: Nurse Practitioner

## 2023-08-23 ENCOUNTER — Telehealth: Payer: Medicare HMO

## 2023-08-23 ENCOUNTER — Encounter: Payer: Self-pay | Admitting: Nurse Practitioner

## 2023-08-23 DIAGNOSIS — J069 Acute upper respiratory infection, unspecified: Secondary | ICD-10-CM

## 2023-08-23 NOTE — Patient Instructions (Signed)
It was great to see you!  Start mucinex every 6 hours that you have at your house  You can take ibuprofen as needed for fever since mucinex has tylenol in it  Drink plenty of fluids and get rest  Let's follow-up if your symptoms worsen or don't improve.   Take care,  Rodman Pickle, NP

## 2023-08-23 NOTE — Progress Notes (Signed)
[facility] [address] [place] [address] Dept: [phone] Dept Fax: [phone]  Virtual Video Visit  I connected with Jessica Fry on 08/23/23 at  9:40 AM EST by a video enabled telemedicine application and verified that I am speaking with the correct person using two identifiers.  Location patient: Home Location provider: Clinic Fry participating in the virtual visit: Patient; Jessica Pickle, NP; Jessica Persons, RN  I discussed the limitations of evaluation and management by telemedicine and the availability of in person appointments. The patient expressed understanding and agreed to proceed.  Chief Complaint  Patient presents with   Nasal Congestion    Yellow and green mucous, chest congestion, trouble breathing, fever, chills started two and a half days. Taking OTC Tylenol. Called EMS Thurs morning at 0030 because she was choking. VS were stable. They advised her to sit/lay upright. Has used albuterol inhaler twice.    SUBJECTIVE:  HPI: Jessica Fry is a 68 y.o. female who presents with productive cough, chest congestion for 3 days. She had to call EMS Thursday morning because there was mucus in her throat and she felt like it was blocked. She was told to sit up while sleeping. This has not happened since. She did a covid-19 test and it was negative.   UPPER RESPIRATORY TRACT INFECTION  Fever: yes Cough: yes - greenish Shortness of breath: no Wheezing: no Chest pain: no Chest tightness: yes Chest congestion: yes Nasal congestion: no Runny nose: yes - not all the time Post nasal drip: no Sneezing: no Sore throat: yes - at the beginning Swollen glands: no Sinus pressure: no Headache: yes - at times Face pain: no Toothache: no Ear pain: no bilateral Ear pressure: no bilateral Eyes red/itching:no Eye drainage/crusting: no  Vomiting: no Rash: no Fatigue: yes Sick contacts:   recent travel in the airports in [place] and [place] Strep contacts: no  Context: stable Recurrent sinusitis: no Relief with OTC cold/cough medications: no  Treatments attempted: tylenol, tea   Patient Active Problem List   Diagnosis Date Noted   Class 1 obesity with body mass index (BMI) of 33.0 to 33.9 in adult 04/06/2022   Allergic rhinitis 10/26/2019   Alcohol dependence, in remission (HCC) 02/03/2019   Hyperlipidemia 08/26/2017   Major depression, recurrent (HCC) 08/26/2017   Primary hypothyroidism 08/26/2017    Past Surgical History:  Procedure Laterality Date   nose-deviated septum injury  1972    Family History  Problem Relation Age of Onset   Early death Mother    Miscarriages / [place] Mother    Alcohol abuse Father    Depression Father    Hearing loss Father    Stroke Father    Early death Brother    Heart attack Maternal Grandmother    Stroke Paternal Grandmother     Social History   Tobacco Use   Smoking status: Never    Passive exposure: Never   Smokeless tobacco: Never  Vaping Use   Vaping status: Never Used  Substance Use Topics   Alcohol use: No    Comment: 7 years sober   Drug use: No     Current Outpatient Medications:    BABY ASPIRIN PO, Take 81 mg by mouth every other day., Disp: , Rfl:    Cholecalciferol (VITAMIN D3 PO), Take 1 tablet by mouth daily. , Disp: , Rfl:    fluticasone (FLONASE SENSIMIST) 27.5 MCG/SPRAY nasal spray, Place 2 sprays into the nose daily.,  Disp: 27.3 mL, Rfl: 1   levothyroxine (SYNTHROID) 100 MCG tablet, TAKE 1 AND 1/2 TABLETS BY MOUTH MONDAY AND WEDNESDAY AND 1 TABLET BY MOUTH ALL OTHER DAYS, Disp: 135 tablet, Rfl: 1   loratadine (CLARITIN) 10 MG tablet, Take 1 tablet (10 mg total) by mouth daily as needed for allergies (Can take an extra dose during flare ups.)., Disp: 180 tablet, Rfl: 1   loteprednol (LOTEMAX) 0.5 % ophthalmic suspension, Place 1 drop into both eyes daily., Disp: 45 mL, Rfl: 1   Olopatadine  HCl (PATADAY) 0.2 % SOLN, Place 1 drop into both eyes 1 day or 1 dose., Disp: 7.5 mL, Rfl: 1   simvastatin (ZOCOR) 40 MG tablet, TAKE 1 TABLET BY MOUTH DAILY AT 6 PM., Disp: 90 tablet, Rfl: 1   venlafaxine XR (EFFEXOR-XR) 75 MG 24 hr capsule, Take 1 capsule (75 mg total) by mouth daily with breakfast., Disp: 90 capsule, Rfl: 1   MAGNESIUM PO, Take by mouth daily as needed (constipation relief). (Patient not taking: Reported on 08/23/2023), Disp: , Rfl:    pyrithione zinc (HEAD AND SHOULDERS) 1 % shampoo, Apply topically daily as needed for itching., Disp: 700 mL, Rfl: 1  Allergies  Allergen Reactions   Shellfish-Derived Products Other (See Comments)    Patient preference.     ROS: See pertinent positives and negatives per HPI.  OBSERVATIONS/OBJECTIVE:  VITALS per patient if applicable: There were no vitals filed for this visit. There is no height or weight on file to calculate BMI.    GENERAL: Alert and oriented. Appears well and in no acute distress.  HEENT: Atraumatic. Conjunctiva clear. No obvious abnormalities on inspection of external nose and ears.  NECK: Normal movements of the head and neck.  LUNGS: On inspection, no signs of respiratory distress. Breathing rate appears normal. No obvious gross SOB, gasping or wheezing, and no conversational dyspnea.  CV: No obvious cyanosis.  MS: Moves all visible extremities without noticeable abnormality.  PSYCH/NEURO: Pleasant and cooperative. No obvious depression or anxiety. Speech and thought processing grossly intact.  ASSESSMENT AND PLAN:  Problem List Items Addressed This Visit   None Visit Diagnoses       Upper respiratory tract infection, unspecified type    -  Primary   Most likely viral, covid test negative. Encourage fluids, rest. Can take mucinex prn to help break up phelgm. F/U if not improving.        I discussed the assessment and treatment plan with the patient. The patient was provided an opportunity to ask  questions and all were answered. The patient agreed with the plan and demonstrated an understanding of the instructions.   The patient was advised to call back or seek an in-person evaluation if the symptoms worsen or if the condition fails to improve as anticipated.   Gerre Scull, NP

## 2023-08-23 NOTE — Telephone Encounter (Signed)
I called and spoke with patient. She is concerned about bacterial bronchitis and has an appointment at 10am with telehealth. She will see what they recommend. Did advise that we could bring her into the office today to have someone take a look and listen to her, but she is not feeling strong enough to drive.

## 2023-09-10 ENCOUNTER — Ambulatory Visit: Admitting: Family Medicine

## 2023-09-10 ENCOUNTER — Encounter: Payer: Self-pay | Admitting: Family Medicine

## 2023-09-10 VITALS — BP 126/80 | HR 67 | Resp 16 | Ht 66.14 in | Wt 210.0 lb

## 2023-09-10 DIAGNOSIS — E039 Hypothyroidism, unspecified: Secondary | ICD-10-CM

## 2023-09-10 DIAGNOSIS — R7303 Prediabetes: Secondary | ICD-10-CM | POA: Diagnosis not present

## 2023-09-10 DIAGNOSIS — Z1211 Encounter for screening for malignant neoplasm of colon: Secondary | ICD-10-CM

## 2023-09-10 DIAGNOSIS — K5900 Constipation, unspecified: Secondary | ICD-10-CM | POA: Diagnosis not present

## 2023-09-10 LAB — HEMOGLOBIN A1C: Hgb A1c MFr Bld: 6 % (ref 4.6–6.5)

## 2023-09-10 LAB — COMPREHENSIVE METABOLIC PANEL
ALT: 17 U/L (ref 0–35)
AST: 18 U/L (ref 0–37)
Albumin: 4.1 g/dL (ref 3.5–5.2)
Alkaline Phosphatase: 75 U/L (ref 39–117)
BUN: 14 mg/dL (ref 6–23)
CO2: 26 meq/L (ref 19–32)
Calcium: 9.6 mg/dL (ref 8.4–10.5)
Chloride: 105 meq/L (ref 96–112)
Creatinine, Ser: 0.77 mg/dL (ref 0.40–1.20)
GFR: 78.84 mL/min (ref >60.00)
Glucose, Bld: 93 mg/dL (ref 70–99)
Potassium: 4 meq/L (ref 3.5–5.1)
Sodium: 138 meq/L (ref 135–145)
Total Bilirubin: 0.4 mg/dL (ref 0.2–1.2)
Total Protein: 7.1 g/dL (ref 6.0–8.3)

## 2023-09-10 LAB — CBC
HCT: 41.6 % (ref 36.0–46.0)
Hemoglobin: 13.9 g/dL (ref 12.0–15.0)
MCHC: 33.5 g/dL (ref 30.0–36.0)
MCV: 86.8 fl (ref 78.0–100.0)
Platelets: 365 10*3/uL (ref 150.0–400.0)
RBC: 4.79 Mil/uL (ref 3.87–5.11)
RDW: 14 % (ref 11.5–15.5)
WBC: 7.1 10*3/uL (ref 4.0–10.5)

## 2023-09-10 LAB — T4, FREE: Free T4: 0.97 ng/dL (ref 0.60–1.60)

## 2023-09-10 LAB — TSH: TSH: 0.68 u[IU]/mL (ref 0.35–5.50)

## 2023-09-10 NOTE — Patient Instructions (Addendum)
 A few things to remember from today's visit:  Constipation, unspecified constipation type - Plan: Comprehensive metabolic panel, CBC, Ambulatory referral to Gastroenterology  Primary hypothyroidism - Plan: Comprehensive metabolic panel, T4, free, TSH  Prediabetes - Plan: Comprehensive metabolic panel, Hemoglobin A1c  Colon cancer screening - Plan: Ambulatory referral to Gastroenterology  Try Miralax daily at night or evey other day and Bisacodyl 5 mg every other day at bedtime. Adequate fiber and fluid intake.  If you need refills for medications you take chronically, please call your pharmacy. Do not use My Chart to request refills or for acute issues that need immediate attention. If you send a my chart message, it may take a few days to be addressed, specially if I am not in the office.  Please be sure medication list is accurate. If a new problem present, please set up appointment sooner than planned today.

## 2023-09-10 NOTE — Progress Notes (Signed)
 ACUTE VISIT Chief Complaint  Patient presents with   Constipation   Bloated   Stool Color Change   Gas   Hemorrhoids   HPI: [name] is a 68 y.o. female with a PMHx significant for HLD, hypothyroidism, and depression, who is here today with above complaints.  She complains of new constipation,increased gas, and dark brown stools for a few months. Also endorses some dizziness.  Additionally, she believes she has hemorrhoids, and says they have been worse lately.  She has to strain a lot with defecation, and says the shape of her stools has changed, she thinks it is because hemorrhoids. Also endorses occasional stool incontinence.  She has been taking magnesium pills, which have helped some.  With the magnesium, she has bowel movements a couple of times per week.  Also trying OTC [employer] supplements which she believes are helping with her gas.   Pertinent negatives include fever,abnormal wt loss, blood in her stool, melena, nausea, vomiting, abdominal pain, or dietary changes.   She also reports that her son mentions she snores loud. No known sleep apnea. She is planning on using OTC chin strap for snoring.  No usual fatigue or headaches.  Hypothyroidism:  Currently on levothyroxine 150 mcg on Mondays and Wednesdays and 100 mcg on other days.  Last TSH 1.3 in 03/2022.  Prediabetes: Last HgA1C 5.8 in 03/2022. Negative for  polydipsia,polyuria, or polyphagia.  Review of Systems  Constitutional:  Negative for activity change, appetite change and fever.  HENT:  Negative for mouth sores, sore throat and trouble swallowing.   Eyes:  Negative for redness and visual disturbance.  Respiratory:  Negative for cough and shortness of breath.   Cardiovascular:  Negative for chest pain, palpitations and leg swelling.  Endocrine: Negative for cold intolerance and heat intolerance.  Genitourinary:  Negative for decreased urine volume, dysuria and hematuria.  Skin:   Negative for rash.  Neurological:  Negative for syncope, facial asymmetry and weakness.  Psychiatric/Behavioral:  Negative for confusion.   See other pertinent positives and negatives in HPI.  Current Outpatient Medications on File Prior to Visit  Medication Sig Dispense Refill   BABY ASPIRIN PO Take 81 mg by mouth every other day.     Cholecalciferol (VITAMIN D3 PO) Take 1 tablet by mouth daily.      fluticasone (FLONASE SENSIMIST) 27.5 MCG/SPRAY nasal spray Place 2 sprays into the nose daily. 27.3 mL 1   levothyroxine (SYNTHROID) 100 MCG tablet TAKE 1 AND 1/2 TABLETS BY MOUTH MONDAY AND WEDNESDAY AND 1 TABLET BY MOUTH ALL OTHER DAYS 135 tablet 1   loratadine (CLARITIN) 10 MG tablet Take 1 tablet (10 mg total) by mouth daily as needed for allergies (Can take an extra dose during flare ups.). 180 tablet 1   loteprednol (LOTEMAX) 0.5 % ophthalmic suspension Place 1 drop into both eyes daily. 45 mL 1   MAGNESIUM PO Take by mouth daily as needed (constipation relief).     Olopatadine HCl (PATADAY) 0.2 % SOLN Place 1 drop into both eyes 1 day or 1 dose. 7.5 mL 1   simvastatin (ZOCOR) 40 MG tablet TAKE 1 TABLET BY MOUTH DAILY AT 6 PM. 90 tablet 1   venlafaxine XR (EFFEXOR-XR) 75 MG 24 hr capsule Take 1 capsule (75 mg total) by mouth daily with breakfast. 90 capsule 1   No current facility-administered medications on file prior to visit.   Past Medical History:  Diagnosis Date   Alcohol addiction (HCC)  Allergy    Chicken pox    Depression    Eczema    GERD (gastroesophageal reflux disease)    Hyperlipidemia    Thyroid disease    Hypothyroid   Urine incontinence    Allergies  Allergen Reactions   Shellfish-Derived Products Other (See Comments)    Patient preference.    Social History   Socioeconomic History   Marital status: Divorced    Spouse name: Not on file   Number of children: 2   Years of education: Not on file   Highest education level: Doctorate  Occupational  History   Not on file  Tobacco Use   Smoking status: Never    Passive exposure: Never   Smokeless tobacco: Never  Vaping Use   Vaping status: Never Used  Substance and Sexual Activity   Alcohol use: No    Comment: 7 years sober   Drug use: No   Sexual activity: Not Currently    Comment: both previously  Other Topics Concern   Not on file  Social History Narrative   Not on file   Social Drivers of Health   Financial Resource Strain: Low Risk  (08/21/2023)   Overall Financial Resource Strain (CARDIA)    Difficulty of Paying Living Expenses: Not hard at all  Food Insecurity: No Food Insecurity (08/21/2023)   Hunger Vital Sign    Worried About Running Out of Food in the Last Year: Never true    Ran Out of Food in the Last Year: Never true  Transportation Needs: No Transportation Needs (08/21/2023)   PRAPARE - Administrator, Civil Service (Medical): No    Lack of Transportation (Non-Medical): No  Physical Activity: Insufficiently Active (08/21/2023)   Exercise Vital Sign    Days of Exercise per Week: 2 days    Minutes of Exercise per Session: 20 min  Stress: No Stress Concern Present (08/21/2023)   [employer] of Occupational Health - Occupational Stress Questionnaire    Feeling of Stress : Only a little  Social Connections: Moderately Isolated (08/21/2023)   Social Connection and Isolation Panel [NHANES]    Frequency of Communication with Friends and Family: Once a week    Frequency of Social Gatherings with Friends and Family: Once a week    Attends Religious Services: More than 4 times per year    Active Member of [employer] or Organizations: Yes    Attends Banker Meetings: More than 4 times per year    Marital Status: Divorced    Vitals:   09/10/23 1118  BP: 126/80  Pulse: 67  Resp: 16  SpO2: 97%   Body mass index is 33.75 kg/m.  Physical Exam Vitals and nursing note reviewed.  Constitutional:      General: She is not in acute  distress.    Appearance: She is well-developed.  HENT:     Head: Normocephalic and atraumatic.     Mouth/Throat:     Mouth: Mucous membranes are moist.     Pharynx: Oropharynx is clear.  Eyes:     Conjunctiva/sclera: Conjunctivae normal.  Neck:     Thyroid: No thyroid mass or thyromegaly.  Cardiovascular:     Rate and Rhythm: Normal rate and regular rhythm.     Pulses:          Dorsalis pedis pulses are 2+ on the right side and 2+ on the left side.     Heart sounds: No murmur heard. Pulmonary:  Effort: Pulmonary effort is normal. No respiratory distress.     Breath sounds: Normal breath sounds.  Abdominal:     Palpations: Abdomen is soft. There is no mass.     Tenderness: There is no abdominal tenderness.  Genitourinary:    Comments: Rectal exam deferred to GI. Musculoskeletal:     Right lower leg: No edema.     Left lower leg: No edema.  Lymphadenopathy:     Cervical: No cervical adenopathy.  Skin:    General: Skin is warm.     Findings: No erythema or rash.  Neurological:     General: No focal deficit present.     Mental Status: She is alert and oriented to person, place, and time.     Cranial Nerves: No cranial nerve deficit.     Gait: Gait normal.  Psychiatric:        Mood and Affect: Affect normal. Mood is anxious.    ASSESSMENT AND PLAN:  Ms. Klare was seen today for constipation.   Lab Results  Component Value Date   TSH 0.68 09/10/2023   Lab Results  Component Value Date   NA 138 09/10/2023   CL 105 09/10/2023   K 4.0 09/10/2023   CO2 26 09/10/2023   BUN 14 09/10/2023   CREATININE 0.77 09/10/2023   GFR 78.84 09/10/2023   CALCIUM 9.6 09/10/2023   ALBUMIN 4.1 09/10/2023   GLUCOSE 93 09/10/2023   Lab Results  Component Value Date   ALT 17 09/10/2023   AST 18 09/10/2023   ALKPHOS 75 09/10/2023   BILITOT 0.4 09/10/2023   Lab Results  Component Value Date   WBC 7.1 09/10/2023   HGB 13.9 09/10/2023   HCT 41.6 09/10/2023   MCV 86.8  09/10/2023   PLT 365.0 09/10/2023    Constipation, unspecified constipation type New onset and reporting changes in stool shape. She is overdue for colonoscopy. Recommend OTC Miralax daily and Bisacodyl 5 mg every other day as needed. Adequate fiber and fluid intake. We can consider Linzess if problem does not improved. GI referral placed.  -     Comprehensive metabolic panel; Future -     CBC; Future -     Ambulatory referral to Gastroenterology  Primary hypothyroidism Assessment & Plan: Problem has been adequately controlled. Continue levothyroxine 100 mcg 1-1/2 tablet Monday and Wednesday and 1 tablet the rest of the days. Further recommendations according to TSH result.  Orders: -     Comprehensive metabolic panel; Future -     T4, free; Future -     TSH; Future  Prediabetes Assessment & Plan: Encouraged consistency with a healthy lifestyle for diabetes prevention. Further recommendations according to HgA1C result.  Orders: -     Comprehensive metabolic panel; Future -     Hemoglobin A1c; Future  Colon cancer screening -     Ambulatory referral to Gastroenterology   Return in about 4 months (around 01/10/2024) for chronic problems.  I, Rolla Etienne Wierda, acting as a scribe for Jamerion Cabello [place], MD., have documented all relevant documentation on the behalf of Uliana Brinker [place], MD, as directed by  Abeer Iversen [place], MD while in the presence of Maguire Killmer [place], MD.   I, Takiesha Mcdevitt [place], MD, have reviewed all documentation for this visit. The documentation on 09/10/23 for the exam, diagnosis, procedures, and orders are all accurate and complete.  Zonya Gudger G. [place], MD  [facility]. [place] office.

## 2023-09-12 ENCOUNTER — Telehealth: Payer: Self-pay | Admitting: *Deleted

## 2023-09-12 ENCOUNTER — Encounter: Payer: Self-pay | Admitting: Family Medicine

## 2023-09-12 NOTE — Assessment & Plan Note (Signed)
 Encouraged consistency with a healthy life style for diabetes prevention. Further recommendations according to HgA1C result.

## 2023-09-12 NOTE — Assessment & Plan Note (Signed)
 Problem has been adequately controlled. Continue levothyroxine 100 mcg 1-1/2 tablet Monday and Wednesday and 1 tablet the rest of the days. Further recommendations according to TSH result.

## 2023-09-12 NOTE — Telephone Encounter (Signed)
 Copied from CRM [id number]. Topic: Referral - Status >> Sep 12, 2023 12:35 PM Sonny Dandy B wrote: Reason for CRM: pt called to follow up on gastro referral for a colonoscopy. Please give pt a call back at [phone]

## 2023-10-01 ENCOUNTER — Other Ambulatory Visit: Payer: Self-pay | Admitting: Family Medicine

## 2023-10-01 DIAGNOSIS — E785 Hyperlipidemia, unspecified: Secondary | ICD-10-CM

## 2023-10-24 ENCOUNTER — Encounter: Payer: Self-pay | Admitting: Family Medicine

## 2023-10-24 ENCOUNTER — Ambulatory Visit (INDEPENDENT_AMBULATORY_CARE_PROVIDER_SITE_OTHER): Admitting: Family Medicine

## 2023-10-24 DIAGNOSIS — Z0000 Encounter for general adult medical examination without abnormal findings: Secondary | ICD-10-CM | POA: Diagnosis not present

## 2023-10-24 NOTE — Patient Instructions (Signed)
 I really enjoyed getting to talk with you today! I am available on Tuesdays and Thursdays for virtual visits if you have any questions or concerns, or if I can be of any further assistance.   CHECKLIST FROM ANNUAL WELLNESS VISIT:  -Follow up (please call to schedule if not scheduled after visit):   -yearly for annual wellness visit with primary care office  Here is a list of your preventive care/health maintenance measures and the plan for each if any are due:  PLAN For any measures below that may be due:  -please talk to the gastroenterologist about the colon cancer screening -please consider getting the mammogram and bone density tests and let us know if you change your mind -can get any vaccine due at the pharmacy, please let us know if you do so that we can update your records Health Maintenance  Topic Date Due   Fecal DNA (Cologuard)  Never done   DTaP/Tdap/Td (1 - Tdap) 11/30/2022   COVID-19 Vaccine (5 - 2024-25 season) 09/06/2023   Zoster Vaccines- Shingrix (1 of 2) 07/02/2004   MAMMOGRAM  01/15/2020   DEXA SCAN  07/03/2019   INFLUENZA VACCINE  02/07/2024   Medicare Annual Wellness (AWV)  10/23/2024   Pneumonia Vaccine 20+ Years old  Completed   Hepatitis C Screening  Completed   HPV VACCINES  Aged Out   Meningococcal B Vaccine  Aged Out    -See a dentist at least yearly  -Get your eyes checked and then per your eye specialist's recommendations  -Other issues addressed today:   -I have included below further information regarding a healthy whole foods based diet, physical activity guidelines for adults, stress management and opportunities for social connections. I hope you find this information useful.    -----------------------------------------------------------------------------------------------------------------------------------------------------------------------------------------------------------------------------------------------------------    NUTRITION: -eat real food: lots of colorful vegetables (half the plate) and fruits -5-7 servings of vegetables and fruits per day (fresh or steamed is best), exp. 2 servings of vegetables with lunch and dinner and 2 servings of fruit per day. Berries and greens such as kale and collards are great choices.  -consume on a regular basis:  fresh fruits, fresh veggies, fish, nuts, seeds, healthy oils (such as olive oil, avocado oil), whole grains (make sure for bread/pasta/crackers/etc., that the first ingredient on label contains the word "whole"), legumes. -can eat small amounts of dairy and lean meat (no larger than the palm of your hand), but avoid processed meats such as ham, bacon, lunch meat, etc. -drink water -try to avoid fast food and pre-packaged foods, processed meat, ultra processed foods/beverages (donuts, candy, etc.) -most experts advise limiting sodium to < 2300mg  per day, should limit further is any chronic conditions such as high blood pressure, heart disease, diabetes, etc. The American Heart Association advised that < 1500mg  is is ideal -try to avoid foods/beverages that contain any ingredients with names you do not recognize  -try to avoid foods/beverages  with added sugar or sweeteners/sweets  -try to avoid sweet drinks (including diet drinks): soda, juice, Gatorade, sweet tea, power drinks, diet drinks -try to avoid white rice, white bread, pasta (unless whole grain)  EXERCISE GUIDELINES FOR ADULTS: -if you wish to increase your physical activity, do so gradually and with the approval of your doctor -STOP and seek medical care immediately if you have any chest pain, chest discomfort or trouble breathing when starting or  increasing exercise  -move and stretch your body, legs, feet and arms when sitting for long periods -Physical activity  guidelines for optimal health in adults: -get at least 150 minutes per week of moderate exercise (can talk, but not sing); this is about 20-30 minutes of sustained activity 5-7 days per week or two 10-15 minute episodes of sustained activity 5-7 days per week -do some muscle building/resistance training/strength training at least 2 days per week  -balance exercises 3+ days per week:   Stand somewhere where you have something sturdy to hold onto if you lose balance    1) lift up on toes, then back down, start with 5x per day and work up to 20x   2) stand and lift one leg straight out to the side so that foot is a few inches of the floor, start with 5x each side and work up to 20x each side   3) stand on one foot, start with 5 seconds each side and work up to 20 seconds on each side  If you need ideas or help with getting more active:  -Silver sneakers https://tools.silversneakers.com  -Walk with a Doc: http://www.duncan-williams.com/  -try to include resistance (weight lifting/strength building) and balance exercises twice per week: or the following link for ideas: http://castillo-powell.com/  BuyDucts.dk  STRESS MANAGEMENT: -can try meditating, or just sitting quietly with deep breathing while intentionally relaxing all parts of your body for 5 minutes daily -if you need further help with stress, anxiety or depression please follow up with your primary doctor or contact the wonderful folks at [facility]: [phone]  SOCIAL CONNECTIONS: -options in [place] if you wish to engage in more social and exercise related activities:  -Silver sneakers https://tools.silversneakers.com  -Walk with a Doc: http://www.duncan-williams.com/  -Check out the [employer] Active Adults 50+  section on the [place] [employer] (hiking clubs, book clubs, cards and games, chess, exercise classes, aquatic classes and much more) - see the website for details: https://www.[place]-.gov/departments/parks-recreation/active-adults50  -YouTube has lots of exercise videos for different ages and abilities as well  -[facility] (a variety of indoor and outdoor inperson activities for adults). ([phone]. [address]  -Virtual Online Classes (a variety of topics): see seniorplanet.org or call (727)098-4624  -consider volunteering at a school, hospice center, church, senior center or elsewhere

## 2023-10-24 NOTE — Progress Notes (Signed)
 Patient unable to obtain vital signs due to telehealth visit

## 2023-10-24 NOTE — Progress Notes (Signed)
 PATIENT CHECK-IN and HEALTH RISK ASSESSMENT QUESTIONNAIRE:  -completed by phone/video for upcoming Medicare Preventive Visit  Pre-Visit Check-in: 1)Vitals (height, wt, BP, etc) - record in vitals section for visit on day of visit Request home vitals (wt, BP, etc.) and enter into vitals, THEN update Vital Signs SmartPhrase below at the top of the HPI. See below.  2)Review and Update Medications, Allergies PMH, Surgeries, Social history in Epic 3)Hospitalizations in the last year with date/reason? No  4)Review and Update Care Team (patient's specialists) in Epic 5) Complete PHQ9 in Epic  6) Complete Fall Screening in Epic 7)Review all Health Maintenance Due and order under PCP if not done.  Medicare Wellness Patient Questionnaire:  Answer theses question about your habits: How often do you have a drink containing alcohol? Quit 9 years ago Have you ever smoked?no  Quit date if applicable? N/A  How many packs a day do/did you smoke? N/A Do you use smokeless tobacco?no Do you use an illicit drugs? no On average, how many days per week do you engage in moderate to strenuous exercise (like a brisk walk)?2 days - walks dog several days a week, has a garden On average, how many minutes do you engage in exercise at this level?20 mins  Are you sexually active? No Number of partners? NA Typical breakfast: Varies, sometimes skips breakfast, coffee and milk  Typical lunch: Varies, whole wheat toast, cream of wheat, papaya Typical dinner: Varies, healthy choice, veggies, Tapay sweet and sour chicken, spaghetti and sauce Typical snacks: Donuts, Milshake  Beverages:  Tea    Answer theses question about your everyday activities: Can you perform most household chores?Yes  Are you deaf or have significant trouble hearing?no Do you feel that you have a problem with memory?no Do you feel safe at home? Yes  Last dentist visit? 3 weeks ago  8. Do you have any difficulty performing your everyday  activities?no Are you having any difficulty walking, taking medications on your own, and or difficulty managing daily home needs?no Do you have difficulty walking or climbing stairs?no Do you have difficulty dressing or bathing?no Do you have difficulty doing errands alone such as visiting a doctor's office or shopping?no Do you currently have any difficulty preparing food and eating?no Do you currently have any difficulty using the toilet?no Do you have any difficulty managing your finances?no Do you have any difficulties with housekeeping of managing your housekeeping?no   Do you have Advanced Directives in place (Living Will, Healthcare Power or Attorney)?  Yes    Last eye Exam and location? 1 year ago    Do you currently use prescribed or non-prescribed narcotic or opioid pain medications? No  Do you have a history or close family history of breast, ovarian, tubal or peritoneal cancer or a family member with BRCA (breast cancer susceptibility 1 and 2) gene mutations? No      ----------------------------------------------------------------------------------------------------------------------------------------------------------------------------------------------------------------------  Because this visit was a virtual/telehealth visit, some criteria may be missing or patient reported. Any vitals not documented were not able to be obtained and vitals that have been documented are patient reported.    MEDICARE ANNUAL PREVENTIVE VISIT WITH PROVIDER: (Welcome to Medicare, initial annual wellness or annual wellness exam)  Virtual Visit via Video Note  I connected with Jessica Fry on 10/24/23 by a video enabled telemedicine application and verified that I am speaking with the correct person using two identifiers.  Location patient: home Location provider:work or home office Persons participating in the virtual visit: patient, provider  Concerns and/or follow up today:  doing ok. Dealing with chronic constipation and will be seeing GI for this. Is working with diet and feels is helping.    See HM section in Epic for other details of completed HM.    ROS: negative for report of fevers, unintentional weight loss, vision changes, vision loss, hearing loss or change, chest pain, sob, hemoptysis, melena, hematochezia, hematuria, falls, bleeding or bruising, thoughts of suicide or self harm, memory loss  Patient-completed extensive health risk assessment - reviewed and discussed with the patient: See Health Risk Assessment completed with patient prior to the visit either above or in recent phone note. This was reviewed in detailed with the patient today and appropriate recommendations, orders and referrals were placed as needed per Summary below and patient instructions.   Review of Medical History: -PMH, PSH, Family History and current specialty and care providers reviewed and updated and listed below   Patient Care Team: [place], Betty G, MD as PCP - General (Family Medicine)   Past Medical History:  Diagnosis Date   Alcohol addiction (HCC)    Allergy    Chicken pox    Depression    Eczema    GERD (gastroesophageal reflux disease)    Hyperlipidemia    Thyroid disease    Hypothyroid   Urine incontinence     Past Surgical History:  Procedure Laterality Date   nose-deviated septum injury  1972    Social History   Socioeconomic History   Marital status: Divorced    Spouse name: Not on file   Number of children: 2   Years of education: Not on file   Highest education level: Doctorate  Occupational History   Not on file  Tobacco Use   Smoking status: Never    Passive exposure: Never   Smokeless tobacco: Never  Vaping Use   Vaping status: Never Used  Substance and Sexual Activity   Alcohol use: No    Comment: 7 years sober   Drug use: No   Sexual activity: Not Currently    Comment: both previously  Other Topics Concern   Not on file   Social History Narrative   Not on file   Social Drivers of Health   Financial Resource Strain: Low Risk  (08/21/2023)   Overall Financial Resource Strain (CARDIA)    Difficulty of Paying Living Expenses: Not hard at all  Food Insecurity: No Food Insecurity (08/21/2023)   Hunger Vital Sign    Worried About Running Out of Food in the Last Year: Never true    Ran Out of Food in the Last Year: Never true  Transportation Needs: No Transportation Needs (08/21/2023)   PRAPARE - Administrator, Civil Service (Medical): No    Lack of Transportation (Non-Medical): No  Physical Activity: Insufficiently Active (08/21/2023)   Exercise Vital Sign    Days of Exercise per Week: 2 days    Minutes of Exercise per Session: 20 min  Stress: No Stress Concern Present (08/21/2023)   [employer] of Occupational Health - Occupational Stress Questionnaire    Feeling of Stress : Only a little  Social Connections: Moderately Isolated (08/21/2023)   Social Connection and Isolation Panel [NHANES]    Frequency of Communication with Friends and Family: Once a week    Frequency of Social Gatherings with Friends and Family: Once a week    Attends Religious Services: More than 4 times per year    Active Member of [employer] or Organizations: Yes  Attends Banker Meetings: More than 4 times per year    Marital Status: Divorced  Intimate Partner Violence: Not At Risk (10/24/2023)   Humiliation, Afraid, Rape, and Kick questionnaire    Fear of Current or Ex-Partner: No    Emotionally Abused: No    Physically Abused: No    Sexually Abused: No    Family History  Problem Relation Age of Onset   Early death Mother    Miscarriages / [place] Mother    Alcohol abuse Father    Depression Father    Hearing loss Father    Stroke Father    Early death Brother    Heart attack Maternal Grandmother    Stroke Paternal Grandmother     Current Outpatient Medications on File Prior to Visit   Medication Sig Dispense Refill   BABY ASPIRIN PO Take 81 mg by mouth every other day.     Cholecalciferol (VITAMIN D3 PO) Take 1 tablet by mouth daily.      fluticasone (FLONASE SENSIMIST) 27.5 MCG/SPRAY nasal spray Place 2 sprays into the nose daily. 27.3 mL 1   levothyroxine (SYNTHROID) 100 MCG tablet TAKE 1 AND 1/2 TABLETS BY MOUTH MONDAY AND WEDNESDAY AND 1 TABLET BY MOUTH ALL OTHER DAYS 135 tablet 1   loratadine (CLARITIN) 10 MG tablet Take 1 tablet (10 mg total) by mouth daily as needed for allergies (Can take an extra dose during flare ups.). 180 tablet 1   loteprednol (LOTEMAX) 0.5 % ophthalmic suspension Place 1 drop into both eyes daily. 45 mL 1   MAGNESIUM PO Take by mouth daily as needed (constipation relief).     Olopatadine HCl (PATADAY) 0.2 % SOLN Place 1 drop into both eyes 1 day or 1 dose. 7.5 mL 1   simvastatin (ZOCOR) 40 MG tablet TAKE 1 TABLET BY MOUTH DAILY AT 6 PM. 90 tablet 3   venlafaxine XR (EFFEXOR-XR) 75 MG 24 hr capsule Take 1 capsule (75 mg total) by mouth daily with breakfast. 90 capsule 1   No current facility-administered medications on file prior to visit.    Allergies  Allergen Reactions   Shellfish-Derived Products Other (See Comments)    Patient preference.        Physical Exam Vitals requested from patient and listed below if patient had equipment and was able to obtain at home for this virtual visit: There were no vitals filed for this visit. Estimated body mass index is 33.75 kg/m as calculated from the following:   Height as of 09/10/23: 5' 6.14" (1.68 m).   Weight as of 09/10/23: 210 lb (95.3 kg).  EKG (optional): deferred due to virtual visit  GENERAL: alert, oriented, no acute distress detected, full vision exam deferred due to pandemic and/or virtual encounter  HEENT: atraumatic, conjunttiva clear, no obvious abnormalities on inspection of external nose and ears  NECK: normal movements of the head and neck  LUNGS: on inspection no signs  of respiratory distress, breathing rate appears normal, no obvious gross SOB, gasping or wheezing  CV: no obvious cyanosis  MS: moves all visible extremities without noticeable abnormality  PSYCH/NEURO: pleasant and cooperative, no obvious depression or anxiety, speech and thought processing grossly intact, Cognitive function grossly intact  Flowsheet Row Clinical Support from 10/24/2023 in [facility]  PHQ-9 Total Score 0           10/24/2023   11:30 AM 12/04/2022    1:55 PM 08/16/2022    3:15 PM 07/10/2022  12:07 PM 04/06/2022   11:42 AM  Depression screen PHQ 2/9  Decreased Interest 0 0 1 2 1   Down, Depressed, Hopeless 0 0 0 0 0  PHQ - 2 Score 0 0 1 2 1   Altered sleeping 0 0 0 0 0  Tired, decreased energy 0 0 0 0 0  Change in appetite 0 0 0 2 1  Feeling bad or failure about yourself  0 0 0 1 0  Trouble concentrating 0 0 0 1 0  Moving slowly or fidgety/restless 0 0 0 0 0  Suicidal thoughts 0 0 0 0 0  PHQ-9 Score 0 0 1 6 2   Difficult doing work/chores Not difficult at all Not difficult at all Not difficult at all Somewhat difficult Not difficult at all       08/16/2022    3:12 PM 11/30/2022    4:12 PM 12/04/2022    1:55 PM 10/23/2023   11:30 PM 10/24/2023   11:35 AM  Fall Risk  Falls in the past year? 1 0 1 1 1   Was there an injury with Fall? 0  0 0 0  Fall Risk Category Calculator 1  1 1  1   Patient at Risk for Falls Due to   Other (Comment)  No Fall Risks  Fall risk Follow up   Falls evaluation completed  Falls evaluation completed     Patient-reported     SUMMARY AND PLAN:  Medicare annual wellness visit, subsequent   Discussed applicable health maintenance/preventive health measures and advised and referred or ordered per patient preferences: -she declined colon cancer screening - she is seeing GI and is considering a colonoscopy -she declined breast cancer screening, she agreed to do self breast exams but refused recommended  mammogram even after discussion rationale, risks -discussed vaccines due, risks and recs, advised can get a pharmacy and to let us know if she does so that we can update her record. She declined the shingles vaccine and is considering the others.  -she declined the bone density test Health Maintenance  Topic Date Due   Fecal DNA (Cologuard)  Never done   DTaP/Tdap/Td (1 - Tdap) 11/30/2022   COVID-19 Vaccine (5 - 2024-25 season) 09/06/2023   Zoster Vaccines- Shingrix (1 of 2) 01/23/2024 (Originally 07/02/2004)   MAMMOGRAM  10/23/2024 (Originally 01/15/2020)   DEXA SCAN  10/23/2024 (Originally 07/03/2019)   INFLUENZA VACCINE  02/07/2024   Medicare Annual Wellness (AWV)  10/23/2024   Pneumonia Vaccine 51+ Years old  Completed   Hepatitis C Screening  Completed   HPV VACCINES  Aged Out   Meningococcal B Vaccine  Aged Out      Education and counseling on the following was provided based on the above review of health and a plan/checklist for the patient, along with additional information discussed, was provided for the patient in the patient instructions :  -Provided safe balance exercises that can be done at home to improve balance and discussed exercise guidelines for adults - see pt instructions.  -Advised and counseled on a healthy lifestyle - including the importance of a healthy diet, regular physical activity -Reviewed patient's current diet. Advised and counseled on a whole foods based healthy diet. Discussed dietary recs for chronic constipation and advised elimination of ultraprocessed foods. A summary of a healthy diet was provided in the Patient Instructions.  -reviewed patient's current physical activity level and discussed exercise guidelines for adults. Discussed community resources and ideas for safe exercise at home to assist in meeting  exercise guideline recommendations in a safe and healthy way.  -Advise yearly dental visits at minimum and regular eye exams   Follow up: see  patient instructions     Patient Instructions  I really enjoyed getting to talk with you today! I am available on Tuesdays and Thursdays for virtual visits if you have any questions or concerns, or if I can be of any further assistance.   CHECKLIST FROM ANNUAL WELLNESS VISIT:  -Follow up (please call to schedule if not scheduled after visit):   -yearly for annual wellness visit with primary care office  Here is a list of your preventive care/health maintenance measures and the plan for each if any are due:  PLAN For any measures below that may be due:  -please talk to the gastroenterologist about the colon cancer screening -please consider getting the mammogram and bone density tests and let us know if you change your mind -can get any vaccine due at the pharmacy, please let us know if you do so that we can update your records Health Maintenance  Topic Date Due   Fecal DNA (Cologuard)  Never done   DTaP/Tdap/Td (1 - Tdap) 11/30/2022   COVID-19 Vaccine (5 - 2024-25 season) 09/06/2023   Zoster Vaccines- Shingrix (1 of 2) 07/02/2004   MAMMOGRAM  01/15/2020   DEXA SCAN  07/03/2019   INFLUENZA VACCINE  02/07/2024   Medicare Annual Wellness (AWV)  10/23/2024   Pneumonia Vaccine 52+ Years old  Completed   Hepatitis C Screening  Completed   HPV VACCINES  Aged Out   Meningococcal B Vaccine  Aged Out    -See a dentist at least yearly  -Get your eyes checked and then per your eye specialist's recommendations  -Other issues addressed today:   -I have included below further information regarding a healthy whole foods based diet, physical activity guidelines for adults, stress management and opportunities for social connections. I hope you find this information useful.    -----------------------------------------------------------------------------------------------------------------------------------------------------------------------------------------------------------------------------------------------------------    NUTRITION: -eat real food: lots of colorful vegetables (half the plate) and fruits -5-7 servings of vegetables and fruits per day (fresh or steamed is best), exp. 2 servings of vegetables with lunch and dinner and 2 servings of fruit per day. Berries and greens such as kale and collards are great choices.  -consume on a regular basis:  fresh fruits, fresh veggies, fish, nuts, seeds, healthy oils (such as olive oil, avocado oil), whole grains (make sure for bread/pasta/crackers/etc., that the first ingredient on label contains the word "whole"), legumes. -can eat small amounts of dairy and lean meat (no larger than the palm of your hand), but avoid processed meats such as ham, bacon, lunch meat, etc. -drink water -try to avoid fast food and pre-packaged foods, processed meat, ultra processed foods/beverages (donuts, candy, etc.) -most experts advise limiting sodium to < 2300mg  per day, should limit further is any chronic conditions such as high blood pressure, heart disease, diabetes, etc. The American Heart Association advised that < 1500mg  is is ideal -try to avoid foods/beverages that contain any ingredients with names you do not recognize  -try to avoid foods/beverages  with added sugar or sweeteners/sweets  -try to avoid sweet drinks (including diet drinks): soda, juice, Gatorade, sweet tea, power drinks, diet drinks -try to avoid white rice, white bread, pasta (unless whole grain)  EXERCISE GUIDELINES FOR ADULTS: -if you wish to increase your physical activity, do so gradually and with the approval of your doctor -STOP and seek medical care  immediately if you have any chest pain, chest discomfort or trouble breathing when starting or  increasing exercise  -move and stretch your body, legs, feet and arms when sitting for long periods -Physical activity guidelines for optimal health in adults: -get at least 150 minutes per week of moderate exercise (can talk, but not sing); this is about 20-30 minutes of sustained activity 5-7 days per week or two 10-15 minute episodes of sustained activity 5-7 days per week -do some muscle building/resistance training/strength training at least 2 days per week  -balance exercises 3+ days per week:   Stand somewhere where you have something sturdy to hold onto if you lose balance    1) lift up on toes, then back down, start with 5x per day and work up to 20x   2) stand and lift one leg straight out to the side so that foot is a few inches of the floor, start with 5x each side and work up to 20x each side   3) stand on one foot, start with 5 seconds each side and work up to 20 seconds on each side  If you need ideas or help with getting more active:  -Silver sneakers https://tools.silversneakers.com  -Walk with a Doc: http://www.duncan-williams.com/  -try to include resistance (weight lifting/strength building) and balance exercises twice per week: or the following link for ideas: http://castillo-powell.com/  BuyDucts.dk  STRESS MANAGEMENT: -can try meditating, or just sitting quietly with deep breathing while intentionally relaxing all parts of your body for 5 minutes daily -if you need further help with stress, anxiety or depression please follow up with your primary doctor or contact the wonderful folks at [facility]: [phone]  SOCIAL CONNECTIONS: -options in [place] if you wish to engage in more social and exercise related activities:  -Silver sneakers https://tools.silversneakers.com  -Walk with a Doc: http://www.duncan-williams.com/  -Check out the [facility] Active Adults 50+  section on the [place] [employer] (hiking clubs, book clubs, cards and games, chess, exercise classes, aquatic classes and much more) - see the website for details: https://www.[place].gov/departments/parks-recreation/active-adults50  -YouTube has lots of exercise videos for different ages and abilities as well  -[facility] (a variety of indoor and outdoor inperson activities for adults). [phone]. [address]  -Virtual Online Classes (a variety of topics): see seniorplanet.org or call [phone]  -consider volunteering at a school, hospice center, church, senior center or elsewhere            Maurie Southern, DO

## 2023-10-25 ENCOUNTER — Other Ambulatory Visit: Payer: Self-pay | Admitting: Allergy and Immunology

## 2023-11-10 NOTE — Progress Notes (Unsigned)
 Brigitte Canard, PA-C [address] [place], [place]  [address] Phone: [phone]   Gastroenterology Consultation  Referring Provider:     [place], Betty G, MD Primary Care Physician:  [place], Betty G, MD Primary Gastroenterologist:  Brigitte Canard, PA-C / Legrand Puma, MD  Reason for Consultation:     Discuss colonoscopy        HPI:   Jessica Fry is a 68 y.o. y/o female referred for consultation & management  by [place], Betty G, MD.    Symptoms: She has Chronic Constipation, gas, and hemorrhoids.  Takes magnesium and OTC gut health supplement.  Recently treated with MiraLAX.  She has had chronic constipation for many years.  Typically has 1 small bowel movement once per week.  Currently taking MiraLAX once or twice per week which helps, however it increases gas.  Patient states she cannot take fiber supplement because that also increases malodorous gas.  She has mild occasional intermittent abdominal cramping attributed to gas and constipation.  Has rare episode of bright red blood on the tissue if she strains to have a bowel movement, attributed to hemorrhoids.  Denies weight loss or alarm symptoms.  Patient saw previous GI in [place] .  Last colonoscopy was approximately 11 years ago in [place] .  Patient reports it was normal.  She has only had 1 colonoscopy.  She denies family history of colon cancer.  She states she is due for a 10-year repeat screening colonoscopy.    PMH: History of alcohol dependence, hypothyroidism, depression  09/2023 labs: CBC, LFTs, and BMP.  Hemoglobin 13.9.  Past Medical History:  Diagnosis Date   Alcohol addiction (HCC)    Allergy     Chicken pox    Depression    Eczema    GERD (gastroesophageal reflux disease)    Hyperlipidemia    Thyroid  disease    Hypothyroid   Urine incontinence     Past Surgical History:  Procedure Laterality Date   nose-deviated septum injury  1972    Prior to Admission medications   Medication Sig Start Date End  Date Taking? Authorizing Provider  BABY ASPIRIN PO Take 81 mg by mouth every other day.    [provider]  Cholecalciferol (VITAMIN D3 PO) Take 1 tablet by mouth daily.     [provider]  fluticasone  (FLONASE  SENSIMIST) 27.5 MCG/SPRAY nasal spray Place 2 sprays into the nose daily. 05/07/23   Kozlow, Rema Care, MD  levothyroxine  (SYNTHROID ) 100 MCG tablet TAKE 1 AND 1/2 TABLETS BY MOUTH MONDAY AND WEDNESDAY AND 1 TABLET BY MOUTH ALL OTHER DAYS 08/05/23   [place], Betty G, MD  loratadine  (CLARITIN ) 10 MG tablet Take 1 tablet (10 mg total) by mouth daily as needed for allergies (Can take an extra dose during flare ups.). 05/07/23   Kozlow, Rema Care, MD  loteprednol  (LOTEMAX ) 0.5 % ophthalmic suspension INSTILL 1 DROP INTO BOTH EYES EVERY DAY 10/28/23   Kozlow, Eric J, MD  MAGNESIUM PO Take by mouth daily as needed (constipation relief).    [provider]  Olopatadine  HCl (PATADAY ) 0.2 % SOLN Place 1 drop into both eyes 1 day or 1 dose. 05/07/23   Kozlow, Rema Care, MD  simvastatin  (ZOCOR ) 40 MG tablet TAKE 1 TABLET BY MOUTH DAILY AT 6 PM. 10/01/23   [place], Betty G, MD  venlafaxine  XR (EFFEXOR -XR) 75 MG 24 hr capsule Take 1 capsule (75 mg total) by mouth daily with breakfast. 07/15/23   [place], Betty G,  MD    Family History  Problem Relation Age of Onset   Early death Mother    Miscarriages / [place] Mother    Alcohol abuse Father    Depression Father    Hearing loss Father    Stroke Father    Early death Brother    Heart attack Maternal Grandmother    Stroke Paternal Grandmother      Social History   Tobacco Use   Smoking status: Never    Passive exposure: Never   Smokeless tobacco: Never  Vaping Use   Vaping status: Never Used  Substance Use Topics   Alcohol use: No    Comment: 7 years sober   Drug use: No    Allergies as of 11/11/2023 - Review Complete 11/11/2023  Allergen Reaction Noted   Shellfish-derived products Other (See Comments) 03/07/2015     Review of Systems:    All systems reviewed and negative except where noted in HPI.   Physical Exam:  BP 120/70   Pulse 61   Ht 5' 6.5" (1.689 m)   Wt 209 lb (94.8 kg)   BMI 33.23 kg/m  No LMP recorded. Patient is postmenopausal.  General:   Alert,  Well-developed, well-nourished, pleasant and cooperative in NAD Lungs:  Respirations even and unlabored.  Clear throughout to auscultation.   No wheezes, crackles, or rhonchi. No acute distress. Heart:  Regular rate and rhythm; no murmurs, clicks, rubs, or gallops. Abdomen:  Normal bowel sounds.  No bruits.  Soft, and non-distended without masses, hepatosplenomegaly or hernias noted.  Mild bilateral lower abdominal tenderness.  No guarding or rebound tenderness.   No upper abdominal tenderness. Neurologic:  Alert and oriented x3;  grossly normal neurologically. Psych:  Alert and cooperative. Normal mood and affect.  Imaging Studies: No results found.  Labs: CBC    Component Value Date/Time   WBC 7.1 09/10/2023 1205   RBC 4.79 09/10/2023 1205   HGB 13.9 09/10/2023 1205   HCT 41.6 09/10/2023 1205   PLT 365.0 09/10/2023 1205   MCV 86.8 09/10/2023 1205   MCH 28.3 02/20/2022 1809   MCHC 33.5 09/10/2023 1205   RDW 14.0 09/10/2023 1205   LYMPHSABS 1.5 02/20/2022 1809   MONOABS 0.5 02/20/2022 1809   EOSABS 0.1 02/20/2022 1809   BASOSABS 0.0 02/20/2022 1809    CMP     Component Value Date/Time   NA 138 09/10/2023 1205   K 4.0 09/10/2023 1205   CL 105 09/10/2023 1205   CO2 26 09/10/2023 1205   GLUCOSE 93 09/10/2023 1205   BUN 14 09/10/2023 1205   CREATININE 0.77 09/10/2023 1205   CREATININE 0.88 02/09/2020 1306   CALCIUM 9.6 09/10/2023 1205   PROT 7.1 09/10/2023 1205   ALBUMIN 4.1 09/10/2023 1205   AST 18 09/10/2023 1205   ALT 17 09/10/2023 1205   ALKPHOS 75 09/10/2023 1205   BILITOT 0.4 09/10/2023 1205   GFRNONAA >60 02/20/2022 1809   GFRAA >60 06/03/2019 1829    Assessment and Plan:   Eiman Ehrgott is  a 68 y.o. y/o female has been referred for:  1.  Chronic idiopathic constipation - Gave samples of Linzess 72 mcg QD for 1 week, then 145 mcg QD for 1 week.  She will let me know which dose works best, and then we can send a prescription.   -Recommend High Fiber diet with fruits, vegetables, and whole grains. -Drink 64 ounces of Fluids Daily.  2.  Intestinal gas - Stop MiraLAX which could  cause increased gas. - Recommend low FODMAP diet.  Avoid gas producing foods.  3.  Colon cancer screening -Scheduling Colonoscopy I discussed risks of colonoscopy with patient to include risk of bleeding, colon perforation, and risk of sedation.  Patient expressed understanding and agrees to proceed with colonoscopy.   Follow up as needed based on colonoscopy results and GI symptoms.  Brigitte Canard, PA-C

## 2023-11-11 ENCOUNTER — Encounter: Payer: Self-pay | Admitting: Physician Assistant

## 2023-11-11 ENCOUNTER — Ambulatory Visit: Admitting: Physician Assistant

## 2023-11-11 VITALS — BP 120/70 | HR 61 | Ht 66.5 in | Wt 209.0 lb

## 2023-11-11 DIAGNOSIS — K5904 Chronic idiopathic constipation: Secondary | ICD-10-CM | POA: Diagnosis not present

## 2023-11-11 DIAGNOSIS — R143 Flatulence: Secondary | ICD-10-CM

## 2023-11-11 DIAGNOSIS — Z1211 Encounter for screening for malignant neoplasm of colon: Secondary | ICD-10-CM

## 2023-11-11 MED ORDER — NA SULFATE-K SULFATE-MG SULF 17.5-3.13-1.6 GM/177ML PO SOLN
1.0000 | Freq: Once | ORAL | 0 refills | Status: AC
Start: 2023-11-11 — End: 2023-11-11

## 2023-11-11 NOTE — Patient Instructions (Signed)
 We have given you samples of the following medication to take: Linzess 72 mcg and 145 mcg take as directed   You have been scheduled for a colonoscopy. Please follow written instructions given to you at your visit today.   If you use inhalers (even only as needed), please bring them with you on the day of your procedure.  DO NOT TAKE 7 DAYS PRIOR TO TEST- Trulicity (dulaglutide) Ozempic, Wegovy (semaglutide) Mounjaro (tirzepatide) Bydureon Bcise (exanatide extended release)  DO NOT TAKE 1 DAY PRIOR TO YOUR TEST Rybelsus (semaglutide) Adlyxin (lixisenatide) Victoza (liraglutide) Byetta (exanatide) ___________________________________________________________________________  Please follow up sooner if symptoms increase or worsen   Due to recent changes in healthcare laws, you may see the results of your imaging and laboratory studies on MyChart before your provider has had a chance to review them.  We understand that in some cases there may be results that are confusing or concerning to you. Not all laboratory results come back in the same time frame and the provider may be waiting for multiple results in order to interpret others.  Please give us  48 hours in order for your provider to thoroughly review all the results before contacting the office for clarification of your results.   _______________________________________________________  If your blood pressure at your visit was 140/90 or greater, please contact your primary care physician to follow up on this.  _______________________________________________________  If you are age 70 or older, your body mass index should be between 23-30. Your Body mass index is 33.23 kg/m. If this is out of the aforementioned range listed, please consider follow up with your Primary Care Provider.  If you are age 10 or younger, your body mass index should be between 19-25. Your Body mass index is 33.23 kg/m. If this is out of the aformentioned range  listed, please consider follow up with your Primary Care Provider.   ________________________________________________________  The [facility] providers would like to encourage you to use MYCHART to communicate with providers for non-urgent requests or questions.  Due to long hold times on the telephone, sending your provider a message by [facility] may be a faster and more efficient way to get a response.  Please allow 48 business hours for a response.  Please remember that this is for non-urgent requests.  _______________________________________________________ Thank you for trusting me with your gastrointestinal care!   Brigitte Canard, PA

## 2023-11-26 NOTE — Progress Notes (Signed)
 Noted

## 2023-12-24 ENCOUNTER — Ambulatory Visit: Payer: Self-pay

## 2023-12-24 NOTE — Telephone Encounter (Signed)
 FYI Only or Action Required?: FYI only for provider  Patient was last seen in primary care on 10/24/2023 by Maurie Southern, DO. Called Nurse Triage reporting Tick Removal. Symptoms began several days ago. Interventions attempted: OTC medications: antifungal cream and Rest, hydration, or home remedies. Symptoms are: gradually worsening.  Triage Disposition: See Physician Within 24 Hours  Patient/caregiver understands and will follow disposition?: Yes  Copied from CRM [id number]. Topic: Clinical - Red Word Triage >> Dec 24, 2023  2:40 PM Alyse July wrote: Red Word that prompted transfer to Nurse Triage: Tick Bite Reason for Disposition  [1] Red or very tender (to touch) area AND [2] started over 24 hours after the bite  Answer Assessment - Initial Assessment Questions 1. ATTACHED:  Is the tick still on the skin?  (e.g., yes, no, unsure)     no 2. ONSET - TICK STILL ATTACHED:  How long do you think the tick has been on your skin? (e.g., hours, days, unsure)  Note:  Is there a recent activity (camping, hiking) where the caller may have been exposed?     NA 3. ONSET - TICK NOT STILL ATTACHED: If the tick has been removed, how long do you think the tick was attached before you removed it? (e.g., 5 hours, 2 days). When was this?     10 days ago 4. LOCATION: Where is the tick bite located? (e.g., arm, leg)     Groin 5. TYPE of TICK: Is it a wood tick or a deer tick? (e.g., deer tick, wood tick; unsure)     unsure 6. SIZE of TICK: How big is the tick? (e.g., size of poppy seed, apple seed, watermelon seed; unsure) Note: Deer ticks can be the size of a poppy seed (nymph) or an apple seed (adult).       small 7. ENGORGED: Did the tick look flat or engorged (full, swollen)? (e.g., flat, engorged; unsure)     flat 8. OTHER SYMPTOMS: Do you have any other symptoms? (e.g., fever, rash, redness at bite area, red ring around bite)     Red, itchy, tender at site. Noted abdominal fold also  has pink rash. Denies fever, headache.  Has been monitoring at home and using antifungal cream without relief.  Protocols used: Tick Bite-A-AH

## 2023-12-25 ENCOUNTER — Encounter: Payer: Self-pay | Admitting: Family Medicine

## 2023-12-25 ENCOUNTER — Ambulatory Visit (INDEPENDENT_AMBULATORY_CARE_PROVIDER_SITE_OTHER): Admitting: Family Medicine

## 2023-12-25 VITALS — BP 120/70 | HR 65 | Temp 98.3°F | Resp 16 | Ht 66.5 in | Wt 213.0 lb

## 2023-12-25 DIAGNOSIS — S70361A Insect bite (nonvenomous), right thigh, initial encounter: Secondary | ICD-10-CM

## 2023-12-25 DIAGNOSIS — W57XXXA Bitten or stung by nonvenomous insect and other nonvenomous arthropods, initial encounter: Secondary | ICD-10-CM | POA: Diagnosis not present

## 2023-12-25 DIAGNOSIS — L304 Erythema intertrigo: Secondary | ICD-10-CM | POA: Diagnosis not present

## 2023-12-25 MED ORDER — CLOTRIMAZOLE-BETAMETHASONE 1-0.05 % EX CREA
TOPICAL_CREAM | CUTANEOUS | 1 refills | Status: AC
Start: 2023-12-25 — End: ?

## 2023-12-25 MED ORDER — NYSTATIN 100000 UNIT/GM EX POWD: 1.0000 | Freq: Three times a day (TID) | CUTANEOUS | 3 refills | Status: AC

## 2023-12-25 MED ORDER — DOXYCYCLINE HYCLATE 100 MG PO TABS: 100.0000 mg | ORAL_TABLET | Freq: Two times a day (BID) | ORAL | 0 refills | Status: AC

## 2023-12-25 NOTE — Progress Notes (Signed)
 ACUTE VISIT Chief Complaint  Patient presents with   Insect Bite    Patient complains of tick bite in the right groin area x10 days ago, removed tick 9 days ago and rash present x1 week   HPI: Ms.Esra Lavern Crimi is a 68 y.o. female with a PMHx significant for hypothyroidism, HLD, prediabetes, who is here today complaining of a tick bite as described above.  Tick Bite: After the reviewing the triage, it seems the tick bite/removal from groin occurred 10 days ago, with symptoms onset about 3 days later. She does not believe that the tick was on longer than 36 hours, as she does have a dog that sleeps in the same bed with her. Tick described as being small, dark, and flat.  Symptoms are: redness and tenderness local to bite as well as pruritus. Since onset symptoms have gradually worsened.  No fever or headaches reportedly.   Intertrigo: Says that but she's had a pink rash on her abdomen and itchiness for about 1 week.  She has been using antifungal powder, Lamisil cream, and Neosporin w/o relief, but has been using cotton gauze and tape to separate skin.  This problem has been going on for a while, intermittent. Negative for pain or drainage. Stable.  Concerned about rash being caused by elevated glucose. No hx of diabetes. Lab Results  Component Value Date   HGBA1C 6.0 09/10/2023   Review of Systems  Constitutional:  Negative for activity change, appetite change, chills and fever.  HENT:  Negative for mouth sores, sore throat and trouble swallowing.   Respiratory:  Negative for cough and shortness of breath.   Cardiovascular:  Negative for chest pain and leg swelling.  Gastrointestinal:  Negative for abdominal pain, nausea and vomiting.  Musculoskeletal:  Negative for joint swelling and myalgias.  Skin:  Negative for wound.  See other pertinent positives and negatives in HPI.  Current Outpatient Medications on File Prior to Visit  Medication Sig Dispense Refill    BABY ASPIRIN PO Take 81 mg by mouth every other day.     Cholecalciferol (VITAMIN D3 PO) Take 1 tablet by mouth daily.      levothyroxine  (SYNTHROID ) 100 MCG tablet TAKE 1 AND 1/2 TABLETS BY MOUTH MONDAY AND WEDNESDAY AND 1 TABLET BY MOUTH ALL OTHER DAYS 135 tablet 1   loratadine  (CLARITIN ) 10 MG tablet Take 1 tablet (10 mg total) by mouth daily as needed for allergies (Can take an extra dose during flare ups.). 180 tablet 1   loteprednol  (LOTEMAX ) 0.5 % ophthalmic suspension INSTILL 1 DROP INTO BOTH EYES EVERY DAY 5 mL 1   MAGNESIUM PO Take by mouth daily as needed (constipation relief).     Olopatadine  HCl (PATADAY ) 0.2 % SOLN Place 1 drop into both eyes 1 day or 1 dose. 7.5 mL 1   simvastatin  (ZOCOR ) 40 MG tablet TAKE 1 TABLET BY MOUTH DAILY AT 6 PM. 90 tablet 3   venlafaxine  XR (EFFEXOR -XR) 75 MG 24 hr capsule Take 1 capsule (75 mg total) by mouth daily with breakfast. 90 capsule 1   No current facility-administered medications on file prior to visit.    Past Medical History:  Diagnosis Date   Alcohol addiction (HCC)    Allergy     Chicken pox    Depression    Eczema    GERD (gastroesophageal reflux disease)    Hyperlipidemia    Thyroid  disease    Hypothyroid   Urine incontinence    Allergies  Allergen  Reactions   Shellfish-Derived Products Other (See Comments)    Patient preference.     Social History   Socioeconomic History   Marital status: Divorced    Spouse name: Not on file   Number of children: 2   Years of education: Not on file   Highest education level: Doctorate  Occupational History   Not on file  Tobacco Use   Smoking status: Never    Passive exposure: Never   Smokeless tobacco: Never  Vaping Use   Vaping status: Never Used  Substance and Sexual Activity   Alcohol use: No    Comment: 7 years sober   Drug use: No   Sexual activity: Not Currently    Comment: both previously  Other Topics Concern   Not on file  Social History Narrative   Not on  file   Social Drivers of Health   Financial Resource Strain: Low Risk  (08/21/2023)   Overall Financial Resource Strain (CARDIA)    Difficulty of Paying Living Expenses: Not hard at all  Food Insecurity: No Food Insecurity (08/21/2023)   Hunger Vital Sign    Worried About Running Out of Food in the Last Year: Never true    Ran Out of Food in the Last Year: Never true  Transportation Needs: No Transportation Needs (08/21/2023)   PRAPARE - Administrator, Civil Service (Medical): No    Lack of Transportation (Non-Medical): No  Physical Activity: Insufficiently Active (08/21/2023)   Exercise Vital Sign    Days of Exercise per Week: 2 days    Minutes of Exercise per Session: 20 min  Stress: No Stress Concern Present (08/21/2023)   [employer] of Occupational Health - Occupational Stress Questionnaire    Feeling of Stress : Only a little  Social Connections: Moderately Isolated (08/21/2023)   Social Connection and Isolation Panel    Frequency of Communication with Friends and Family: Once a week    Frequency of Social Gatherings with Friends and Family: Once a week    Attends Religious Services: More than 4 times per year    Active Member of [employer] or Organizations: Yes    Attends Banker Meetings: More than 4 times per year    Marital Status: Divorced   Vitals:   12/25/23 1343  BP: 120/70  Pulse: 65  Resp: 16  Temp: 98.3 F (36.8 C)  SpO2: 99%   Body mass index is 33.86 kg/m.  Physical Exam Vitals and nursing note reviewed.  Constitutional:      General: She is not in acute distress.    Appearance: She is well-developed.  HENT:     Head: Normocephalic and atraumatic.   Eyes:     Conjunctiva/sclera: Conjunctivae normal.   Pulmonary:     Effort: Pulmonary effort is normal. No respiratory distress.  Abdominal:     Palpations: Abdomen is soft. There is no mass.     Tenderness: There is no abdominal tenderness.   Skin:    General: Skin is  warm.     Findings: Erythema and rash present.       Neurological:     General: No focal deficit present.     Mental Status: She is alert and oriented to person, place, and time.   Psychiatric:        Mood and Affect: Mood and affect normal.   ASSESSMENT AND PLAN:  Ms. Najarro was seen today for a tick bite.  Tick bite of right thigh, initial  encounter With local reaction. Recommend doxycycline  100 mg twice daily for 10 days. Monitor for new symptoms. Explained that it may take a few weeks for lesion to completely heal. I do not think blood work is needed at this time. Follow-up as needed.  -     Doxycycline  Hyclate; Take 1 tablet (100 mg total) by mouth 2 (two) times daily for 10 days.  Dispense: 20 tablet; Refill: 0  Intertrigo We discussed differential diagnosis, prognosis,and treatment options. Stop neosporin. Recommend antibacterial soap. Keep area dry and monitor for signs of infection. Recommend nystatin  powder to use 2-3 times daily. Cotton underwear may help with prevention. Lotrisone  daily as needed, small amount, we discussed some side effects of chronic topical steroid use.  -     Nystatin ; Apply 1 Application topically 3 (three) times daily.  Dispense: 120 g; Refill: 3 -     Clotrimazole -Betamethasone ; Small amount on lower abdomen daily as needed.  Dispense: 30 g; Refill: 1  Return if symptoms worsen or fail to improve, for keep next appointment.  I, Fritz Jewel Wierda, acting as a scribe for Infiniti Hoefling [place], MD., have documented all relevant documentation on the behalf of Robbyn Hodkinson [place], MD, as directed by  Densil Ottey [place], MD while in the presence of Nesbit Michon [place], MD.   I, Gabriel Conry [place], MD, have reviewed all documentation for this visit. The documentation on 12/25/23 for the exam, diagnosis, procedures, and orders are all accurate and complete.  Dheeraj Hail G. [place], MD  [facility]. [place] office.

## 2023-12-25 NOTE — Patient Instructions (Addendum)
 A few things to remember from today's visit:  Intertrigo - Plan: nystatin  (MYCOSTATIN /NYSTOP ) powder, clotrimazole -betamethasone  (LOTRISONE ) cream  Tick bite of right thigh, initial encounter - Plan: doxycycline  (VIBRA -TABS) 100 MG tablet  Use antibacterial soap, keep lower abdomen creases dry. Powder days and cream daily as needed ,small amount.  For tick bite I am recommending antibiotic.  If you need refills for medications you take chronically, please call your pharmacy. Do not use My Chart to request refills or for acute issues that need immediate attention. If you send a my chart message, it may take a few days to be addressed, specially if I am not in the office.  Please be sure medication list is accurate. If a new problem present, please set up appointment sooner than planned today.

## 2023-12-26 ENCOUNTER — Telehealth: Payer: Self-pay

## 2023-12-26 NOTE — Telephone Encounter (Signed)
 PCP to advise.    Copied from CRM [id number]. Topic: Clinical - Medication Question >> Dec 26, 2023  9:03 AM Juluis Ok wrote: Reason for CRM: Patient has questions regarding doxycycline  (VIBRA -TABS) 100 MG tablet and sun exposure. States she is going out of town on a fishing trip and wants to know if she should start the medication now or after her trip. Request a callback.  [phone]

## 2024-01-02 ENCOUNTER — Encounter: Admitting: Internal Medicine

## 2024-02-04 ENCOUNTER — Encounter: Payer: Self-pay | Admitting: Internal Medicine

## 2024-02-04 ENCOUNTER — Ambulatory Visit (AMBULATORY_SURGERY_CENTER): Admitting: Internal Medicine

## 2024-02-04 VITALS — BP 102/60 | HR 63 | Temp 97.3°F | Resp 19 | Ht 66.5 in | Wt 209.0 lb

## 2024-02-04 DIAGNOSIS — K648 Other hemorrhoids: Secondary | ICD-10-CM | POA: Diagnosis not present

## 2024-02-04 DIAGNOSIS — K5730 Diverticulosis of large intestine without perforation or abscess without bleeding: Secondary | ICD-10-CM | POA: Diagnosis not present

## 2024-02-04 DIAGNOSIS — Z1211 Encounter for screening for malignant neoplasm of colon: Secondary | ICD-10-CM | POA: Diagnosis present

## 2024-02-04 MED ORDER — SODIUM CHLORIDE 0.9 % IV SOLN: 500.0000 mL | Freq: Once | INTRAVENOUS | Status: DC

## 2024-02-04 NOTE — Patient Instructions (Signed)

## 2024-02-04 NOTE — Progress Notes (Signed)
 Expand All Collapse All       Ellouise Console, PA-C [address] [place], [place]  [address] Phone: [phone]   Gastroenterology Consultation   Referring Provider:     [place], Betty G, MD Primary Care Physician:  [place], Betty G, MD Primary Gastroenterologist:  Ellouise Console, PA-C / Norleen Kiang, MD  Reason for Consultation:     Discuss colonoscopy        HPI:   Jessica Fry is a 67 y.o. y/o female referred for consultation & management  by [place], Betty G, MD.     Symptoms: She has Chronic Constipation, gas, and hemorrhoids.  Takes magnesium and OTC gut health supplement.  Recently treated with MiraLAX.  She has had chronic constipation for many years.  Typically has 1 small bowel movement once per week.  Currently taking MiraLAX once or twice per week which helps, however it increases gas.  Patient states she cannot take fiber supplement because that also increases malodorous gas.  She has mild occasional intermittent abdominal cramping attributed to gas and constipation.  Has rare episode of bright red blood on the tissue if she strains to have a bowel movement, attributed to hemorrhoids.  Denies weight loss or alarm symptoms.   Patient saw previous GI in [place] .  Last colonoscopy was approximately 11 years ago in [place] .  Patient reports it was normal.  She has only had 1 colonoscopy.  She denies family history of colon cancer.  She states she is due for a 10-year repeat screening colonoscopy.     PMH: History of alcohol dependence, hypothyroidism, depression   09/2023 labs: CBC, LFTs, and BMP.  Hemoglobin 13.9.       Past Medical History:  Diagnosis Date   Alcohol addiction (HCC)     Allergy      Chicken pox     Depression     Eczema     GERD (gastroesophageal reflux disease)     Hyperlipidemia     Thyroid  disease      Hypothyroid   Urine incontinence                 Past Surgical History:  Procedure Laterality Date   nose-deviated septum injury   1972                  Prior to Admission medications   Medication Sig Start Date End Date Taking? Authorizing Provider  BABY ASPIRIN PO Take 81 mg by mouth every other day.       [provider]  Cholecalciferol (VITAMIN D3 PO) Take 1 tablet by mouth daily.        [provider]  fluticasone  (FLONASE  SENSIMIST) 27.5 MCG/SPRAY nasal spray Place 2 sprays into the nose daily. 05/07/23     Kozlow, Camellia PARAS, MD  levothyroxine  (SYNTHROID ) 100 MCG tablet TAKE 1 AND 1/2 TABLETS BY MOUTH MONDAY AND WEDNESDAY AND 1 TABLET BY MOUTH ALL OTHER DAYS 08/05/23     [place], Betty G, MD  loratadine  (CLARITIN ) 10 MG tablet Take 1 tablet (10 mg total) by mouth daily as needed for allergies (Can take an extra dose during flare ups.). 05/07/23     Kozlow, Camellia PARAS, MD  loteprednol  (LOTEMAX ) 0.5 % ophthalmic suspension INSTILL 1 DROP INTO BOTH EYES EVERY DAY 10/28/23     Kozlow, Eric J, MD  MAGNESIUM PO Take by mouth daily as needed (constipation relief).       [provider]  Olopatadine  HCl (PATADAY ) 0.2 %  SOLN Place 1 drop into both eyes 1 day or 1 dose. 05/07/23     Kozlow, Camellia PARAS, MD  simvastatin  (ZOCOR ) 40 MG tablet TAKE 1 TABLET BY MOUTH DAILY AT 6 PM. 10/01/23     [place], Betty G, MD  venlafaxine  XR (EFFEXOR -XR) 75 MG 24 hr capsule Take 1 capsule (75 mg total) by mouth daily with breakfast. 07/15/23     [place], Betty G, MD           Family History  Problem Relation Age of Onset   Early death Mother     Miscarriages / [place] Mother     Alcohol abuse Father     Depression Father     Hearing loss Father     Stroke Father     Early death Brother     Heart attack Maternal Grandmother     Stroke Paternal Grandmother            Social History  Social History         Tobacco Use   Smoking status: Never      Passive exposure: Never   Smokeless tobacco: Never  Vaping Use   Vaping status: Never Used  Substance Use Topics   Alcohol use: No      Comment: 7 years sober   Drug use: No              Allergies as of 11/11/2023 - Review Complete 11/11/2023  Allergen Reaction Noted   Shellfish-derived products Other (See Comments) 03/07/2015      Review of Systems:    All systems reviewed and negative except where noted in HPI.    Physical Exam:  BP 120/70   Pulse 61   Ht 5' 6.5 (1.689 m)   Wt 209 lb (94.8 kg)   BMI 33.23 kg/m  No LMP recorded. Patient is postmenopausal.   General:   Alert,  Well-developed, well-nourished, pleasant and cooperative in NAD Lungs:  Respirations even and unlabored.  Clear throughout to auscultation.   No wheezes, crackles, or rhonchi. No acute distress. Heart:  Regular rate and rhythm; no murmurs, clicks, rubs, or gallops. Abdomen:  Normal bowel sounds.  No bruits.  Soft, and non-distended without masses, hepatosplenomegaly or hernias noted.  Mild bilateral lower abdominal tenderness.  No guarding or rebound tenderness.   No upper abdominal tenderness. Neurologic:  Alert and oriented x3;  grossly normal neurologically. Psych:  Alert and cooperative. Normal mood and affect.   Imaging Studies: Imaging Results  No results found.     Labs: CBC Labs (Brief)          Component Value Date/Time    WBC 7.1 09/10/2023 1205    RBC 4.79 09/10/2023 1205    HGB 13.9 09/10/2023 1205    HCT 41.6 09/10/2023 1205    PLT 365.0 09/10/2023 1205    MCV 86.8 09/10/2023 1205    MCH 28.3 02/20/2022 1809    MCHC 33.5 09/10/2023 1205    RDW 14.0 09/10/2023 1205    LYMPHSABS 1.5 02/20/2022 1809    MONOABS 0.5 02/20/2022 1809    EOSABS 0.1 02/20/2022 1809    BASOSABS 0.0 02/20/2022 1809        CMP     Labs (Brief)          Component Value Date/Time    NA 138 09/10/2023 1205    K 4.0 09/10/2023 1205    CL 105 09/10/2023 1205    CO2 26 09/10/2023 1205  GLUCOSE 93 09/10/2023 1205    BUN 14 09/10/2023 1205    CREATININE 0.77 09/10/2023 1205    CREATININE 0.88 02/09/2020 1306    CALCIUM 9.6 09/10/2023 1205    PROT 7.1 09/10/2023 1205     ALBUMIN 4.1 09/10/2023 1205    AST 18 09/10/2023 1205    ALT 17 09/10/2023 1205    ALKPHOS 75 09/10/2023 1205    BILITOT 0.4 09/10/2023 1205    GFRNONAA >60 02/20/2022 1809    GFRAA >60 06/03/2019 1829        Assessment and Plan:    Jessica Fry is a 68 y.o. y/o female has been referred for:   1.  Chronic idiopathic constipation - Gave samples of Linzess 72 mcg QD for 1 week, then 145 mcg QD for 1 week.  She will let me know which dose works best, and then we can send a prescription.   -Recommend High Fiber diet with fruits, vegetables, and whole grains. -Drink 64 ounces of Fluids Daily.   2.  Intestinal gas - Stop MiraLAX which could cause increased gas. - Recommend low FODMAP diet.  Avoid gas producing foods.   3.  Colon cancer screening -Scheduling Colonoscopy I discussed risks of colonoscopy with patient to include risk of bleeding, colon perforation, and risk of sedation.  Patient expressed understanding and agrees to proceed with colonoscopy.    Follow up as needed based on colonoscopy results and GI symptoms.   Ellouise Console, PA-C      Recent H&P as above.  No interval change.  Now for colonoscopy

## 2024-02-04 NOTE — Op Note (Signed)
 [facility] Patient Name: Jessica Fry Procedure Date: 02/04/2024 1:18 PM MRN: [id number] Endoscopist: Norleen SAILOR. Abran , MD, [id number] Age: 68 Referring MD:  Date of Birth: 1956/07/03 Gender: Female Account #: [id number] Procedure:                Colonoscopy Indications:              Colon cancer screening. Average risk. Reports                            having had colonoscopy in [place]  about 11 years ago.                            Apparently no neoplasia. Now for follow-up Medicines:                Monitored Anesthesia Care Procedure:                Pre-Anesthesia Assessment:                           - Prior to the procedure, a History and Physical                            was performed, and patient medications and                            allergies were reviewed. The patient's tolerance of                            previous anesthesia was also reviewed. The risks                            and benefits of the procedure and the sedation                            options and risks were discussed with the patient.                            All questions were answered, and informed consent                            was obtained. Prior Anticoagulants: The patient has                            taken no anticoagulant or antiplatelet agents. ASA                            Grade Assessment: II - A patient with mild systemic                            disease. After reviewing the risks and benefits,                            the patient was deemed in satisfactory condition to  undergo the procedure.                           After obtaining informed consent, the colonoscope                            was passed under direct vision. Throughout the                            procedure, the patient's blood pressure, pulse, and                            oxygen saturations were monitored continuously. The                            CF [id number]  #[id number] was introduced through the anus                            and advanced to the the cecum, identified by                            appendiceal orifice and ileocecal valve. The                            ileocecal valve, appendiceal orifice, and rectum                            were photographed. The quality of the bowel                            preparation was excellent. The colonoscopy was                            performed without difficulty. The patient tolerated                            the procedure well. The bowel preparation used was                            SUPREP via split dose instruction. Scope In: 1:31:06 PM Scope Out: 1:50:45 PM Scope Withdrawal Time: 0 hours 14 minutes 58 seconds  Total Procedure Duration: 0 hours 19 minutes 39 seconds  Findings:                 Multiple diverticula were found in the sigmoid                            colon.                           Internal hemorrhoids were found during retroflexion.                           The exam was otherwise without abnormality on  direct and retroflexion views. Complications:            No immediate complications. Estimated blood loss:                            None. Estimated Blood Loss:     Estimated blood loss: none. Impression:               - Diverticulosis in the sigmoid colon.                           - Internal hemorrhoids.                           - The examination was otherwise normal on direct                            and retroflexion views.                           - No specimens collected. Recommendation:           - Repeat colonoscopy is not recommended for                            screening purposes.                           - Patient has a contact number available for                            emergencies. The signs and symptoms of potential                            delayed complications were discussed with the                             patient. Return to normal activities tomorrow.                            Written discharge instructions were provided to the                            patient.                           - Resume previous diet.                           - Continue present medications. Norleen SAILOR. Abran, MD 02/04/2024 1:58:28 PM This report has been signed electronically.

## 2024-02-04 NOTE — Progress Notes (Signed)
 A/o x 3, VSS, gd SR's, pleased with anesthesia, report to RN

## 2024-02-04 NOTE — Progress Notes (Signed)
Vitals-dt  Pt's states no medical or surgical changes since previsit or office visit.

## 2024-02-05 ENCOUNTER — Telehealth: Payer: Self-pay | Admitting: *Deleted

## 2024-02-05 NOTE — Telephone Encounter (Signed)
 Returned the patient's phone call. Reassured her that it is not unusual to have some watery stool after the procedure and that it takes a few days to get back to a normal. Pt verbalized understanding.

## 2024-02-05 NOTE — Telephone Encounter (Signed)
 No answer on  follow up call. Left message.

## 2024-02-05 NOTE — Telephone Encounter (Signed)
 Patient called stating is having water and watery stool still today. Patient is requesting call back to discuss further. Please advise, Thank you.

## 2024-02-26 ENCOUNTER — Ambulatory Visit: Payer: Self-pay

## 2024-02-26 ENCOUNTER — Telehealth: Admitting: Family Medicine

## 2024-02-26 DIAGNOSIS — R300 Dysuria: Secondary | ICD-10-CM

## 2024-02-26 MED ORDER — CEPHALEXIN 500 MG PO CAPS: 500.0000 mg | ORAL_CAPSULE | Freq: Three times a day (TID) | ORAL | 0 refills | Status: DC

## 2024-02-26 NOTE — Telephone Encounter (Signed)
 FYI Only or Action Required?: FYI only for provider.  Patient was last seen in primary care on 12/25/2023 by [place], Jessica G, MD.  Called Nurse Triage reporting Urinary Frequency.  Symptoms began several days ago.  Interventions attempted: Nothing.  Symptoms are: gradually worsening.  Triage Disposition: See Physician Within 24 Hours  Patient/caregiver understands and will follow disposition?: Yes, will follow disposition  Copied from CRM #[id number]. Topic: Clinical - Red Word Triage >> Feb 26, 2024  2:17 PM Mesmerise C wrote: [facility] that prompted transfer to Nurse Triage: Patient believes she has UTI says it's been ongoing for a couple day, states it's a burning pain when urinating and and frequently needing to urinate, also states an aching in lower abdomen, prefers virtual appointment Reason for Disposition  Urinating more frequently than usual (i.e., frequency) OR new-onset of the feeling of an urgent need to urinate (i.e., urgency)  Answer Assessment - Initial Assessment Questions 1. SYMPTOM: What's the main symptom you're concerned about? (e.Fry., frequency, incontinence)     frequency 2. ONSET: When did the  frequency  start?     yesterday 3. PAIN: Is there any pain? If Yes, ask: How bad is it? (Scale: 1-10; mild, moderate, severe)     Burning with urination, states that she does have an ache in her low belly 4. CAUSE: What do you think is causing the symptoms?     UTI  Protocols used: Urinary Symptoms-A-AH

## 2024-02-26 NOTE — Telephone Encounter (Signed)
 Noted

## 2024-02-26 NOTE — Progress Notes (Signed)
 Patient ID: Jessica Fry, female   DOB: 1956-02-17, 68 y.o.   MRN: [id number]  Virtual Visit via Video Note  I connected with Jessica Fry on 02/26/24 at  5:00 PM EDT by a video enabled telemedicine application and verified that I am speaking with the correct person using two identifiers.  Location patient: home Location provider:work or home office Persons participating in the virtual visit: patient, provider  I discussed the limitations of evaluation and management by telemedicine and the availability of in person appointments. The patient expressed understanding and agreed to proceed.   HPI:  Patient was seen a virtual visit with approximately 2-day history of some urine frequency and burning with urination.  She has had some lower abdominal achy sensation.  Denies any fevers or chills.  Has had some nonspecific malaise.  No nausea or vomiting.  No flank pain.  No gross hematuria.  No recent bladder instrumentation.  No recent UTI.  No known antibiotic allergies.   ROS: See pertinent positives and negatives per HPI.  Past Medical History:  Diagnosis Date   Alcohol addiction (HCC)    Allergy     Chicken pox    Depression    Eczema    GERD (gastroesophageal reflux disease)    Hyperlipidemia    Thyroid  disease    Hypothyroid   Urine incontinence     Past Surgical History:  Procedure Laterality Date   nose-deviated septum injury  1972    Family History  Problem Relation Age of Onset   Early death Mother    Miscarriages / [place] Mother    Alcohol abuse Father    Depression Father    Hearing loss Father    Stroke Father    Early death Brother    Heart attack Maternal Grandmother    Stroke Paternal Grandmother     SOCIAL HX: Non-smoker   Current Outpatient Medications:    BABY ASPIRIN PO, Take 81 mg by mouth every other day., Disp: , Rfl:    cephALEXin  (KEFLEX ) 500 MG capsule, Take 1 capsule (500 mg total) by mouth 3 (three) times daily., Disp: 15  capsule, Rfl: 0   Cholecalciferol (VITAMIN D3 PO), Take 1 tablet by mouth daily. , Disp: , Rfl:    clotrimazole -betamethasone  (LOTRISONE ) cream, Samall amount on lower abdomen daily as needed., Disp: 30 g, Rfl: 1   levothyroxine  (SYNTHROID ) 100 MCG tablet, TAKE 1 AND 1/2 TABLETS BY MOUTH MONDAY AND WEDNESDAY AND 1 TABLET BY MOUTH ALL OTHER DAYS, Disp: 135 tablet, Rfl: 1   loteprednol  (LOTEMAX ) 0.5 % ophthalmic suspension, INSTILL 1 DROP INTO BOTH EYES EVERY DAY, Disp: 5 mL, Rfl: 1   MAGNESIUM PO, Take by mouth daily as needed (constipation relief)., Disp: , Rfl:    nystatin  (MYCOSTATIN /NYSTOP ) powder, Apply 1 Application topically 3 (three) times daily., Disp: 120 g, Rfl: 3   simvastatin  (ZOCOR ) 40 MG tablet, TAKE 1 TABLET BY MOUTH DAILY AT 6 PM., Disp: 90 tablet, Rfl: 3   venlafaxine  XR (EFFEXOR -XR) 75 MG 24 hr capsule, Take 1 capsule (75 mg total) by mouth daily with breakfast., Disp: 90 capsule, Rfl: 1  EXAM:  VITALS per patient if applicable:  GENERAL: alert, oriented, appears well and in no acute distress  HEENT: atraumatic, conjunttiva clear, no obvious abnormalities on inspection of external nose and ears  NECK: normal movements of the head and neck  LUNGS: on inspection no signs of respiratory distress, breathing rate appears normal, no obvious gross SOB, gasping or wheezing  CV: no  obvious cyanosis  MS: moves all visible extremities without noticeable abnormality  PSYCH/NEURO: pleasant and cooperative, no obvious depression or anxiety, speech and thought processing grossly intact  ASSESSMENT AND PLAN:  Discussed the following assessment and plan:  Dysuria.  Patient relates 2-day history of frequency with urination and burning.  Suspect uncomplicated cystitis.  Does not have any fever or flank pain.  She will consider over-the-counter Pyridium for symptom relief.  Start Keflex  500 mg 3 times daily for 5 days.  Follow-up for any persistent or worsening symptoms.     I  discussed the assessment and treatment plan with the patient. The patient was provided an opportunity to ask questions and all were answered. The patient agreed with the plan and demonstrated an understanding of the instructions.   The patient was advised to call back or seek an in-person evaluation if the symptoms worsen or if the condition fails to improve as anticipated.     Wolm Scarlet, MD

## 2024-02-28 ENCOUNTER — Ambulatory Visit: Payer: Self-pay

## 2024-02-28 NOTE — Telephone Encounter (Signed)
 Patient informed of the message below.

## 2024-02-28 NOTE — Telephone Encounter (Signed)
 FYI Only or Action Required?: Action required by provider: update on patient condition.  Patient was last seen in primary care on 02/26/2024 by Jessica Fry ORN, MD.  Called Nurse Triage reporting Back Pain.  Symptoms began today.  Interventions attempted: Prescription medications: kelflex and Rest, hydration, or home remedies.  Symptoms are: unchanged.  Triage Disposition: Home Care  Patient/caregiver understands and will follow disposition?: Yes     Copied from CRM [id number]. Topic: Clinical - Red Word Triage >> Feb 28, 2024 10:02 AM Jessica Fry wrote: Red Word that prompted transfer to Nurse Triage: Pain: Patient is calling because she was seen for a UTI yesterday and is taking the anti-biotic she was prescribed but she now has lower back pain today, patient requested to speak to a nurse. Reason for Disposition  Back pain  Answer Assessment - Initial Assessment Questions 1. ONSET: When did the pain begin? (e.g., minutes, hours, days)     This morning around 6 am 2. LOCATION: Where does it hurt? (upper, mid or lower back)     Right lover back to mid back 3. SEVERITY: How bad is the pain?  (e.g., Scale 1-10; mild, moderate, or severe)     4/10 4. PATTERN: Is the pain constant? (e.g., yes, no; constant, intermittent)      Constant ache 5. RADIATION: Does the pain shoot into your legs or somewhere else?     no 6. CAUSE:  What do you think is causing the back pain?      Uti uti 7. BACK OVERUSE:  Any recent lifting of heavy objects, strenuous work or exercise?     Because  8. MEDICINES: What have you taken so far for the pain? (e.g., nothing, acetaminophen, NSAIDS)     nothing 9. NEUROLOGIC SYMPTOMS: Do you have any weakness, numbness, or problems with bowel/bladder control?     no 10. OTHER SYMPTOMS: Do you have any other symptoms? (e.g., fever, abdomen pain, burning with urination, blood in urine)     Urinary incontinence x 3 days  Protocols used: Back  Pain-A-AH

## 2024-03-11 ENCOUNTER — Ambulatory Visit: Admitting: Family Medicine

## 2024-03-11 ENCOUNTER — Encounter: Payer: Self-pay | Admitting: Family Medicine

## 2024-03-11 VITALS — BP 128/78 | HR 62 | Resp 16 | Ht 66.5 in | Wt 212.0 lb

## 2024-03-11 DIAGNOSIS — F330 Major depressive disorder, recurrent, mild: Secondary | ICD-10-CM

## 2024-03-11 DIAGNOSIS — R41840 Attention and concentration deficit: Secondary | ICD-10-CM | POA: Diagnosis not present

## 2024-03-11 DIAGNOSIS — R350 Frequency of micturition: Secondary | ICD-10-CM

## 2024-03-11 DIAGNOSIS — F1021 Alcohol dependence, in remission: Secondary | ICD-10-CM | POA: Diagnosis not present

## 2024-03-11 NOTE — Progress Notes (Addendum)
 Chief Complaint  Patient presents with   Medical Management of Chronic Issues   Urinary Tract Infection   HPI: [name] is a 68 y.o. female with a PMHx significant for  hypothyroidism, HLD, prediabetes, who is here today for chronic disease management and to address some concerns.  Last seen on 12/25/2023  Depression: Treated with Effexor -XR 75 mg daily She says that she feels like now that her depression is well-controlled but she feels like she is having memory difficulties/decreased concentration.  Describes having a delayed recall in conversation (like when asked how her day was) and having difficulties carrying out a plan. She noted problem recently, which she refers as ADD. The only dementia in her family reportedly affected her father, and this was vascular dementia. She is making an effort to socialized more and going back to activities she enjoys like painting. She feels like she has better copying skills.  Hyperlipidemia treated with Simvastatin  40 mg daily.  Lab Results  Component Value Date   CHOL 166 04/06/2022   HDL 55.00 04/06/2022   LDLCALC 78 04/06/2022   TRIG 164.0 (H) 04/06/2022   CHOLHDL 3 04/06/2022   Urinary frequency Seen by Dr. Wolm Scarlet via telemedicine for Dysuria, for which he prescribed Keflex  500 mg 3 times daily for 5 days in case of infectious origin, but suspected these were symptoms related to uncomplicated cystitis.  She notes leakage when sitting up in bed in the morning.  Negative for dysuria or hematuria. No nocturia. Lab Results  Component Value Date   HGBA1C 6.0 09/10/2023    Review of Systems  Constitutional:  Negative for activity change, appetite change, chills and fever.  HENT:  Negative for mouth sores and sore throat.   Respiratory:  Negative for cough and shortness of breath.   Cardiovascular:  Negative for chest pain, palpitations and leg swelling.  Gastrointestinal:  Negative for abdominal pain,  nausea and vomiting.  Endocrine: Negative for cold intolerance and heat intolerance.  Genitourinary:  Positive for frequency and urgency. Negative for decreased urine volume, flank pain, menstrual problem, vaginal bleeding and vaginal discharge.  Musculoskeletal:  Negative for gait problem and myalgias.  Skin:  Negative for rash.  Neurological:  Negative for syncope and weakness.  Psychiatric/Behavioral:  Negative for confusion and hallucinations.   See other pertinent positives and negatives in HPI.  Current Outpatient Medications on File Prior to Visit  Medication Sig Dispense Refill   BABY ASPIRIN PO Take 81 mg by mouth every other day.     Cholecalciferol (VITAMIN D3 PO) Take 1 tablet by mouth daily.      clotrimazole -betamethasone  (LOTRISONE ) cream Samall amount on lower abdomen daily as needed. 30 g 1   levothyroxine  (SYNTHROID ) 100 MCG tablet TAKE 1 AND 1/2 TABLETS BY MOUTH MONDAY AND WEDNESDAY AND 1 TABLET BY MOUTH ALL OTHER DAYS 135 tablet 1   loteprednol  (LOTEMAX ) 0.5 % ophthalmic suspension INSTILL 1 DROP INTO BOTH EYES EVERY DAY 5 mL 1   MAGNESIUM PO Take by mouth daily as needed (constipation relief).     nystatin  (MYCOSTATIN /NYSTOP ) powder Apply 1 Application topically 3 (three) times daily. 120 g 3   simvastatin  (ZOCOR ) 40 MG tablet TAKE 1 TABLET BY MOUTH DAILY AT 6 PM. 90 tablet 3   venlafaxine  XR (EFFEXOR -XR) 75 MG 24 hr capsule Take 1 capsule (75 mg total) by mouth daily with breakfast. 90 capsule 1   No current facility-administered medications on file prior to visit.    Past Medical  History:  Diagnosis Date   Alcohol addiction (HCC)    Allergy     Chicken pox    Depression    Eczema    GERD (gastroesophageal reflux disease)    Hyperlipidemia    Thyroid  disease    Hypothyroid   Urine incontinence    Allergies  Allergen Reactions   Shellfish-Derived Products Other (See Comments)    Patient preference.     Social History   Socioeconomic History   Marital  status: Divorced    Spouse name: Not on file   Number of children: 2   Years of education: Not on file   Highest education level: Doctorate  Occupational History   Not on file  Tobacco Use   Smoking status: Never    Passive exposure: Never   Smokeless tobacco: Never  Vaping Use   Vaping status: Never Used  Substance and Sexual Activity   Alcohol use: No    Comment: 7 years sober   Drug use: No   Sexual activity: Not Currently    Comment: both previously  Other Topics Concern   Not on file  Social History Narrative   Not on file   Social Drivers of Health   Financial Resource Strain: Low Risk  (02/26/2024)   Overall Financial Resource Strain (CARDIA)    Difficulty of Paying Living Expenses: Not hard at all  Food Insecurity: No Food Insecurity (02/26/2024)   Hunger Vital Sign    Worried About Running Out of Food in the Last Year: Never true    Ran Out of Food in the Last Year: Never true  Transportation Needs: No Transportation Needs (02/26/2024)   PRAPARE - Administrator, Civil Service (Medical): No    Lack of Transportation (Non-Medical): No  Physical Activity: Insufficiently Active (02/26/2024)   Exercise Vital Sign    Days of Exercise per Week: 2 days    Minutes of Exercise per Session: 20 min  Stress: No Stress Concern Present (02/26/2024)   [employer] of Occupational Health - Occupational Stress Questionnaire    Feeling of Stress: Not at all  Social Connections: Moderately Integrated (02/26/2024)   Social Connection and Isolation Panel    Frequency of Communication with Friends and Family: Twice a week    Frequency of Social Gatherings with Friends and Family: Once a week    Attends Religious Services: More than 4 times per year    Active Member of [employer] or Organizations: Yes    Attends Banker Meetings: More than 4 times per year    Marital Status: Divorced    Vitals:   03/11/24 1554  BP: 128/78  Pulse: 62  Resp: 16  SpO2: 97%    Body mass index is 33.71 kg/m.  Physical Exam Vitals and nursing note reviewed.  Constitutional:      General: She is not in acute distress.    Appearance: She is well-developed.  HENT:     Head: Normocephalic and atraumatic.     Mouth/Throat:     Mouth: Mucous membranes are moist.  Eyes:     Conjunctiva/sclera: Conjunctivae normal.  Cardiovascular:     Rate and Rhythm: Normal rate and regular rhythm.     Pulses:          Dorsalis pedis pulses are 2+ on the right side and 2+ on the left side.     Heart sounds: No murmur heard. Pulmonary:     Effort: Pulmonary effort is normal. No respiratory distress.  Breath sounds: Normal breath sounds.  Abdominal:     Palpations: Abdomen is soft. There is no hepatomegaly or mass.     Tenderness: There is no abdominal tenderness.  Lymphadenopathy:     Cervical: No cervical adenopathy.  Skin:    General: Skin is warm.     Findings: No erythema or rash.  Neurological:     General: No focal deficit present.     Mental Status: She is alert and oriented to person, place, and time.     Cranial Nerves: No cranial nerve deficit.     Gait: Gait normal.  Psychiatric:        Mood and Affect: Mood and affect normal.   ASSESSMENT AND PLAN: Ms.Saloni Dione Petron was seen here today for chronic disease management.  Orders Placed This Encounter  Procedures   Urine Culture   Urinalysis with Culture Reflex   REFLEXIVE URINE CULTURE   Difficulty concentrating We discussed possible etiologies, depression can also affect concentration. She mentions ADD but for now she is not interested in psychiatry referral for screening and treatment. Would like to see if this improves after decreasing dose of Effexor .  Alcohol dependence, in remission [facility]) Assessment & Plan: She has been in remission for years. Attending AA meetings 2-3 times per year.  Urinary frequency With mild stress incontinence and no other associated sx. Completed abx  treatment in 02/2024. We discussed possible causes. Hx is not very suggestive of UTI. ? Overactive bladder. Recommend for now Kegel and pelvic floor exercises. Will plan on re-checking HgA1C next visit. Further recommendations according to UA results.  -     Urine Culture -     REFLEXIVE URINE CULTURE  Mild episode of recurrent major depressive disorder [facility]) Assessment & Plan: She reports problem as well controlled and has been so for a 1-2 years. She wonders if she can try to decrease medication dose. Will try decreasing slowly from 75 mg to 37.5 mg as instructed on AVS. F/U in 6 weeks, before if needed.  Return in about 6 weeks (around 04/22/2024).  I,Emily Lagle,acting as a neurosurgeon for Natalyn Szymanowski, MD.,have documented all relevant documentation on the behalf of Noble Cicalese, MD,as directed by  Willman Cuny, MD while in the presence of Zion Lint, MD.   I, Leo Weyandt, MD, have reviewed all documentation for this visit. The documentation on 03/12/24 for the exam, diagnosis, procedures, and orders are all accurate and complete.  Jiovanna Frei G. Murna Backer, MD  [facility]. [place] [facility].

## 2024-03-11 NOTE — Patient Instructions (Signed)
 A few things to remember from today's visit:  Mild episode of recurrent major depressive disorder (HCC) - Plan: Urinalysis with Culture Reflex Let decrease Effexor  dose, alternate between 37.5 mg and 75 mg every other day for 4 weeks then continue 37.5 mg daily. I see you in 6 weeks.  If you need refills for medications you take chronically, please call your pharmacy. Do not use My Chart to request refills or for acute issues that need immediate attention. If you send a my chart message, it may take a few days to be addressed, specially if I am not in the office.  Please be sure medication list is accurate. If a new problem present, please set up appointment sooner than planned today.

## 2024-03-12 ENCOUNTER — Telehealth: Payer: Self-pay | Admitting: Family Medicine

## 2024-03-12 NOTE — Assessment & Plan Note (Signed)
 She has been in remission for years. Attending AA meetings 2-3 times per year.

## 2024-03-12 NOTE — Assessment & Plan Note (Signed)
 She reports problem as well controlled and has been so for a 1-2 years. She wonders if she can try to decrease medication dose. Will try decreasing slowly from 75 mg to 37.5 mg as instructed on AVS. F/U in 6 weeks, before if needed.

## 2024-03-12 NOTE — Telephone Encounter (Unsigned)
 Copied from CRM [id number]. Topic: Clinical - Medication Question >> Mar 12, 2024 11:37 AM Gennette ORN wrote: Reason for CRM: Patient is calling to see when the prescription of venlafaxine  XR (EFFEXOR -XR) 75 MG 24 hr capsule will be decreased and sent to pharmacy she needs it.

## 2024-03-13 LAB — URINE CULTURE
MICRO NUMBER:: 16920675
Result:: NO GROWTH
SPECIMEN QUALITY:: ADEQUATE

## 2024-03-13 LAB — URINALYSIS W MICROSCOPIC + REFLEX CULTURE
Bacteria, UA: NONE SEEN /HPF
Bilirubin Urine: NEGATIVE
Glucose, UA: NEGATIVE
Hgb urine dipstick: NEGATIVE
Hyaline Cast: NONE SEEN /LPF
Ketones, ur: NEGATIVE
Nitrites, Initial: NEGATIVE
Protein, ur: NEGATIVE
RBC / HPF: NONE SEEN /HPF (ref 0–2)
Specific Gravity, Urine: 1.014 (ref 1.001–1.035)
pH: 5.5 (ref 5.0–8.0)

## 2024-03-13 LAB — CULTURE INDICATED

## 2024-03-13 MED ORDER — VENLAFAXINE HCL ER 37.5 MG PO CP24
37.5000 mg | ORAL_CAPSULE | Freq: Every day | ORAL | 2 refills | Status: AC
Start: 2024-03-13 — End: ?

## 2024-03-13 NOTE — Telephone Encounter (Signed)
 Rx sent.

## 2024-03-14 ENCOUNTER — Ambulatory Visit: Payer: Self-pay | Admitting: Family Medicine

## 2024-04-03 ENCOUNTER — Telehealth: Payer: Self-pay | Admitting: Family Medicine

## 2024-04-03 DIAGNOSIS — G8929 Other chronic pain: Secondary | ICD-10-CM

## 2024-04-03 NOTE — Telephone Encounter (Signed)
 Copied from CRM #[id number]. Topic: Referral - Request for Referral >> Apr 02, 2024  4:00 PM Thersia BROCKS wrote: Did the patient discuss referral with their provider in the last year? Yes (If No - schedule appointment) (If Yes - send message)  Appointment offered? Yes  Type of order/referral and detailed reason for visit: Physical Therapy   Preference of office, provider, location:  [facility] [address] [place], [place] [address] Phone [phone] Fax [phone] If referral order, have you been seen by this specialty before? Yes (If Yes, this issue or another issue? When? Where?  Can we respond through MyChart? Yes

## 2024-04-11 NOTE — Telephone Encounter (Signed)
 Last visit she was c/o urinary frequency and some incontinence. We discussed possible benefits of pelvic floor PT. Is this what she is referring to?  Thanks, BJ

## 2024-04-13 NOTE — Telephone Encounter (Signed)
 Patient states she has past left knee injury and completed PT in the past. Patient states she is trying to walk more for her physical and mental health and is unable to walk for long distances due to pain and tightening in the left knee.

## 2024-04-21 NOTE — Telephone Encounter (Signed)
 It is okay to place order for PT as requested. Thanks, BJ

## 2024-04-22 NOTE — Telephone Encounter (Signed)
 Patient notified referral for PT has been placed. Patient voiced understanding.

## 2024-04-28 ENCOUNTER — Telehealth: Payer: Self-pay | Admitting: Family Medicine

## 2024-04-28 ENCOUNTER — Encounter: Payer: Self-pay | Admitting: Family Medicine

## 2024-04-28 ENCOUNTER — Ambulatory Visit: Admitting: Family Medicine

## 2024-04-28 ENCOUNTER — Ambulatory Visit: Payer: Self-pay

## 2024-04-28 VITALS — BP 128/78 | HR 63 | Temp 97.8°F | Resp 16 | Ht 66.5 in | Wt 211.8 lb

## 2024-04-28 DIAGNOSIS — R1024 Suprapubic pain: Secondary | ICD-10-CM

## 2024-04-28 DIAGNOSIS — N816 Rectocele: Secondary | ICD-10-CM

## 2024-04-28 DIAGNOSIS — R32 Unspecified urinary incontinence: Secondary | ICD-10-CM | POA: Diagnosis not present

## 2024-04-28 DIAGNOSIS — N8110 Cystocele, unspecified: Secondary | ICD-10-CM | POA: Diagnosis not present

## 2024-04-28 LAB — POCT URINALYSIS DIPSTICK
Glucose, UA: NEGATIVE
Ketones, UA: 5
Leukocytes, UA: NEGATIVE
Nitrite, UA: NEGATIVE
Protein, UA: POSITIVE — AB
Spec Grav, UA: >=1.03 — AB (ref 1.010–1.025)
Urobilinogen, UA: 0.2 U/dL
pH, UA: 6 (ref 5.0–8.0)

## 2024-04-28 MED ORDER — SULFAMETHOXAZOLE-TRIMETHOPRIM 800-160 MG PO TABS: 1.0000 | ORAL_TABLET | Freq: Two times a day (BID) | ORAL | 0 refills | Status: AC

## 2024-04-28 NOTE — Telephone Encounter (Signed)
 Patient states she has an appointment at 1600 with provider. Patient states she is having urinary incontinence and bladder pain. Patient states she will wait to see provider for further evaluation.

## 2024-04-28 NOTE — Patient Instructions (Addendum)
 A few things to remember from today's visit:  Urinary incontinence, unspecified type - Plan: POCT Urinalysis Dipstick, Ambulatory referral to Urology, CANCELED: Urine Culture  Cystocele with rectocele - Plan: Ambulatory referral to Urology Start take an antibiotic, Bactrim twice daily for 3 days. Continue adequate hydration.  If you need refills for medications you take chronically, please call your pharmacy. Do not use My Chart to request refills or for acute issues that need immediate attention. If you send a my chart message, it may take a few days to be addressed, specially if I am not in the office.  Please be sure medication list is accurate. If a new problem present, please set up appointment sooner than planned today.

## 2024-04-28 NOTE — Telephone Encounter (Signed)
 Copied from CRM [id number]. Topic: Appointments - Scheduling Inquiry for Clinic >> Apr 28, 2024 12:28 PM Ashley R wrote: Reason for CRM: Only wants to speak with a woman. Able to schedule online but has questions - Nurse/CMA callback [phone]

## 2024-04-28 NOTE — Progress Notes (Signed)
 ACUTE VISIT Chief Complaint  Patient presents with   Acute Visit    Urinary incontinence, urgency and leaking for two weeks    Discussed the use of AI scribe software for clinical note transcription with the patient, who gave verbal consent to proceed.  History of Present Illness Latrelle Fuston is a 68 year old female with past medical history significant for hypothyroidism, hyperlipidemia, prediabetes, and depression who presents with worsening urinary symptoms as described above. She was last seen on 03/11/2024, when she was complaining of urinary frequency.  She was previously treated with cephalexin  500 mg 3 times daily for 5 days.  She has experienced worsening urinary symptoms since her last visit,, including difficulty urinating with minimal urine output and sharp pain radiating from the lower abdomen upwards towards lower chest and one time to left arm and fingers while straining. She denies any exertional CT,SOB,or palpitations when exercising.  She experiences urinary incontinence, particularly when lying down, with involuntary urine leakage. She urinates four to five times a day with minimal volume.  No hematuria or dysuria, but discomfort is noted during straining. The symptoms of needing to push to urinate have been present for at least a week, and she associates the pain with this straining. She feels some relief after urination but continues to feel the urge to go.  She has not noticed any masses or lumps in the genital area. She feels a constant urge to urinate and a sensation of fullness, impacting her ability to work due to leakage and pain.  Review of Systems  Constitutional:  Negative for appetite change, chills, diaphoresis and fever.  HENT:  Negative for sore throat and trouble swallowing.   Respiratory:  Negative for cough.   Cardiovascular:  Negative for leg swelling.  Gastrointestinal:  Negative for nausea and vomiting.  Genitourinary:  Positive for  difficulty urinating. Negative for flank pain, vaginal bleeding, vaginal discharge and vaginal pain.  Skin:  Negative for rash.  Neurological:  Negative for syncope and weakness.  See other pertinent positives and negatives in HPI.  Current Outpatient Medications on File Prior to Visit  Medication Sig Dispense Refill   BABY ASPIRIN PO Take 81 mg by mouth every other day.     Cholecalciferol (VITAMIN D3 PO) Take 1 tablet by mouth daily.      clotrimazole -betamethasone  (LOTRISONE ) cream Samall amount on lower abdomen daily as needed. 30 g 1   levothyroxine  (SYNTHROID ) 100 MCG tablet TAKE 1 AND 1/2 TABLETS BY MOUTH MONDAY AND WEDNESDAY AND 1 TABLET BY MOUTH ALL OTHER DAYS 135 tablet 1   loteprednol  (LOTEMAX ) 0.5 % ophthalmic suspension INSTILL 1 DROP INTO BOTH EYES EVERY DAY 5 mL 1   MAGNESIUM PO Take by mouth daily as needed (constipation relief).     nystatin  (MYCOSTATIN /NYSTOP ) powder Apply 1 Application topically 3 (three) times daily. 120 g 3   simvastatin  (ZOCOR ) 40 MG tablet TAKE 1 TABLET BY MOUTH DAILY AT 6 PM. 90 tablet 3   venlafaxine  XR (EFFEXOR  XR) 37.5 MG 24 hr capsule Take 1 capsule (37.5 mg total) by mouth daily with breakfast. 90 capsule 2   venlafaxine  XR (EFFEXOR -XR) 75 MG 24 hr capsule Take 1 capsule (75 mg total) by mouth daily with breakfast. 90 capsule 1   No current facility-administered medications on file prior to visit.   Past Medical History:  Diagnosis Date   Alcohol addiction (HCC)    Allergy     Chicken pox    Depression  Eczema    GERD (gastroesophageal reflux disease)    Hyperlipidemia    Thyroid  disease    Hypothyroid   Urine incontinence    Allergies  Allergen Reactions   Shellfish Protein-Containing Drug Products Other (See Comments)    Patient preference.     Social History   Socioeconomic History   Marital status: Divorced    Spouse name: Not on file   Number of children: 2   Years of education: Not on file   Highest education level:  Doctorate  Occupational History   Not on file  Tobacco Use   Smoking status: Never    Passive exposure: Never   Smokeless tobacco: Never  Vaping Use   Vaping status: Never Used  Substance and Sexual Activity   Alcohol use: No    Comment: 7 years sober   Drug use: No   Sexual activity: Not Currently    Comment: both previously  Other Topics Concern   Not on file  Social History Narrative   Not on file   Social Drivers of Health   Financial Resource Strain: Low Risk  (02/26/2024)   Overall Financial Resource Strain (CARDIA)    Difficulty of Paying Living Expenses: Not hard at all  Food Insecurity: No Food Insecurity (02/26/2024)   Hunger Vital Sign    Worried About Running Out of Food in the Last Year: Never true    Ran Out of Food in the Last Year: Never true  Transportation Needs: No Transportation Needs (02/26/2024)   PRAPARE - Administrator, Civil Service (Medical): No    Lack of Transportation (Non-Medical): No  Physical Activity: Insufficiently Active (02/26/2024)   Exercise Vital Sign    Days of Exercise per Week: 2 days    Minutes of Exercise per Session: 20 min  Stress: No Stress Concern Present (02/26/2024)   [employer] of Occupational Health - Occupational Stress Questionnaire    Feeling of Stress: Not at all  Social Connections: Moderately Integrated (02/26/2024)   Social Connection and Isolation Panel    Frequency of Communication with Friends and Family: Twice a week    Frequency of Social Gatherings with Friends and Family: Once a week    Attends Religious Services: More than 4 times per year    Active Member of [employer] or Organizations: Yes    Attends Banker Meetings: More than 4 times per year    Marital Status: Divorced   Vitals:   04/28/24 1603  BP: 128/78  Pulse: 63  Resp: 16  Temp: 97.8 F (36.6 C)  SpO2: 97%   Body mass index is 33.67 kg/m.  Physical Exam Vitals and nursing note reviewed. Exam conducted with a  chaperone present.  Constitutional:      General: She is not in acute distress.    Appearance: She is well-developed.  HENT:     Head: Normocephalic and atraumatic.  Eyes:     Conjunctiva/sclera: Conjunctivae normal.  Cardiovascular:     Rate and Rhythm: Normal rate and regular rhythm.  Pulmonary:     Effort: Pulmonary effort is normal. No respiratory distress.     Breath sounds: Normal breath sounds.  Abdominal:     Palpations: Abdomen is soft. There is no mass.     Tenderness: There is no abdominal tenderness.  Genitourinary:    Exam position: Lithotomy position.     Labia:        Right: No rash, tenderness or lesion.  Left: No rash, tenderness or lesion.      Vagina: No vaginal discharge, erythema, tenderness or bleeding.  Skin:    General: Skin is warm.     Findings: No erythema.  Neurological:     Mental Status: She is alert and oriented to person, place, and time.  Psychiatric:        Mood and Affect: Mood and affect normal.   ASSESSMENT AND PLAN:  Ms. Fleisher was seen today for 2 weeks of symptoms of urinary retention, incontinence, and suprapubic abdominal pain.  Cystocele with rectocele This problem could explain most of her symptoms, difficulty starting urination, urinary obstruction, with episodes of incontinence. We discussed diagnosis, prognosis, and treatment options. So far she has been able to urinate, a small amount at the time. She was clearly instructed about warning signs. Appointment with urogynecology will be arranged.  -     Ambulatory referral to Urology  Urinary incontinence, unspecified type Spontaneous urine leakage when lying down but difficulty starting urination when voiding.  At this point I do not think Kegel exercises are going to be very effective but recommend continuing exercises. Avoid constipation and avoid straining.  -     POCT urinalysis dipstick -     Ambulatory referral to Urology  Suprapubic abdominal pain In  02/2024 she was treated with cephalexin  for possible UTI. 03/11/24 we decided to hold on antibiotics, urine culture showed no growth. Urine dipstick today  with positive protein and some ketones. Urine was not sent for Cx. Because new onset of suprapubic abdominal pain, we decided to start empiric antibiotic treatment for possible cystitis.  Bactrim DS twice daily for 3 days. Monitor for new symptoms.  Other orders -     Sulfamethoxazole-Trimethoprim; Take 1 tablet by mouth 2 (two) times daily for 3 days.  Dispense: 6 tablet; Refill: 0  Return if symptoms worsen or fail to improve, for keep next appointment.  Xoie Kreuser G. [place], MD  [facility]. [place] office.

## 2024-04-28 NOTE — Telephone Encounter (Signed)
 FYI Only or Action Required?: FYI only for provider.  Patient was last seen in primary care on 03/11/2024 by [place], Betty G, MD.  Called Nurse Triage reporting Urinary Frequency.  Symptoms began a week ago.  Interventions attempted: Nothing.  Symptoms are: gradually worsening.  Triage Disposition: See Physician Within 24 Hours  Patient/caregiver understands and will follow disposition?: Yes Reason for Disposition  Age > 50 years  Answer Assessment - Initial Assessment Questions Patient states she would like a referral possibly to see what's going on. The problem is getting worse and not going away.  1. SEVERITY: How bad is the pain?  (e.g., Scale 1-10; mild, moderate, or severe)     Burning with urination that feeling radiates into chest and fingertips   2. PATTERN: Is pain present every time you urinate or just sometimes?      Yes  3. ONSET: When did the painful urination start?      5 days ago  4. FEVER: Do you have a fever? If Yes, ask: What is your temperature, how was it measured, and when did it start?     Denies  5. CAUSE: What do you think is causing the painful urination?  (e.g., UTI, scratch, Herpes sore)     Unsure  6. OTHER SYMPTOMS: Do you have any other symptoms? (e.g., blood in urine, flank pain, genital sores, urgency, vaginal discharge)     Urinary incontinence every morning and sometimes during the day, can feel something in the entrance of vagina that has to lift up when having a bowel movement  Protocols used: Urination Pain - [facility]  Copied from CRM [id number]. Topic: Clinical - Red Word Triage >> Apr 28, 2024  1:30 PM Rosina BIRCH wrote: Red Word that prompted transfer to Nurse Triage: urine incontinence and burning and the pain goes into her chest and fingers when she urinates. Patient has pressure, stool hanging low and pelvic floor is not there

## 2024-05-26 ENCOUNTER — Telehealth (INDEPENDENT_AMBULATORY_CARE_PROVIDER_SITE_OTHER): Admitting: Family Medicine

## 2024-05-26 ENCOUNTER — Encounter: Payer: Self-pay | Admitting: Family Medicine

## 2024-05-26 VITALS — Ht 66.0 in | Wt 210.0 lb

## 2024-05-26 DIAGNOSIS — M545 Low back pain, unspecified: Secondary | ICD-10-CM | POA: Diagnosis not present

## 2024-05-26 DIAGNOSIS — N816 Rectocele: Secondary | ICD-10-CM | POA: Diagnosis not present

## 2024-05-26 DIAGNOSIS — R1024 Suprapubic pain: Secondary | ICD-10-CM | POA: Diagnosis not present

## 2024-05-26 DIAGNOSIS — N8110 Cystocele, unspecified: Secondary | ICD-10-CM

## 2024-05-26 NOTE — Progress Notes (Signed)
 Virtual Visit via Video Note I connected with Jessica Fry on 05/26/24 by a video enabled telemedicine application and verified that I am speaking with the correct person using two identifiers. Location patient: home Location provider:work office Persons participating in the virtual visit: patient, provider  I discussed the limitations of evaluation and management by telemedicine and the availability of in person appointments. The patient expressed understanding and agreed to proceed.  Chief Complaint  Patient presents with   Medical Management of Chronic Issues    States more pee in morning and at night before she gets to the bathroom. States lower back and belly ache. Appointment with Urologist December 29.    Discussed the use of AI scribe software for clinical note transcription with the patient, who gave verbal consent to proceed. History of Present Illness Jessica Fry is a 68 year old female with past medical history significant for hyperlipidemia, hypothyroidism, depression, prediabetes, and OA who is being seen on video today for lower back and suprapubic abdominal discomfort as well as urine leakage as described above.  She was evaluated for similar symptoms on 03/11/2024, when she was concerned about possible UTI.  Urine culture was negative. Treated with cephalexin  for UTI on 02/26/2024. On 04/28/2024 she reported worsening urinary symptoms, including difficulty urinating, sharp pain radiating from lower abdomen to lower back.  Examination revealed a cystocele with rectocele.  At that time she was also empirically treated with Bactrim DS.  She experiences a dull, constant ache in her lower back and suprapubic discomfort. States that symptoms are not as severe as they were previously.   Pain is not radiated, she has not identified exacerbating or alleviating factors. Negative for vaginal bleeding or discharge.  She has not noted fever, chills, nausea,  vomiting, or changes in bowel habits. She has experienced pinkish discoloration in her underwear, .She recalls a similar occurrence in October when she had bladder leakage. No burning sensation during urination or changes in urinary frequency since September. However, she sometimes experiences variable urine output, with small amounts at times and larger amounts at others, without a corresponding increase in fluid intake.  She completed PT for knee and hip pain, she noticed some improvement of urinary symptoms during this time, reoccurred after stopping PT. She reports that lying flat at night seems to alleviate pressure.  ROS: See pertinent positives and negatives per HPI.  Past Medical History:  Diagnosis Date   Alcohol addiction (HCC)    Allergy     Chicken pox    Depression    Eczema    GERD (gastroesophageal reflux disease)    Hyperlipidemia    Thyroid  disease    Hypothyroid   Urine incontinence     Past Surgical History:  Procedure Laterality Date   nose-deviated septum injury  1972    Family History  Problem Relation Age of Onset   Early death Mother    Miscarriages / Stillbirths Mother    Alcohol abuse Father    Depression Father    Hearing loss Father    Stroke Father    Early death Brother    Heart attack Maternal Grandmother    Stroke Maternal Grandmother    Stroke Paternal Grandmother     Social History   Socioeconomic History   Marital status: Divorced    Spouse name: Not on file   Number of children: 2   Years of education: Not on file   Highest education level: Doctorate  Occupational History   Not  on file  Tobacco Use   Smoking status: Never    Passive exposure: Never   Smokeless tobacco: Never  Vaping Use   Vaping status: Never Used  Substance and Sexual Activity   Alcohol use: No    Comment: 7 years sober   Drug use: No   Sexual activity: Not Currently    Comment: both previously  Other Topics Concern   Not on file  Social History  Narrative   Not on file   Social Drivers of Health   Financial Resource Strain: Low Risk  (02/26/2024)   Overall Financial Resource Strain (CARDIA)    Difficulty of Paying Living Expenses: Not hard at all  Food Insecurity: No Food Insecurity (02/26/2024)   Hunger Vital Sign    Worried About Running Out of Food in the Last Year: Never true    Ran Out of Food in the Last Year: Never true  Transportation Needs: No Transportation Needs (02/26/2024)   PRAPARE - Administrator, Civil Service (Medical): No    Lack of Transportation (Non-Medical): No  Physical Activity: Insufficiently Active (02/26/2024)   Exercise Vital Sign    Days of Exercise per Week: 2 days    Minutes of Exercise per Session: 20 min  Stress: No Stress Concern Present (02/26/2024)   [employer] of Occupational Health - Occupational Stress Questionnaire    Feeling of Stress: Not at all  Social Connections: Moderately Integrated (02/26/2024)   Social Connection and Isolation Panel    Frequency of Communication with Friends and Family: Twice a week    Frequency of Social Gatherings with Friends and Family: Once a week    Attends Religious Services: More than 4 times per year    Active Member of [employer] or Organizations: Yes    Attends Banker Meetings: More than 4 times per year    Marital Status: Divorced  Intimate Partner Violence: Not At Risk (10/24/2023)   Humiliation, Afraid, Rape, and Kick questionnaire    Fear of Current or Ex-Partner: No    Emotionally Abused: No    Physically Abused: No    Sexually Abused: No    Current Outpatient Medications:    BABY ASPIRIN PO, Take 81 mg by mouth every other day., Disp: , Rfl:    Cholecalciferol (VITAMIN D3 PO), Take 1 tablet by mouth daily. , Disp: , Rfl:    clotrimazole -betamethasone  (LOTRISONE ) cream, Samall amount on lower abdomen daily as needed., Disp: 30 g, Rfl: 1   levothyroxine  (SYNTHROID ) 100 MCG tablet, TAKE 1 AND 1/2 TABLETS BY MOUTH  MONDAY AND WEDNESDAY AND 1 TABLET BY MOUTH ALL OTHER DAYS, Disp: 135 tablet, Rfl: 1   loteprednol  (LOTEMAX ) 0.5 % ophthalmic suspension, INSTILL 1 DROP INTO BOTH EYES EVERY DAY, Disp: 5 mL, Rfl: 1   MAGNESIUM PO, Take by mouth daily as needed (constipation relief)., Disp: , Rfl:    nystatin  (MYCOSTATIN /NYSTOP ) powder, Apply 1 Application topically 3 (three) times daily., Disp: 120 g, Rfl: 3   simvastatin  (ZOCOR ) 40 MG tablet, TAKE 1 TABLET BY MOUTH DAILY AT 6 PM., Disp: 90 tablet, Rfl: 3   venlafaxine  XR (EFFEXOR  XR) 37.5 MG 24 hr capsule, Take 1 capsule (37.5 mg total) by mouth daily with breakfast., Disp: 90 capsule, Rfl: 2   venlafaxine  XR (EFFEXOR -XR) 75 MG 24 hr capsule, Take 1 capsule (75 mg total) by mouth daily with breakfast., Disp: 90 capsule, Rfl: 1  EXAM:  VITALS per patient if applicable:Ht 5' 6 (1.676 m)  Wt 210 lb (95.3 kg)   BMI 33.89 kg/m   GENERAL: alert, oriented, appears well and in no acute distress  HEENT: atraumatic, conjunctiva clear, no obvious abnormalities on inspection of external nose and ears  NECK: normal movements of the head and neck  LUNGS: on inspection no signs of respiratory distress, breathing rate appears normal, no obvious gross SOB, gasping or wheezing  CV: no obvious cyanosis  MS: moves all visible extremities without noticeable abnormality  PSYCH/NEURO: pleasant and cooperative, no obvious depression or anxiety, speech and thought processing grossly intact  ASSESSMENT AND PLAN:  Discussed the following assessment and plan:  Bilateral low back pain without sciatica, unspecified chronicity Could be musculoskeletal. Lumbar x-ray done in 07/2022 due to several months of lower back pain showed minimal degenerative joint changes of lower lumbar spine. I do not think imaging is needed at this time. Monitor for new symptoms.  Suprapubic pressure We discussed differential diagnosis.  She reports the pain is not as severe as it was in  03/2024. She is also reporting some pinkish discoloration on her underwear associated with urine leakage.  We discussed options, which include waiting to see urologist or bring in a sample for urine culture. For now she prefers the former one but will call to arrange lab appt for UA and UC reflex if new symptoms present or she is still concerned about possible UTI. Given the fact she has been treated for possible UTI with antibiotic twice with a previously negative urine culture, I do not think abx treatmetn is appropriate at this time. Instructed about warning signs.  Cystocele with rectocele She has had intermittent urinary symptoms since 02/2024.  She has been treated with antibiotic for possible UTI with cephalexin  and Bactrim  DS. Urine culture negative in 03/2024. Symptoms she reported today could be related with this problem. She has appointment with urology gynecologist in 06/2024.  We discussed possible serious and likely etiologies, options for evaluation and workup, limitations of telemedicine visit vs in person visit, treatment, treatment risks and precautions. The patient was advised to call back or seek an in-person evaluation if the symptoms worsen or if the condition fails to improve as anticipated. I discussed the assessment and treatment plan with the patient. The patient was provided an opportunity to ask questions and all were answered. The patient agreed with the plan and demonstrated an understanding of the instructions.  Return if symptoms worsen or fail to improve, for keep next appointment.  Arlanda Shiplett, MD

## 2024-06-07 ENCOUNTER — Other Ambulatory Visit: Payer: Self-pay | Admitting: Family Medicine

## 2024-06-07 DIAGNOSIS — E039 Hypothyroidism, unspecified: Secondary | ICD-10-CM
